# Patient Record
Sex: Male | Born: 1935 | Race: Black or African American | Hispanic: No | Smoking: Current some day smoker
Health system: Southern US, Community
[De-identification: ages and names within clinical notes are randomized; demographics above are authoritative.]

## PROBLEM LIST (undated history)

## (undated) DIAGNOSIS — I739 Peripheral vascular disease, unspecified: Secondary | ICD-10-CM

## (undated) DIAGNOSIS — M109 Gout, unspecified: Secondary | ICD-10-CM

## (undated) DIAGNOSIS — I5030 Unspecified diastolic (congestive) heart failure: Secondary | ICD-10-CM

## (undated) DIAGNOSIS — M1990 Unspecified osteoarthritis, unspecified site: Secondary | ICD-10-CM

## (undated) DIAGNOSIS — E785 Hyperlipidemia, unspecified: Secondary | ICD-10-CM

## (undated) DIAGNOSIS — C801 Malignant (primary) neoplasm, unspecified: Secondary | ICD-10-CM

## (undated) DIAGNOSIS — Z9849 Cataract extraction status, unspecified eye: Secondary | ICD-10-CM

## (undated) DIAGNOSIS — R972 Elevated prostate specific antigen [PSA]: Secondary | ICD-10-CM

## (undated) DIAGNOSIS — N419 Inflammatory disease of prostate, unspecified: Secondary | ICD-10-CM

## (undated) DIAGNOSIS — N178 Other acute kidney failure: Secondary | ICD-10-CM

## (undated) DIAGNOSIS — Z8521 Personal history of malignant neoplasm of larynx: Secondary | ICD-10-CM

## (undated) DIAGNOSIS — R627 Adult failure to thrive: Secondary | ICD-10-CM

## (undated) DIAGNOSIS — I10 Essential (primary) hypertension: Secondary | ICD-10-CM

## (undated) DIAGNOSIS — K270 Acute peptic ulcer, site unspecified, with hemorrhage: Secondary | ICD-10-CM

## (undated) DIAGNOSIS — Z9861 Coronary angioplasty status: Secondary | ICD-10-CM

## (undated) DIAGNOSIS — J811 Chronic pulmonary edema: Secondary | ICD-10-CM

## (undated) HISTORY — DX: Coronary angioplasty status: Z98.61

## (undated) HISTORY — DX: Personal history of malignant neoplasm of larynx: Z85.21

## (undated) HISTORY — DX: Inflammatory disease of prostate, unspecified: N41.9

## (undated) HISTORY — PX: CATARACT EXTRACTION: SUR2

## (undated) HISTORY — DX: Unspecified osteoarthritis, unspecified site: M19.90

## (undated) HISTORY — DX: Cataract extraction status, unspecified eye: Z98.49

## (undated) HISTORY — DX: Acute peptic ulcer, site unspecified, with hemorrhage: K27.0

## (undated) HISTORY — DX: Elevated prostate specific antigen (PSA): R97.20

## (undated) HISTORY — PX: OTHER SURGICAL HISTORY: SHX169

## (undated) HISTORY — DX: Peripheral vascular disease, unspecified: I73.9

## (undated) HISTORY — DX: Essential (primary) hypertension: I10

## (undated) HISTORY — DX: Other acute kidney failure: N17.8

## (undated) HISTORY — DX: Gout, unspecified: M10.9

---

## 1999-01-26 ENCOUNTER — Emergency Department (HOSPITAL_COMMUNITY): Admission: EM | Admit: 1999-01-26 | Discharge: 1999-01-26 | Payer: Self-pay | Admitting: Emergency Medicine

## 1999-01-26 ENCOUNTER — Encounter: Payer: Self-pay | Admitting: Emergency Medicine

## 1999-05-14 ENCOUNTER — Encounter: Payer: Self-pay | Admitting: Internal Medicine

## 2002-02-22 ENCOUNTER — Emergency Department (HOSPITAL_COMMUNITY): Admission: EM | Admit: 2002-02-22 | Discharge: 2002-02-22 | Payer: Self-pay | Admitting: Emergency Medicine

## 2003-02-08 ENCOUNTER — Encounter: Admission: RE | Admit: 2003-02-08 | Discharge: 2003-02-08 | Payer: Self-pay | Admitting: Otolaryngology

## 2003-02-08 ENCOUNTER — Encounter: Payer: Self-pay | Admitting: Otolaryngology

## 2003-02-13 ENCOUNTER — Encounter (INDEPENDENT_AMBULATORY_CARE_PROVIDER_SITE_OTHER): Payer: Self-pay | Admitting: *Deleted

## 2003-02-13 ENCOUNTER — Ambulatory Visit (HOSPITAL_BASED_OUTPATIENT_CLINIC_OR_DEPARTMENT_OTHER): Admission: RE | Admit: 2003-02-13 | Discharge: 2003-02-13 | Payer: Self-pay | Admitting: Otolaryngology

## 2003-02-21 ENCOUNTER — Ambulatory Visit: Admission: RE | Admit: 2003-02-21 | Discharge: 2003-05-06 | Payer: Self-pay | Admitting: Radiation Oncology

## 2003-02-28 ENCOUNTER — Ambulatory Visit (HOSPITAL_COMMUNITY): Admission: RE | Admit: 2003-02-28 | Discharge: 2003-02-28 | Payer: Self-pay | Admitting: Radiation Oncology

## 2003-05-29 ENCOUNTER — Ambulatory Visit: Admission: RE | Admit: 2003-05-29 | Discharge: 2003-05-29 | Payer: Self-pay

## 2003-07-31 ENCOUNTER — Ambulatory Visit: Admission: RE | Admit: 2003-07-31 | Discharge: 2003-07-31 | Payer: Self-pay | Admitting: Radiation Oncology

## 2003-08-09 ENCOUNTER — Ambulatory Visit (HOSPITAL_COMMUNITY): Admission: RE | Admit: 2003-08-09 | Discharge: 2003-08-09 | Payer: Self-pay | Admitting: Radiation Oncology

## 2003-09-03 ENCOUNTER — Ambulatory Visit: Admission: RE | Admit: 2003-09-03 | Discharge: 2003-09-03 | Payer: Self-pay | Admitting: Radiation Oncology

## 2003-10-02 ENCOUNTER — Ambulatory Visit (HOSPITAL_COMMUNITY): Admission: RE | Admit: 2003-10-02 | Discharge: 2003-10-02 | Payer: Self-pay | Admitting: Cardiovascular Disease

## 2003-11-12 ENCOUNTER — Ambulatory Visit: Admission: RE | Admit: 2003-11-12 | Discharge: 2003-11-12 | Payer: Self-pay | Admitting: Radiation Oncology

## 2003-12-11 ENCOUNTER — Ambulatory Visit (HOSPITAL_COMMUNITY): Admission: RE | Admit: 2003-12-11 | Discharge: 2003-12-12 | Payer: Self-pay | Admitting: Cardiovascular Disease

## 2003-12-17 ENCOUNTER — Ambulatory Visit: Admission: RE | Admit: 2003-12-17 | Discharge: 2003-12-17 | Payer: Self-pay | Admitting: Radiation Oncology

## 2003-12-24 ENCOUNTER — Ambulatory Visit: Admission: RE | Admit: 2003-12-24 | Discharge: 2003-12-24 | Payer: Self-pay | Admitting: Radiation Oncology

## 2004-02-18 ENCOUNTER — Ambulatory Visit: Admission: RE | Admit: 2004-02-18 | Discharge: 2004-02-18 | Payer: Self-pay | Admitting: Radiation Oncology

## 2004-06-25 ENCOUNTER — Ambulatory Visit: Admission: RE | Admit: 2004-06-25 | Discharge: 2004-06-25 | Payer: Self-pay | Admitting: Radiation Oncology

## 2004-09-30 ENCOUNTER — Ambulatory Visit: Admission: RE | Admit: 2004-09-30 | Discharge: 2004-09-30 | Payer: Self-pay | Admitting: Radiation Oncology

## 2004-10-13 ENCOUNTER — Ambulatory Visit: Admission: RE | Admit: 2004-10-13 | Discharge: 2004-10-13 | Payer: Self-pay | Admitting: Radiation Oncology

## 2004-11-06 ENCOUNTER — Ambulatory Visit: Payer: Self-pay | Admitting: Internal Medicine

## 2004-12-04 ENCOUNTER — Ambulatory Visit: Admission: RE | Admit: 2004-12-04 | Discharge: 2004-12-04 | Payer: Self-pay | Admitting: Radiation Oncology

## 2005-06-15 ENCOUNTER — Encounter: Payer: Self-pay | Admitting: Emergency Medicine

## 2005-06-15 ENCOUNTER — Inpatient Hospital Stay (HOSPITAL_COMMUNITY): Admission: AD | Admit: 2005-06-15 | Discharge: 2005-07-03 | Payer: Self-pay | Admitting: Internal Medicine

## 2005-06-16 ENCOUNTER — Ambulatory Visit: Payer: Self-pay | Admitting: Gastroenterology

## 2005-06-16 ENCOUNTER — Encounter (INDEPENDENT_AMBULATORY_CARE_PROVIDER_SITE_OTHER): Payer: Self-pay | Admitting: *Deleted

## 2005-06-20 ENCOUNTER — Ambulatory Visit: Payer: Self-pay | Admitting: Internal Medicine

## 2005-06-23 ENCOUNTER — Encounter (INDEPENDENT_AMBULATORY_CARE_PROVIDER_SITE_OTHER): Payer: Self-pay | Admitting: *Deleted

## 2005-06-23 HISTORY — PX: OTHER SURGICAL HISTORY: SHX169

## 2005-06-23 HISTORY — PX: TRUNCAL VAGOTOMY: SHX2582

## 2005-06-23 HISTORY — PX: CHOLECYSTECTOMY: SHX55

## 2005-07-06 ENCOUNTER — Emergency Department (HOSPITAL_COMMUNITY): Admission: EM | Admit: 2005-07-06 | Discharge: 2005-07-06 | Payer: Self-pay | Admitting: Emergency Medicine

## 2005-07-13 ENCOUNTER — Ambulatory Visit: Payer: Self-pay | Admitting: Internal Medicine

## 2005-07-14 ENCOUNTER — Ambulatory Visit: Payer: Self-pay | Admitting: Internal Medicine

## 2005-07-31 ENCOUNTER — Ambulatory Visit: Payer: Self-pay | Admitting: Endocrinology

## 2005-08-08 ENCOUNTER — Observation Stay (HOSPITAL_COMMUNITY): Admission: EM | Admit: 2005-08-08 | Discharge: 2005-08-09 | Payer: Self-pay | Admitting: Emergency Medicine

## 2005-08-08 ENCOUNTER — Ambulatory Visit: Payer: Self-pay | Admitting: Internal Medicine

## 2006-01-12 ENCOUNTER — Ambulatory Visit: Payer: Self-pay | Admitting: Internal Medicine

## 2006-02-10 ENCOUNTER — Inpatient Hospital Stay (HOSPITAL_COMMUNITY): Admission: EM | Admit: 2006-02-10 | Discharge: 2006-02-11 | Payer: Self-pay | Admitting: Emergency Medicine

## 2006-02-10 ENCOUNTER — Ambulatory Visit: Payer: Self-pay | Admitting: Internal Medicine

## 2006-02-11 IMAGING — CR DG CHEST 1V PORT
1 series · 1 of 1 positions shown · non-contrast
Comparison: Chest radiographs 06/30/05.

CLINICAL DATA: Recent abdominal surgery, abdominal pain, hypertension. 
 PORTABLE CHEST ? 07/06/05:
 Single semiupright view only.

[view not recorded]
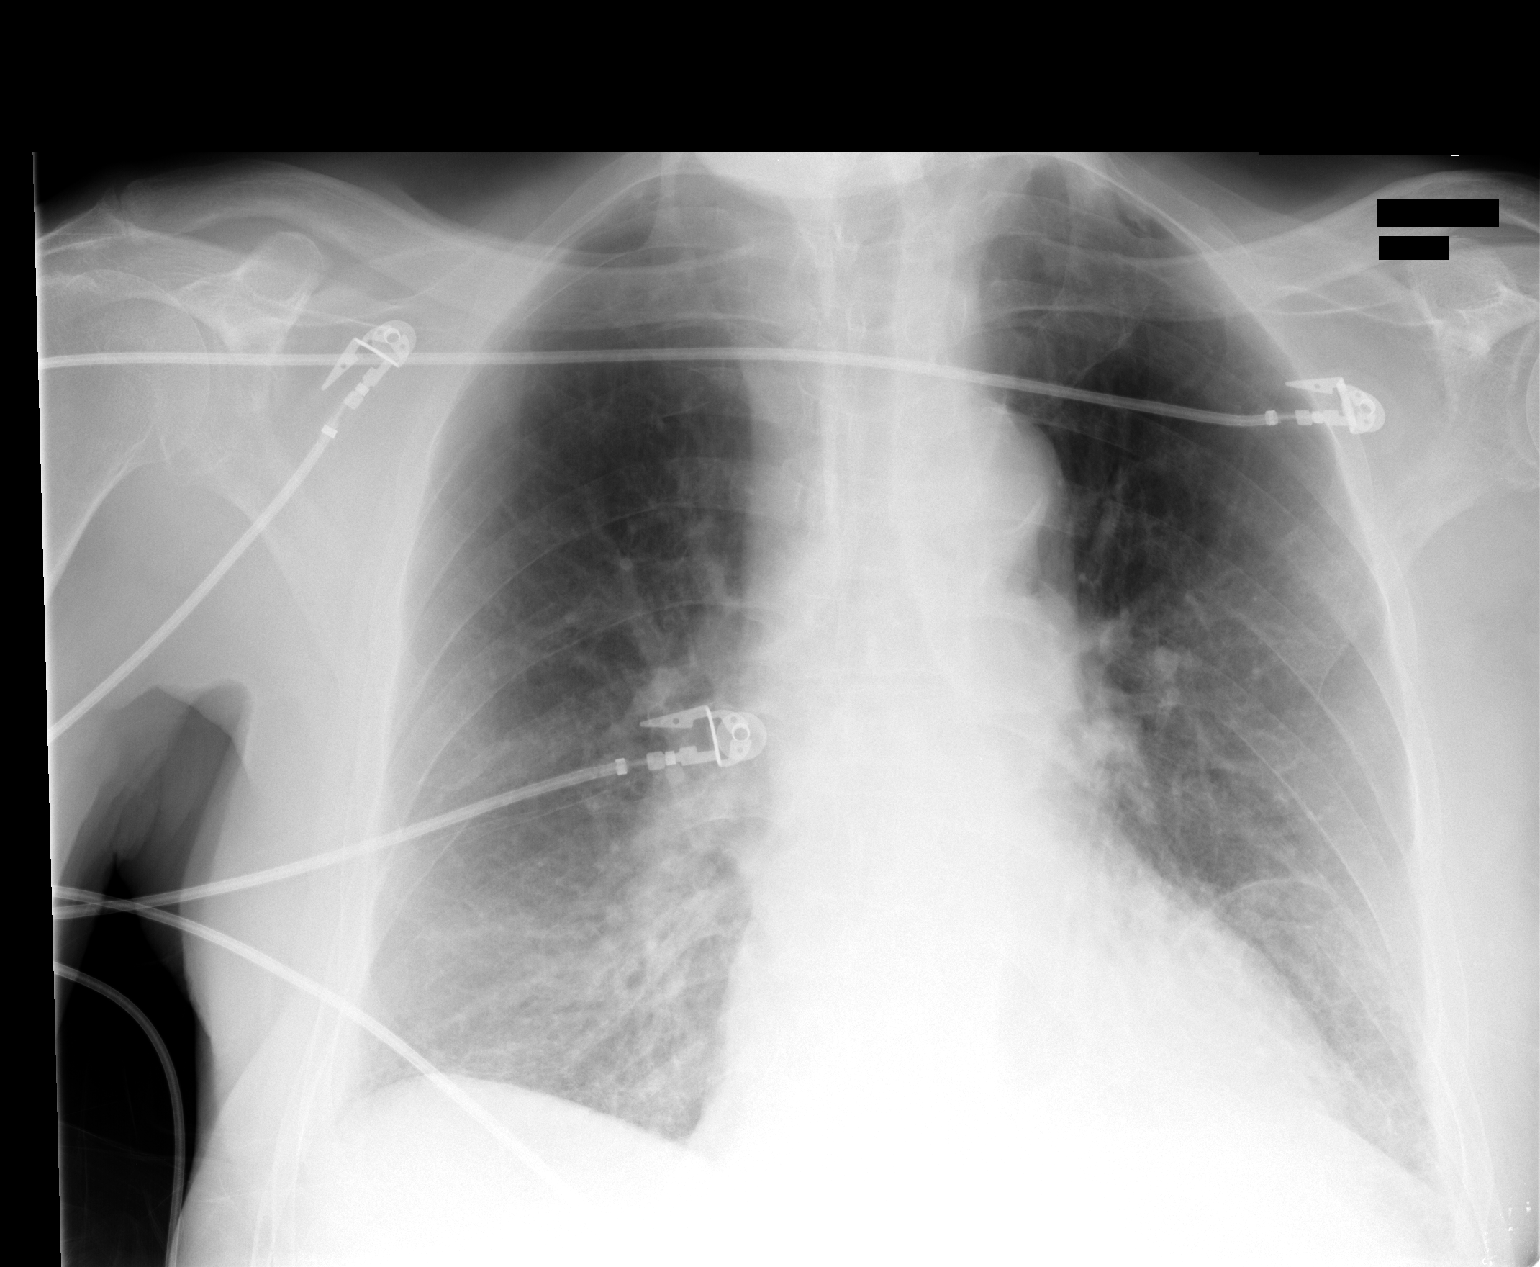

[1 of 1 positions shown; findings below may reference images not displayed]

FINDINGS: Cardiomediastinal silhouette is within normal limits. There has been improved aeration of both lungs since the prior study with persistent minimal subsegmental atelectasis at both bases. No pleural effusion is seen.   No pneumothorax is evident.  Osseous structures are grossly intact.
IMPRESSION: Improved aeration of the lungs since the prior study with bibasilar subsegmental atelectasis persisting.

## 2006-02-18 ENCOUNTER — Encounter: Payer: Self-pay | Admitting: Vascular Surgery

## 2006-02-18 ENCOUNTER — Ambulatory Visit: Payer: Self-pay | Admitting: Internal Medicine

## 2006-02-18 ENCOUNTER — Ambulatory Visit (HOSPITAL_COMMUNITY): Admission: RE | Admit: 2006-02-18 | Discharge: 2006-02-18 | Payer: Self-pay | Admitting: Orthopedic Surgery

## 2006-03-04 ENCOUNTER — Inpatient Hospital Stay (HOSPITAL_COMMUNITY): Admission: EM | Admit: 2006-03-04 | Discharge: 2006-03-05 | Payer: Self-pay | Admitting: Emergency Medicine

## 2006-03-05 ENCOUNTER — Encounter: Payer: Self-pay | Admitting: Vascular Surgery

## 2006-04-30 ENCOUNTER — Ambulatory Visit: Payer: Self-pay | Admitting: Internal Medicine

## 2006-06-21 ENCOUNTER — Ambulatory Visit: Payer: Self-pay | Admitting: Internal Medicine

## 2006-09-17 IMAGING — CR DG CHEST 1V PORT
1 series · 1 of 1 positions shown · non-contrast
Comparison: 07/06/05.

CLINICAL DATA: Chest pain.
 PORTABLE CHEST ? 1 VIEW:

[view not recorded]
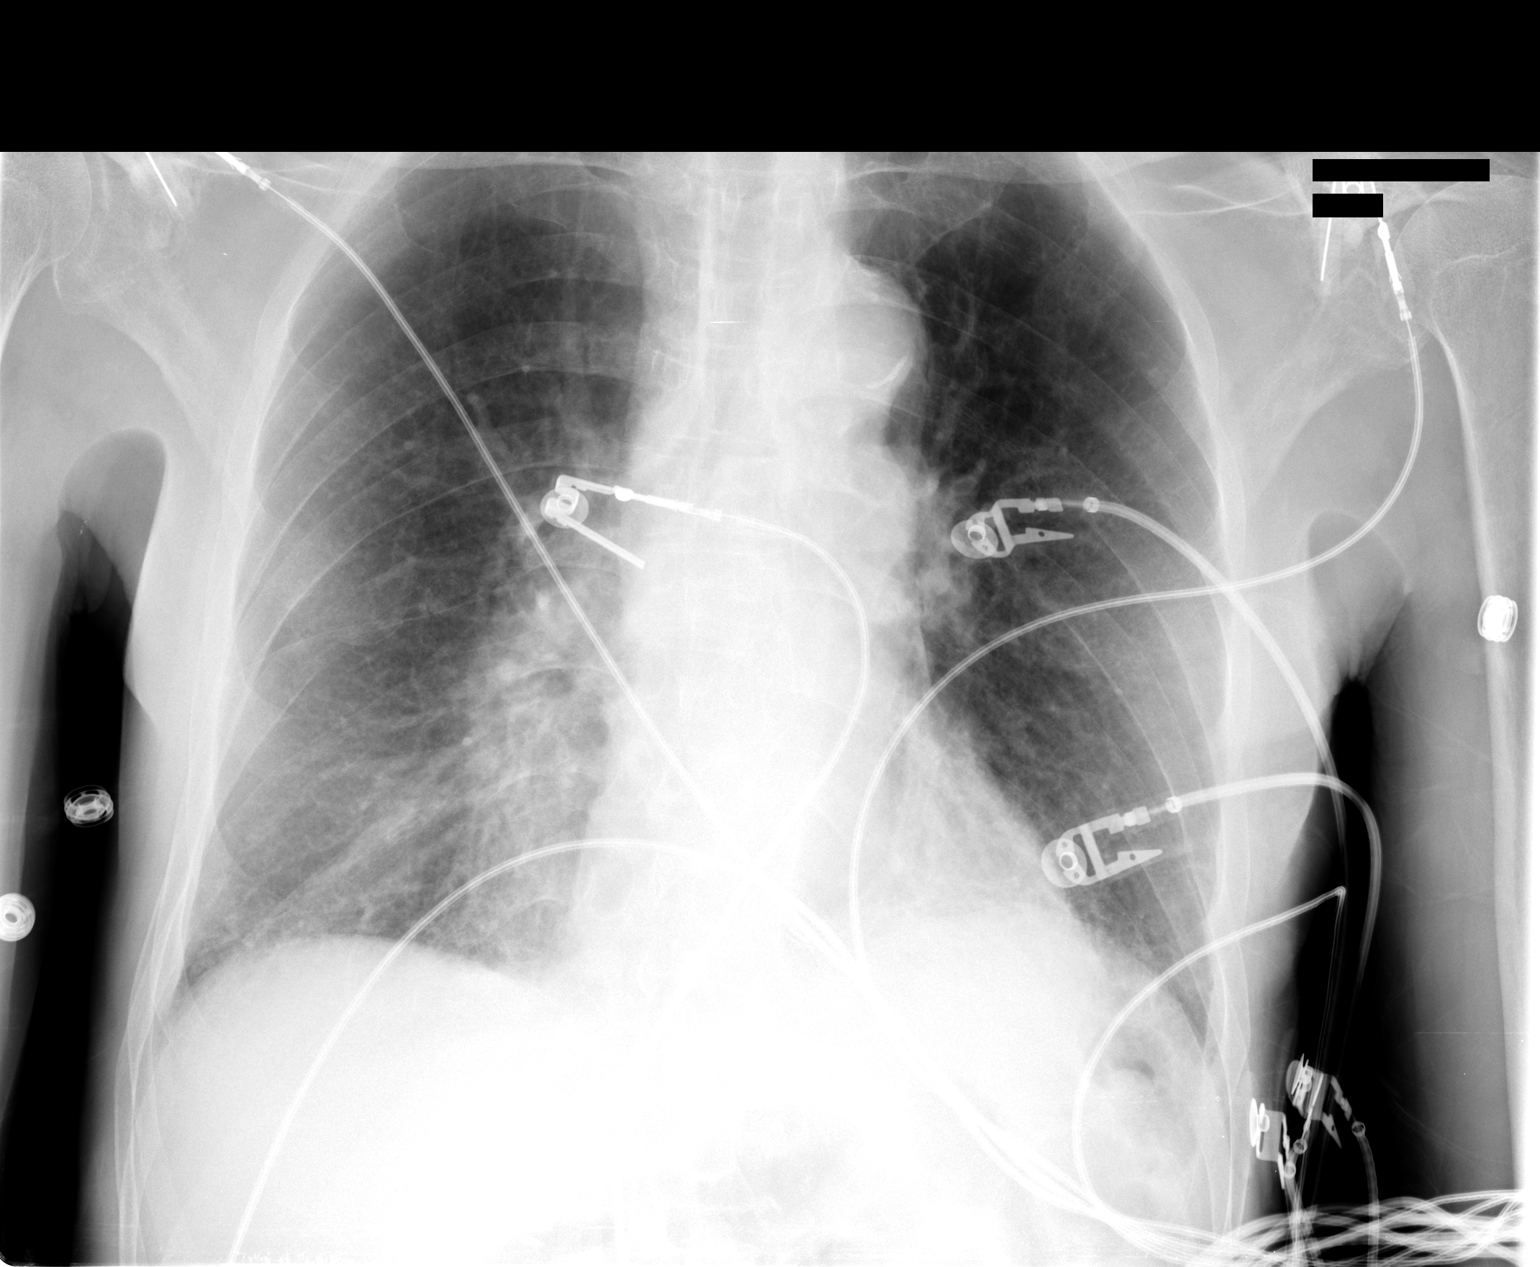

[1 of 1 positions shown; findings below may reference images not displayed]

FINDINGS: Left lower lobe scarring is again seen.  Changes of COPD are again noted.  Heart size and mediastinal contours are stable.  There is no evidence of acute infiltrate or pleural effusion.
IMPRESSION: No acute findings.  Stable left lower lobe scarring, COPD and mild cardiomegaly.

## 2006-12-31 ENCOUNTER — Ambulatory Visit: Payer: Self-pay | Admitting: Internal Medicine

## 2007-03-09 ENCOUNTER — Inpatient Hospital Stay (HOSPITAL_COMMUNITY): Admission: EM | Admit: 2007-03-09 | Discharge: 2007-03-13 | Payer: Self-pay | Admitting: Emergency Medicine

## 2007-03-10 ENCOUNTER — Ambulatory Visit: Payer: Self-pay | Admitting: Internal Medicine

## 2007-03-29 ENCOUNTER — Inpatient Hospital Stay (HOSPITAL_COMMUNITY): Admission: EM | Admit: 2007-03-29 | Discharge: 2007-03-31 | Payer: Self-pay | Admitting: Emergency Medicine

## 2007-03-29 ENCOUNTER — Encounter: Payer: Self-pay | Admitting: Internal Medicine

## 2007-04-06 ENCOUNTER — Ambulatory Visit: Payer: Self-pay | Admitting: Internal Medicine

## 2007-04-15 ENCOUNTER — Ambulatory Visit: Payer: Self-pay | Admitting: Internal Medicine

## 2007-04-20 ENCOUNTER — Ambulatory Visit: Payer: Self-pay | Admitting: Internal Medicine

## 2007-05-11 ENCOUNTER — Ambulatory Visit: Admission: RE | Admit: 2007-05-11 | Discharge: 2007-05-11 | Payer: Self-pay | Admitting: Internal Medicine

## 2007-05-11 ENCOUNTER — Ambulatory Visit: Payer: Self-pay | Admitting: Internal Medicine

## 2007-05-11 ENCOUNTER — Encounter: Payer: Self-pay | Admitting: Internal Medicine

## 2007-08-05 ENCOUNTER — Encounter: Payer: Self-pay | Admitting: Internal Medicine

## 2007-08-05 DIAGNOSIS — R972 Elevated prostate specific antigen [PSA]: Secondary | ICD-10-CM | POA: Insufficient documentation

## 2007-08-05 DIAGNOSIS — Z8521 Personal history of malignant neoplasm of larynx: Secondary | ICD-10-CM

## 2007-08-05 DIAGNOSIS — Z9849 Cataract extraction status, unspecified eye: Secondary | ICD-10-CM

## 2007-08-05 DIAGNOSIS — I10 Essential (primary) hypertension: Secondary | ICD-10-CM | POA: Insufficient documentation

## 2007-08-05 DIAGNOSIS — I739 Peripheral vascular disease, unspecified: Secondary | ICD-10-CM

## 2007-08-05 DIAGNOSIS — Z9861 Coronary angioplasty status: Secondary | ICD-10-CM

## 2007-08-05 DIAGNOSIS — Z87448 Personal history of other diseases of urinary system: Secondary | ICD-10-CM | POA: Insufficient documentation

## 2007-08-05 DIAGNOSIS — M109 Gout, unspecified: Secondary | ICD-10-CM | POA: Insufficient documentation

## 2007-10-20 ENCOUNTER — Observation Stay (HOSPITAL_COMMUNITY): Admission: EM | Admit: 2007-10-20 | Discharge: 2007-10-21 | Payer: Self-pay | Admitting: Emergency Medicine

## 2007-10-20 ENCOUNTER — Ambulatory Visit: Payer: Self-pay | Admitting: Internal Medicine

## 2007-10-27 ENCOUNTER — Ambulatory Visit: Payer: Self-pay | Admitting: Internal Medicine

## 2007-10-27 DIAGNOSIS — N178 Other acute kidney failure: Secondary | ICD-10-CM | POA: Insufficient documentation

## 2007-10-27 LAB — CONVERTED CEMR LAB
CO2: 26 meq/L (ref 19–32)
Chloride: 102 meq/L (ref 96–112)
Creatinine, Ser: 1.7 mg/dL — ABNORMAL HIGH (ref 0.4–1.5)
Sodium: 133 meq/L — ABNORMAL LOW (ref 135–145)

## 2007-10-31 ENCOUNTER — Encounter: Payer: Self-pay | Admitting: Internal Medicine

## 2007-11-09 ENCOUNTER — Encounter: Payer: Self-pay | Admitting: Internal Medicine

## 2008-03-13 ENCOUNTER — Encounter: Payer: Self-pay | Admitting: Internal Medicine

## 2008-04-25 ENCOUNTER — Telehealth: Payer: Self-pay | Admitting: Internal Medicine

## 2008-07-05 ENCOUNTER — Ambulatory Visit: Payer: Self-pay | Admitting: Internal Medicine

## 2008-07-09 ENCOUNTER — Ambulatory Visit: Payer: Self-pay | Admitting: Internal Medicine

## 2008-10-17 ENCOUNTER — Encounter: Payer: Self-pay | Admitting: Internal Medicine

## 2009-04-12 ENCOUNTER — Encounter: Payer: Self-pay | Admitting: Internal Medicine

## 2009-05-15 ENCOUNTER — Encounter: Payer: Self-pay | Admitting: Internal Medicine

## 2009-05-29 ENCOUNTER — Emergency Department (HOSPITAL_COMMUNITY): Admission: EM | Admit: 2009-05-29 | Discharge: 2009-05-29 | Payer: Self-pay | Admitting: Emergency Medicine

## 2009-05-30 ENCOUNTER — Ambulatory Visit: Payer: Self-pay | Admitting: Internal Medicine

## 2009-06-06 ENCOUNTER — Ambulatory Visit (HOSPITAL_COMMUNITY): Admission: RE | Admit: 2009-06-06 | Discharge: 2009-06-06 | Payer: Self-pay | Admitting: Podiatrist

## 2009-10-16 ENCOUNTER — Encounter: Payer: Self-pay | Admitting: Internal Medicine

## 2010-04-08 ENCOUNTER — Encounter: Payer: Self-pay | Admitting: Internal Medicine

## 2010-06-17 ENCOUNTER — Telehealth: Payer: Self-pay | Admitting: Internal Medicine

## 2010-09-15 ENCOUNTER — Telehealth: Payer: Self-pay | Admitting: Internal Medicine

## 2010-10-07 ENCOUNTER — Encounter: Payer: Self-pay | Admitting: Internal Medicine

## 2010-10-21 ENCOUNTER — Ambulatory Visit: Payer: Self-pay | Admitting: Internal Medicine

## 2010-10-21 DIAGNOSIS — J069 Acute upper respiratory infection, unspecified: Secondary | ICD-10-CM | POA: Insufficient documentation

## 2010-10-21 DIAGNOSIS — Z8711 Personal history of peptic ulcer disease: Secondary | ICD-10-CM

## 2010-11-11 ENCOUNTER — Encounter: Payer: Self-pay | Admitting: Internal Medicine

## 2010-11-28 ENCOUNTER — Telehealth: Payer: Self-pay | Admitting: Internal Medicine

## 2010-12-25 NOTE — Progress Notes (Signed)
  Phone Note Refill Request Message from:  Fax from Pharmacy on September 15, 2010 4:54 PM  Refills Requested: Medication #1:  ALLOPURINOL 300 MG  TABS take one tablet once daily Initial call taken by: Ami Bullins CMA,  September 15, 2010 4:54 PM    Prescriptions: ALLOPURINOL 300 MG  TABS (ALLOPURINOL) take one tablet once daily  #30 x 2   Entered by:   Ami Bullins CMA   Authorized by:   Jacques Navy MD   Signed by:   Bill Salinas CMA on 09/15/2010   Method used:   Faxed to ...       [facility] (retail)       [address]       [place], [place]  [address]       Ph: [phone]       Fax: [phone]   RxID:   [id number]

## 2010-12-25 NOTE — Progress Notes (Signed)
  Phone Note Refill Request      Prescriptions: ALLOPURINOL 300 MG  TABS (ALLOPURINOL) take one tablet once daily  #30 x 2   Entered by:   Ami Bullins CMA   Authorized by:   Jacques Navy MD   Signed by:   Bill Salinas CMA on 11/28/2010   Method used:   Faxed to ...       [facility] (retail)       [address]       [place], [place]  [address]       Ph: [phone]       Fax: [phone]   RxID:   [id number]

## 2010-12-25 NOTE — Progress Notes (Signed)
  Phone Note Refill Request   Refills Requested: Medication #1:  ALLOPURINOL 300 MG  TABS take one tablet once daily Initial call taken by: Ami Bullins CMA,  June 17, 2010 11:42 AM    Prescriptions: ALLOPURINOL 300 MG  TABS (ALLOPURINOL) take one tablet once daily  #30 x 2   Entered by:   Ami Bullins CMA   Authorized by:   Jacques Navy MD   Signed by:   Bill Salinas CMA on 06/17/2010   Method used:   Faxed to ...       [facility] (retail)       [address]       [place], [place]  [address]       Ph: [phone]       Fax: [phone]   RxID:   [id number]

## 2010-12-25 NOTE — Assessment & Plan Note (Signed)
Summary: COLD/ LOOSING WEIGHT/NWS   Vital Signs:  Patient profile:   77 year old male Height:      69 inches Weight:      149 pounds BMI:     22.08 O2 Sat:      96 % on Room air Temp:     96.9 degrees F oral Pulse rate:   97 / minute BP sitting:   110 / 56  (left arm) Cuff size:   large  Vitals Entered By: Bill Salinas CMA (October 21, 2010 8:52 AM)  O2 Flow:  Room air CC: pt here with c/o head congestion, runny nose, productive cough (producing yellow mucous) and loss of appetite x 2 weeks/ ab Comments pt states he is no longer taking Klor-con, furosemide or Meloxicam/ ab   Primary Care Provider:  Jacques Navy MD  CC:  pt here with c/o head congestion, runny nose, and productive cough (producing yellow mucous) and loss of appetite x 2 weeks/ ab.  History of Present Illness: Patient presents for evaluation of a 10 day h/o cough productive of hyellow mucus along with colored rhinorrhea, no documented fever, mild SOB, pressure in the sinuses,  he has had N/V.Marland Kitchen He has been taking tylenol cold.  He is c/o weight loss since surgery and radiation of the neck. He has lost his appetite. Reveiwed chart - weighed 150 lb July '10 now 149lbs. He reports loss of taste and thus pleasure in eating.  He does remain active - walking most days. He is independent in his ADLs. He does manage his own affiars: cashes his check, buys his gorceries and pays his bills. He has no signs of depression and denies any symptoms. He has risk of falls at his advanced age but is very careful and thoughtful about ambulation and has not had any falls.    Preventive Screening-Counseling & Management  Alcohol-Tobacco     Alcohol drinks/day: 4     Alcohol type: spirits     Alcohol Counseling: not indicated; use of alcohol is not excessive or problematic     Feels need to cut down: no     Feels annoyed by complaints: no     Smoking Status: quit     Pack years: 20+  Caffeine-Diet-Exercise     Caffeine  use/day: 1-2 cps per day     Diet Comments: poor diet - low in calories     Diet Counseling: to improve diet; diet is suboptimal     Does Patient Exercise: no  Hep-HIV-STD-Contraception     Hepatitis Risk: no risk noted     HIV Risk: no risk noted     STD Risk: no risk noted     Dental Visit-last 6 months no     Sun Exposure-Excessive: no  Safety-Violence-Falls     Seat Belt Use: yes     Helmet Use: n/a     Smoke Detectors: yes     Violence in the Home: no risk noted     Sexual Abuse: no     Fall Risk: yes     Fall Risk Counseling: counseling provided; falls with injury noted      Drug Use:  former.        Blood Transfusions:  yes and after 1987.    Current Medications (verified): 1)  Allopurinol 300 Mg  Tabs (Allopurinol) .... Take One Tablet Once Daily 2)  Aspirin 81 Mg  Tabs (Aspirin) .... Take One Tablet Daily 3)  Klor-Con 20 Meq  Pack (Potassium Chloride) .... Take One Tablet Once Daily 4)  Furosemide 40 Mg  Tabs (Furosemide) .... Take One Tablet Daily 5)  Metoprolol Succinate 100 Mg  Xr24h-Tab (Metoprolol Succinate) .... Take 1 1/2 Tablet By Mouth Once A Day 6)  Meloxicam 15 Mg  Tabs (Meloxicam) .Marland Kitchen.. 1 By Mouth Once Daily For Foot Pain 7)  Amlodipine Besylate 10 Mg Tabs (Amlodipine Besylate) .Marland Kitchen.. 1 By Mouth Once Daily 8)  Cozaar 50 Mg Tabs (Losartan Potassium) .Marland Kitchen.. 1 By Mouth Once Daily 9)  Megestrol Acetate 400 Mg/37ml Susp (Megestrol Acetate) .Marland Kitchen.. 1 Tsp Daily For Appetite Stimulation 10)  Azithromycin 250 Mg Tabs (Azithromycin) .... 2 Tabs Day #1, Then 1 Tab Days #2-5  Allergies (verified): No Known Drug Allergies  Past History:  Family History: Last updated: 2008-07-08 Father: died at 22 from heart problems Mother: died at 45 from [place] (no medical problems) 5 Siblings: Brother (died of cancer at 60), others unknown  Past Medical History: Current Problems:  OSTEOARTHROS UNSPEC GEN/LOC OTH SPEC SITES (ICD-715.98) ACUTE RENAL FAIL W/OTH SPEC PATHAL LESION  KIDNEY (ICD-584.8) PROSTATITIS, HX OF (ICD-V13.09) PERCUTANEOUS TRANSLUMINAL CORONARY ANGIOPLASTY, HX OF (ICD-V45.82) CATARACT EXTRACTION, HX OF (ICD-V45.61) Hx of PERIPHERAL VASCULAR DISEASE (ICD-443.9) NEOPLASM, MALIGNANT, LARYNX, HX OF (ICD-V10.21) PSA, INCREASED (ICD-790.93) GOUT (ICD-274.9) PEPTIC ULCER, ACUTE, HEMORRHAGE, HX OF (ICD-V12.71) HYPERTENSION (ICD-401.9)      Past Surgical History: PERCUTANEOUS TRANSLUMINAL CORONARY ANGIOPLASTY, HX OF (ICD-V45.82) CATARACT EXTRACTION, HX OF (ICD-V45.61) TRUNCAL VAGOTOMY/GASTROJEJUNOSOTOMY/CHOLECYSTECTOMY Aug '06 ([facility])  Social History: 8th Grade education Divorced in 2004, Married in 1969, Married in 1949 Retired, 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 End-of-life: wants CPR but no prolonged heroic measures.  Smoking Status:  quit Caffeine use/day:  1-2 cps per day Does Patient Exercise:  no Hepatitis Risk:  no risk noted HIV Risk:  no risk noted STD Risk:  no risk noted Dental Care w/in 6 mos.:  no Sun Exposure-Excessive:  no Seat Belt Use:  yes Fall Risk:  yes Drug Use:  former Blood Transfusions:  yes, after 1987  Review of Systems  The patient denies anorexia, fever, weight loss, weight gain, decreased hearing, chest pain, syncope, prolonged cough, abdominal pain, severe indigestion/heartburn, incontinence, transient blindness, difficulty walking, abnormal bleeding, and enlarged lymph nodes.    Physical Exam  General:  Tall, thin and gaunt AA male in no distress Head:  normocephalic and atraumatic.   Eyes:  vision grossly intact, pupils equal, and pupils round. Scleral clear  Mouth:  edentualous - dentures appear loose Neck:  supple, full ROM, and no thyromegaly.   Chest Wall:  no deformities.   Lungs:  normal respiratory effort, normal breath sounds, and no wheezes.   Heart:  normal rate and regular rhythm.   Abdomen:  normal bowel sounds.   Msk:  no  joint tenderness, no joint swelling, and no redness over joints.   Pulses:  2+ radial Neurologic:  alert & oriented X3, cranial nerves II-XII intact, strength normal in all extremities, and gait normal.   Skin:  turgor normal, color normal, and no suspicious lesions.   Psych:  Oriented X3, memory intact for recent and remote, normally interactive, good eye contact, and not anxious appearing.     Impression & Recommendations:  Problem # 1:  PEPTIC ULCER, ACUTE, HEMORRHAGE, HX OF (ICD-V12.71) Reviewed history. He denies any GI symptoms. Currently takes no acid reducing medications. He does take aspirin and NSAIDs with no complatins.  Plan -  he is advised to watch for any signs of gastric irritatiion given his h/o PUD  Problem # 2:  PERCUTANEOUS TRANSLUMINAL CORONARY ANGIOPLASTY, HX OF (ICD-V45.82) Seems quite stable with no c/o chest pain or discomfort. Last saw Dr. Alanda Amass May '11. By tht correspondence he is doing well. He had a recent echo that was OK. There was mention in Dr. Kandis Cocking note of scheduling myoview stress but no report available.  Plan - continue present medications.  His updated medication list for this problem includes:    Aspirin 81 Mg Tabs (Aspirin) .Marland Kitchen... Take one tablet daily    Furosemide 40 Mg Tabs (Furosemide) .Marland Kitchen... Take one tablet daily    Metoprolol Succinate 100 Mg Xr24h-tab (Metoprolol succinate) .Marland Kitchen... Take 1 1/2 tablet by mouth once a day    Amlodipine Besylate 10 Mg Tabs (Amlodipine besylate) .Marland Kitchen... 1 by mouth once daily    Cozaar 50 Mg Tabs (Losartan potassium) .Marland Kitchen... 1 by mouth once daily  Problem # 3:  NEOPLASM, MALIGNANT, LARYNX, HX OF (ICD-V10.21) S/P XRT and evidently doing well except for the change in taste leading to loss of appetite. Although he is concerned about weight loss no real weight loss is documented.  Plan - f/o with oncology/ENT as instructed           Start megace 400mg  daily for appetite           Supplement diet with carnation  instant breakfast or the equivalent.  Problem # 4:  GOUT (ICD-274.9) No recent flares.   Plan - continue allopurinol  His updated medication list for this problem includes:    Allopurinol 300 Mg Tabs (Allopurinol) .Marland Kitchen... Take one tablet once daily  Problem # 5:  HYPERTENSION (ICD-401.9)  His updated medication list for this problem includes:    Furosemide 40 Mg Tabs (Furosemide) .Marland Kitchen... Take one tablet daily    Metoprolol Succinate 100 Mg Xr24h-tab (Metoprolol succinate) .Marland Kitchen... Take 1 1/2 tablet by mouth once a day    Amlodipine Besylate 10 Mg Tabs (Amlodipine besylate) .Marland Kitchen... 1 by mouth once daily    Cozaar 50 Mg Tabs (Losartan potassium) .Marland Kitchen... 1 by mouth once daily  BP today: 110/56 Prior BP: 124/82 (05/30/2009)  Labs Reviewed: K+: 5.6 (10/27/2007) Creat: : 1.7 (10/27/2007)    More recent labs with [facility]  Good control. Plan is to contine present medications.  Problem # 6:  URI (ICD-465.9) Mildly symptomaic.  Plan - z-pak           supportive care.  His updated medication list for this problem includes:    Aspirin 81 Mg Tabs (Aspirin) .Marland Kitchen... Take one tablet daily    Meloxicam 15 Mg Tabs (Meloxicam) .Marland Kitchen... 1 by mouth once daily for foot pain  Problem # 7:  Preventive Health Care (ICD-V70.0)  Interval history is medically stable with appropriate follow-up by his subspecialists. His exam does reveal a somewhat gaunt man but he actually looks younger than his age of 73. No labs are drawn today. He has no EMR recorded immunizations and is a candidate for pneumonia vaccine if not done in hospital, flu shot.   In summary - a very pleasant gentleman who does appear to medically stable at this time except for URI symptoms. He will return as needed.   Orders: Medicare -1st Annual Wellness Visit [phone])  Complete Medication List: 1)  Allopurinol 300 Mg Tabs (Allopurinol) .... Take one tablet once daily 2)  Aspirin 81 Mg Tabs (Aspirin) .... Take one tablet daily 3)  Klor-con  20 Meq  Pack (Potassium chloride) .... Take one tablet once daily 4)  Furosemide 40 Mg Tabs (Furosemide) .... Take one tablet daily 5)  Metoprolol Succinate 100 Mg Xr24h-tab (Metoprolol succinate) .... Take 1 1/2 tablet by mouth once a day 6)  Meloxicam 15 Mg Tabs (Meloxicam) .Marland Kitchen.. 1 by mouth once daily for foot pain 7)  Amlodipine Besylate 10 Mg Tabs (Amlodipine besylate) .Marland Kitchen.. 1 by mouth once daily 8)  Cozaar 50 Mg Tabs (Losartan potassium) .Marland Kitchen.. 1 by mouth once daily 9)  Megestrol Acetate 400 Mg/67ml Susp (Megestrol acetate) .Marland Kitchen.. 1 tsp daily for appetite stimulation 10)  Azithromycin 250 Mg Tabs (Azithromycin) .... 2 tabs day #1, then 1 tab days #2-5  Patient Instructions: 1)  cold - take azithromycin as directed ( antibiotic), take mucinex 2 tabs twice a day, take sudafed 30mg  twice a day for sinus pressure. 2)  weight loss - only down 1 lb since last July o'10. Take carnation instant breakfast and mild once or twice a day. Take megestrol 1 tsp every day for appetite.  Prescriptions: AZITHROMYCIN 250 MG TABS (AZITHROMYCIN) 2 tabs day #1, then 1 tab days #2-5  #6 x 0   Entered and Authorized by:   Jacques Navy MD   Signed by:   Jacques Navy MD on 10/21/2010   Method used:   Faxed to ...       [facility] (retail)       [address]       [place], [place]  [address]       Ph: [phone]       Fax: [phone]   RxID:   [id number] MEGESTROL ACETATE 400 MG/10ML SUSP (MEGESTROL ACETATE) 1 tsp daily for appetite stimulation  #450 cc x 5   Entered and Authorized by:   Jacques Navy MD   Signed by:   Jacques Navy MD on 10/21/2010   Method used:   Faxed to ...       [facility] (retail)       [address]       [place], [place]  [address]       Ph: [phone]       Fax: [phone]   RxID:   [id number]    Orders Added: 1)  Medicare -1st Annual Wellness Visit [G0438] 2)  Est. Patient Level III [phone]

## 2010-12-26 NOTE — Letter (Signed)
Summary: [facility]  [facility]   Imported By: Lennie Odor 10/31/2010 11:19:04  _____________________________________________________________________  External Attachment:    Type:   Image     Comment:   External Document

## 2010-12-30 ENCOUNTER — Ambulatory Visit (INDEPENDENT_AMBULATORY_CARE_PROVIDER_SITE_OTHER): Payer: Medicare Other | Admitting: Internal Medicine

## 2010-12-30 ENCOUNTER — Encounter: Payer: Self-pay | Admitting: Internal Medicine

## 2010-12-30 DIAGNOSIS — I10 Essential (primary) hypertension: Secondary | ICD-10-CM

## 2010-12-30 DIAGNOSIS — R259 Unspecified abnormal involuntary movements: Secondary | ICD-10-CM | POA: Insufficient documentation

## 2010-12-30 DIAGNOSIS — M1990 Unspecified osteoarthritis, unspecified site: Secondary | ICD-10-CM

## 2011-01-08 NOTE — Assessment & Plan Note (Signed)
Summary: R HAND SHAKING  X 1 MTH   STC   Vital Signs:  Patient profile:   77 year old male Height:      69 inches Weight:      148 pounds BMI:     21.93 O2 Sat:      99 % on Room air Temp:     97.4 degrees F oral Pulse rate:   100 / minute BP sitting:   146 / 82  (left arm) Cuff size:   large  Vitals Entered By: Bill Salinas CMA (December 30, 2010 8:56 AM)  O2 Flow:  Room air CC: pt here for evaluation of right hand shaking x 1 month and he also c/o gout attack in right great toe/ ab   Primary Care Provider:  Jacques Navy MD  CC:  pt here for evaluation of right hand shaking x 1 month and he also c/o gout attack in right great toe/ ab.  History of Present Illness: Mr. Wesley [name] is an 77 year old AA male who is very mentally sharp. He presents for a flare of pain in the right 1st MTP joint which he thinks is a flare of gout. He does take allopurinol on a daily basis.   He reports the on-set about 1 month ago of a tremor in the right hand at rest and with intention. He has had no change in his gait.   He needs a handicap placard.  He is current with Dr. Alanda Amass for cardiac care and BP management.   Current Medications (verified): 1)  Allopurinol 300 Mg  Tabs (Allopurinol) .... Take One Tablet Once Daily 2)  Aspirin 81 Mg  Tabs (Aspirin) .... Take One Tablet Daily 3)  Klor-Con 20 Meq  Pack (Potassium Chloride) .... Take One Tablet Once Daily 4)  Furosemide 40 Mg  Tabs (Furosemide) .... Take One Tablet Daily 5)  Metoprolol Succinate 100 Mg  Xr24h-Tab (Metoprolol Succinate) .... Take 1 1/2 Tablet By Mouth Once A Day 6)  Meloxicam 15 Mg  Tabs (Meloxicam) .Marland Kitchen.. 1 By Mouth Once Daily For Foot Pain 7)  Amlodipine Besylate 10 Mg Tabs (Amlodipine Besylate) .Marland Kitchen.. 1 By Mouth Once Daily 8)  Cozaar 50 Mg Tabs (Losartan Potassium) .Marland Kitchen.. 1 By Mouth Once Daily 9)  Megestrol Acetate 400 Mg/40ml Susp (Megestrol Acetate) .Marland Kitchen.. 1 Tsp Daily For Appetite Stimulation  Allergies (verified): No Known  Drug Allergies  Past History:  Past Medical History: Last updated: 10/21/2010 Current Problems:  OSTEOARTHROS UNSPEC GEN/LOC OTH SPEC SITES (ICD-715.98) ACUTE RENAL FAIL W/OTH SPEC PATHAL LESION KIDNEY (ICD-584.8) PROSTATITIS, HX OF (ICD-V13.09) PERCUTANEOUS TRANSLUMINAL CORONARY ANGIOPLASTY, HX OF (ICD-V45.82) CATARACT EXTRACTION, HX OF (ICD-V45.61) Hx of PERIPHERAL VASCULAR DISEASE (ICD-443.9) NEOPLASM, MALIGNANT, LARYNX, HX OF (ICD-V10.21) PSA, INCREASED (ICD-790.93) GOUT (ICD-274.9) PEPTIC ULCER, ACUTE, HEMORRHAGE, HX OF (ICD-V12.71) HYPERTENSION (ICD-401.9)      Past Surgical History: Last updated: 10/21/2010 PERCUTANEOUS TRANSLUMINAL CORONARY ANGIOPLASTY, HX OF (ICD-V45.82) CATARACT EXTRACTION, HX OF (ICD-V45.61) TRUNCAL VAGOTOMY/GASTROJEJUNOSOTOMY/CHOLECYSTECTOMY Aug '06 ([facility])  Family History: Last updated: 07/31/08 Father: died at 65 from heart problems Mother: died at 10 from [place] (no medical problems) 5 Siblings: Brother (died of cancer at 61), others unknown  Social History: Last updated: 10/21/2010 8th Grade education Divorced in 2004, Married in 1969, Married in 1949 Retired, 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 End-of-life: wants CPR but no prolonged heroic measures.   Risk Factors: Alcohol Use: 4 (10/21/2010) Caffeine Use: 1-2 cps  per day (10/21/2010) Diet: poor diet - low in calories (10/21/2010) Exercise: no (10/21/2010)  Risk Factors: Smoking Status: quit (10/21/2010) Packs/Day: 0.5 (07/05/2008)  Review of Systems  The patient denies anorexia, fever, weight loss, weight gain, vision loss, decreased hearing, chest pain, dyspnea on exertion, peripheral edema, abdominal pain, severe indigestion/heartburn, muscle weakness, suspicious skin lesions, depression, and enlarged lymph nodes.   MS:  Complains of joint pain, joint redness, and joint swelling; denies loss of strength, muscle  aches, and cramps.  Physical Exam  General:  Tall, thin Aa male looking younger than his 72 years. Head:  Mild temporal wasting. Eyes:  C&S muddy, arcus senilis bilaterally Neck:  supple.   Lungs:  normal respiratory effort, normal breath sounds, and no wheezes.   Heart:  normal rate and regular rhythm.   Msk:  right 1st MTP without heat, redness,swelling. Tender to palpation and to movement. Pulses:  2+ radial  Neurologic:  alert & oriented X3 and cranial nerves II-XII intact.  Low frequency tremor noted right hand. No cogwheeling right or left. Slightly fenestrating gait, normal stop and turn.  Skin:  turgor normal and color normal.   Psych:  Oriented X3, memory intact for recent and remote, normally interactive, and good eye contact.     Impression & Recommendations:  Problem # 1:  OSTEOARTHROS UNSPEC GEN/LOC OTH SPEC SITES (ICD-715.98) Foot/toe pain does not appear to be a gout flare but rather arthritic pain.  Plan - continue meloxicam           may supplement with APAP up to 100mg  three times a day.   His updated medication list for this problem includes:    Aspirin 81 Mg Tabs (Aspirin) .Marland Kitchen... Take one tablet daily    Meloxicam 15 Mg Tabs (Meloxicam) .Marland Kitchen... 1 by mouth once daily for foot pain  Problem # 2:  HYPERTENSION (ICD-401.9)  His updated medication list for this problem includes:    Furosemide 40 Mg Tabs (Furosemide) .Marland Kitchen... Take one tablet daily    Metoprolol Succinate 100 Mg Xr24h-tab (Metoprolol succinate) .Marland Kitchen... Take 1 1/2 tablet by mouth once a day    Amlodipine Besylate 10 Mg Tabs (Amlodipine besylate) .Marland Kitchen... 1 by mouth once daily    Cozaar 50 Mg Tabs (Losartan potassium) .Marland Kitchen... 1 by mouth once daily  BP today: 146/82 Prior BP: 110/56 (10/21/2010)  Adequate control on present medications.   Problem # 3:  IDIOPATHIC TREMOR (ICD-781.0) New tremor but without definitive signs of parkinson's disease.  Plan - watchful waiting.   Complete Medication List: 1)   Allopurinol 300 Mg Tabs (Allopurinol) .... Take one tablet once daily 2)  Aspirin 81 Mg Tabs (Aspirin) .... Take one tablet daily 3)  Klor-con 20 Meq Pack (Potassium chloride) .... Take one tablet once daily 4)  Furosemide 40 Mg Tabs (Furosemide) .... Take one tablet daily 5)  Metoprolol Succinate 100 Mg Xr24h-tab (Metoprolol succinate) .... Take 1 1/2 tablet by mouth once a day 6)  Meloxicam 15 Mg Tabs (Meloxicam) .Marland Kitchen.. 1 by mouth once daily for foot pain 7)  Amlodipine Besylate 10 Mg Tabs (Amlodipine besylate) .Marland Kitchen.. 1 by mouth once daily 8)  Cozaar 50 Mg Tabs (Losartan potassium) .Marland Kitchen.. 1 by mouth once daily 9)  Megestrol Acetate 400 Mg/77ml Susp (Megestrol acetate) .Marland Kitchen.. 1 tsp daily for appetite stimulation  Patient Instructions: 1)  Foot pain - this does not look like gout today and is probably arthritis. Plan - take the meloxicam once a day. For additional pain you can  take tylenol 500mg  one or two every 8 hours.  2)  Tremor - does not look like parkinson's. Let me know if this gets worse.  Prescriptions: MELOXICAM 15 MG  TABS (MELOXICAM) 1 by mouth once daily for foot pain  #30 x 12   Entered and Authorized by:   Jacques Navy MD   Signed by:   Jacques Navy MD on 12/30/2010   Method used:   Electronically to        [facility] (retail)       [address]       [place], [place]  [address]       Ph: [phone]       Fax: [phone]   RxID:   [id number]    Orders Added: 1)  Est. Patient Level III [84132]

## 2011-02-14 ENCOUNTER — Inpatient Hospital Stay (HOSPITAL_COMMUNITY)
Admission: EM | Admit: 2011-02-14 | Discharge: 2011-02-17 | DRG: 872 | Disposition: A | Discharge status: Home / Self Care | Payer: Medicare Other | Attending: Internal Medicine | Admitting: Internal Medicine

## 2011-02-14 ENCOUNTER — Emergency Department (HOSPITAL_COMMUNITY): Payer: Medicare Other

## 2011-02-14 DIAGNOSIS — I10 Essential (primary) hypertension: Secondary | ICD-10-CM | POA: Diagnosis present

## 2011-02-14 DIAGNOSIS — N390 Urinary tract infection, site not specified: Secondary | ICD-10-CM | POA: Diagnosis present

## 2011-02-14 DIAGNOSIS — F17200 Nicotine dependence, unspecified, uncomplicated: Secondary | ICD-10-CM | POA: Diagnosis present

## 2011-02-14 DIAGNOSIS — A419 Sepsis, unspecified organism: Principal | ICD-10-CM | POA: Diagnosis present

## 2011-02-14 DIAGNOSIS — J449 Chronic obstructive pulmonary disease, unspecified: Secondary | ICD-10-CM | POA: Diagnosis present

## 2011-02-14 DIAGNOSIS — E876 Hypokalemia: Secondary | ICD-10-CM | POA: Diagnosis present

## 2011-02-14 DIAGNOSIS — J4489 Other specified chronic obstructive pulmonary disease: Secondary | ICD-10-CM | POA: Diagnosis present

## 2011-02-14 DIAGNOSIS — A498 Other bacterial infections of unspecified site: Secondary | ICD-10-CM | POA: Diagnosis present

## 2011-02-14 DIAGNOSIS — I739 Peripheral vascular disease, unspecified: Secondary | ICD-10-CM | POA: Diagnosis present

## 2011-02-14 DIAGNOSIS — R259 Unspecified abnormal involuntary movements: Secondary | ICD-10-CM | POA: Diagnosis present

## 2011-02-14 DIAGNOSIS — F1010 Alcohol abuse, uncomplicated: Secondary | ICD-10-CM | POA: Diagnosis present

## 2011-02-14 DIAGNOSIS — Z66 Do not resuscitate: Secondary | ICD-10-CM | POA: Diagnosis present

## 2011-02-14 DIAGNOSIS — Z7982 Long term (current) use of aspirin: Secondary | ICD-10-CM

## 2011-02-14 LAB — CBC
HCT: 31.7 % — ABNORMAL LOW (ref 39.0–52.0)
MCV: 96.9 fL (ref 78.0–100.0)
RBC: 3.27 MIL/uL — ABNORMAL LOW (ref 4.22–5.81)
RDW: 13.2 % (ref 11.5–15.5)
WBC: 5.6 10*3/uL (ref 4.0–10.5)

## 2011-02-14 LAB — URINALYSIS, ROUTINE W REFLEX MICROSCOPIC
Bilirubin Urine: NEGATIVE
Specific Gravity, Urine: 1.013 (ref 1.005–1.030)
pH: 5.5 (ref 5.0–8.0)

## 2011-02-14 LAB — URINE MICROSCOPIC-ADD ON

## 2011-02-14 LAB — DIFFERENTIAL
Basophils Relative: 0 % (ref 0–1)
Eosinophils Relative: 0 % (ref 0–5)
Monocytes Absolute: 0.1 10*3/uL (ref 0.1–1.0)
Neutrophils Relative %: 92 % — ABNORMAL HIGH (ref 43–77)

## 2011-02-15 ENCOUNTER — Emergency Department (HOSPITAL_COMMUNITY): Payer: Medicare Other

## 2011-02-15 DIAGNOSIS — N390 Urinary tract infection, site not specified: Secondary | ICD-10-CM

## 2011-02-15 DIAGNOSIS — A419 Sepsis, unspecified organism: Secondary | ICD-10-CM

## 2011-02-15 DIAGNOSIS — E876 Hypokalemia: Secondary | ICD-10-CM

## 2011-02-15 DIAGNOSIS — F1010 Alcohol abuse, uncomplicated: Secondary | ICD-10-CM

## 2011-02-15 LAB — COMPREHENSIVE METABOLIC PANEL
AST: 52 U/L — ABNORMAL HIGH (ref 0–37)
Albumin: 3 g/dL — ABNORMAL LOW (ref 3.5–5.2)
Albumin: 3.1 g/dL — ABNORMAL LOW (ref 3.5–5.2)
BUN: 11 mg/dL (ref 6–23)
CO2: 21 mEq/L (ref 19–32)
Calcium: 8 mg/dL — ABNORMAL LOW (ref 8.4–10.5)
Chloride: 106 mEq/L (ref 96–112)
Creatinine, Ser: 1.35 mg/dL (ref 0.4–1.5)
Creatinine, Ser: 1.16 mg/dL (ref 0.4–1.5)
GFR calc Af Amer: >60 mL/min (ref >60)
GFR calc non Af Amer: 59 mL/min — ABNORMAL LOW (ref >60)
Glucose, Bld: 104 mg/dL — ABNORMAL HIGH (ref 70–99)
Total Bilirubin: 1.1 mg/dL (ref 0.3–1.2)

## 2011-02-15 LAB — CARDIAC PANEL(CRET KIN+CKTOT+MB+TROPI)
CK, MB: 2.4 ng/mL (ref 0.3–4.0)
Relative Index: 1.8 (ref 0.0–2.5)
Total CK: 133 U/L (ref 7–232)

## 2011-02-15 LAB — DIFFERENTIAL
Basophils Relative: 0 % (ref 0–1)
Eosinophils Absolute: 0 10*3/uL (ref 0.0–0.7)
Monocytes Relative: 5 % (ref 3–12)
Neutrophils Relative %: 86 % — ABNORMAL HIGH (ref 43–77)

## 2011-02-15 LAB — CK TOTAL AND CKMB (NOT AT ARMC)
CK, MB: 2.2 ng/mL (ref 0.3–4.0)
Relative Index: 1.8 (ref 0.0–2.5)

## 2011-02-15 LAB — LACTIC ACID, PLASMA
Lactic Acid, Venous: 9.4 mmol/L — ABNORMAL HIGH (ref 0.5–2.2)
Lactic Acid, Venous: 4.2 mmol/L — ABNORMAL HIGH (ref 0.5–2.2)

## 2011-02-15 LAB — CBC
MCH: 34.6 pg — ABNORMAL HIGH (ref 26.0–34.0)
Platelets: 154 10*3/uL (ref 150–400)
RBC: 3.47 MIL/uL — ABNORMAL LOW (ref 4.22–5.81)
RDW: 13.1 % (ref 11.5–15.5)
WBC: 8.8 10*3/uL (ref 4.0–10.5)

## 2011-02-15 LAB — MRSA PCR SCREENING: MRSA by PCR: NEGATIVE

## 2011-02-15 LAB — PHOSPHORUS: Phosphorus: 2.2 mg/dL — ABNORMAL LOW (ref 2.3–4.6)

## 2011-02-15 LAB — PROTIME-INR: Prothrombin Time: 14.7 seconds (ref 11.6–15.2)

## 2011-02-15 LAB — MAGNESIUM: Magnesium: 1.6 mg/dL (ref 1.5–2.5)

## 2011-02-15 LAB — TSH: TSH: 0.92 u[IU]/mL (ref 0.350–4.500)

## 2011-02-16 LAB — DIFFERENTIAL
Lymphocytes Relative: 17 % (ref 12–46)
Lymphs Abs: 1.1 10*3/uL (ref 0.7–4.0)
Monocytes Relative: 8 % (ref 3–12)
Neutro Abs: 5 10*3/uL (ref 1.7–7.7)
Neutrophils Relative %: 75 % (ref 43–77)

## 2011-02-16 LAB — BASIC METABOLIC PANEL
BUN: 10 mg/dL (ref 6–23)
CO2: 24 mEq/L (ref 19–32)
Chloride: 104 mEq/L (ref 96–112)
Creatinine, Ser: 1.28 mg/dL (ref 0.4–1.5)
Potassium: 3.4 mEq/L — ABNORMAL LOW (ref 3.5–5.1)

## 2011-02-16 LAB — CBC
HCT: 30.4 % — ABNORMAL LOW (ref 39.0–52.0)
Hemoglobin: 11 g/dL — ABNORMAL LOW (ref 13.0–17.0)
MCH: 34.8 pg — ABNORMAL HIGH (ref 26.0–34.0)
MCV: 96.2 fL (ref 78.0–100.0)
RBC: 3.16 MIL/uL — ABNORMAL LOW (ref 4.22–5.81)
WBC: 6.7 10*3/uL (ref 4.0–10.5)

## 2011-02-17 LAB — URINE CULTURE: Colony Count: >=100000

## 2011-02-19 NOTE — H&P (Signed)
Wesley [name], CLEARY                 ACCOUNT NO.:  [id number]  MEDICAL RECORD NO.:  [id number]           PATIENT TYPE:  E  LOCATION:  [facility]                         FACILITY:  [facility]  PHYSICIAN:  Wesley [name], MDDATE OF BIRTH:  1920-07-04  DATE OF ADMISSION:  02/15/2011 DATE OF DISCHARGE:                             HISTORY & PHYSICAL   CHIEF COMPLAINT:  Fever.  PRIMARY CARE PROVIDER:  Rosalyn Gess. Norins, MD  The patient is an 77 year old gentleman.  He lives at home.  He has a history of alcohol and tobacco abuse.  For the past few days, he has been feeling overall unwell and running a fever up to 101.  However, he finally presented to the emergency department.  In the ED, he was found to be febrile up to 101.7, tachycardic in 130s and UA showed urinary tract infection, at which point [facility] was called for admission.  Of note, his lactic acid was noted to be severely elevated at 9.4.  He has no other complaints about urine besides fever.  He has no chest pains.  No abdominal pain.  He did have one episode of diarrhea 2 days ago, but it was very isolated and had not had any nausea or vomiting.  He does drink on a regular basis in large amount, but is not aware of any renal disease.  He has a history of peripheral vascular disease, but his lower extremities are warm.  He did become slightly hypertensive in the mid of me having my examination.  Systolic was down to mid 90s, but he continued to maintain good mental status, sitting in bed, conversant.  REVIEW OF SYSTEMS:  Otherwise negative except for HPI.  No chest pain. No shortness of breath.  PAST MEDICAL HISTORY:  Significant for: 1. COPD. 2. Anemia. 3. Alcohol abuse. 4. Tobacco abuse. 5. Gout. 6. Hypertension. 7. Laryngeal cancer, status post radiation therapy. 8. History of peptic ulcer disease. 9. Peripheral vascular disease, status post stenting. 10.History of renal insufficiency.  SOCIAL HISTORY:   The patient smokes a pack a day.  Drinks about a fifth a day of hard liquor and been doing all his life.  Denied drugs.  Lives alone at home.  FAMILY HISTORY:  Significant for father with coronary artery disease in his 68s.  ALLERGIES:  None.  MEDICATIONS:  The patient cannot recollect the dosages of his meds, but thinks that he takes: 1. Losartan. 2. Aspirin 81 mg daily. 3. Lopressor. 4. Meloxicam. 5. Norvasc.  PHYSICAL EXAMINATION:  VITAL SIGNS:  Temperature T-max 101.3.  Blood pressure 122/54 initially, now down to 97/60s.  Pulse initially 130s, now 105.  Respirations 16.  Saturating 100% on room air. GENERAL:  The patient appears to be in no acute distress, speaking in full sentences. HEAD:  Nontraumatic.  Somewhat dry mucous membranes.  Good dentition. LUNGS:  Clear to auscultation, although somewhat distant bilaterally. No wheezes or crackles appreciated. HEART:  Regular rate and rhythm, but rapid. ABDOMEN:  Soft, nontender, and nondistended.  There is a ventral scar present which is healed. LOWER EXTREMITIES:  Without clubbing, cyanosis, or edema,  bilaterally warm. NEUROLOGICAL:  Grossly intact.  LABORATORY DATA:  White blood cell count 5.6, hemoglobin 11.4, sodium 130, potassium 3.0, and creatinine 1.35.  AST 52, total protein 7.0, and albumin 3.0.  UA showing too numerous to count white blood cells and positive nitrites.  Lactic acid at 9.4.  Chest x-ray is unremarkable.  ASSESSMENT/PLAN:  This is an 77 year old gentleman with past history of alcohol and tobacco abuse who presents with urinary tract infection and early sepsis with elevated lactate. 1. Early sepsis.  We will admit.  We will give IV hydration and give     Rocephin since this is source of infection.  He is maintaining good     mental status.  We will continue to monitor urine output.  We will     put on Step-Down.  He actually looks relatively well.  His lactic     acid is elevated, but I also wonder  if he has some liver     dysfunction given a longstanding history of alcohol abuse which     could interfere with lactate clearance.  We will follow lactate.     We will obtain also liver ultrasound. 2. Alcohol abuse.  We will put on CIWA protocol and evaluate liver. 3. Low potassium.  We will replace. 4. Urinary tract infection.  Treat with Rocephin. 5. History of anemia, currently appears to be stable. 6. Hypertension.  Currently hypotensive.  Hold losartan, Lopressor,     and Norvasc. 7. History of peripheral vascular disease.  Currently, no evidence of     ischemia per physical exam. 8. Prophylaxis.  Protonix and SCDs for now until have his PT, PTT, and     INR given possibility of liver disease.  CODE STATUS:  The patient is do not resuscitate, do not intubate.     Wesley Cowboy, MD     AVD/MEDQ  D:  02/15/2011  T:  02/15/2011  Job:  [id number]  cc:   Rosalyn Gess. Norins, MD  Electronically Signed by Therisa Doyne MD on 02/19/2011 02:40:10 AM

## 2011-02-21 LAB — CULTURE, BLOOD (ROUTINE X 2)
Culture  Setup Time: 201203251155
Culture  Setup Time: 201203251155
Culture: NO GROWTH

## 2011-02-23 NOTE — Discharge Summary (Signed)
Wesley [name], Wesley [name]                 ACCOUNT NO.:  [id number]  MEDICAL RECORD NO.:  [id number]           PATIENT TYPE:  I  LOCATION:  5005                         FACILITY:  [facility]  PHYSICIAN:  Rosalyn Gess. Hodari Chuba, MD  DATE OF BIRTH:  1920-03-28  DATE OF ADMISSION:  02/14/2011 DATE OF DISCHARGE:  02/17/2011                              DISCHARGE SUMMARY   ADMITTING DIAGNOSES: 1. Urinary tract infection with question of sepsis. 2. History of alcohol abuse. 3. Hypokalemia. 4. Chronic anemia. 5. Hypertension. 6. Peripheral vascular disease.  DISCHARGE DIAGNOSES: 1. Urinary tract infection with question of sepsis. 2. History of alcohol abuse. 3. Hypokalemia. 4. Chronic anemia. 5. Hypertension. 6. Peripheral vascular disease.  CONSULTANTS:  None.  PROCEDURES: 1. Chest x-ray, two-view at admission, which showed COPD with chronic     changes with no active cardiopulmonary disease. 2. Abdominal ultrasound, which showed limited visualization of the     IVC, pancreas, and aorta.  The absence of gallbladder was noted,     status post cholecystectomy.  HISTORY OF PRESENT ILLNESS:  Wesley [name] is an 77 year old African American gentleman, who lives independently and has been in generally stable condition with a recent office visit.  The patient does have a history of ongoing use of alcohol and tobacco.  For the few days prior to admission, he was feeling unwell and started running temperatures to 101.  He presented to the emergency department because of his persistent symptoms.  He was found to have a temperature of 101.7.  He was tachycardic in the 130s.  Laboratory work revealed a urinalysis that was positive.  Lactic acid was markedly elevated at 9.4. The patient's symptoms were limited.  He was admitted for a UTI with possible sepsis with elevated lactic acid level, tachycardia, and high fever.  Please see H and P for past medical history, family history, social history, and  physical exam as well as old records.  HOSPITAL COURSE: 1. ID.  The patient was started on Rocephin on admission.  He never     mustered much of leukocytosis, but did have a left shift.  UA was     positive with the wbc's too numerous to count and many bacteria.     The patient responded well to therapy.  His temperature stayed     normal.  Lactic acid level came down to 3.3.  Cardiac enzymes were     negative.  The patient was switched to oral antibiotics on February 16, 2011 with Septra DS b.i.d.  Urine cultures came back for E.     coli.  Sensitivities were pending at the time of dictation.  With     the patient being afebrile with his lactic acid level returning to     normal with final laboratory from the evening of February 16, 2011     returning with a CBC was normal with a white count 6700, hemoglobin     of 11 g, platelet count of 134,000 with chemistries that were     essentially normal except for slightly depressed potassium at 3.4.  The patient was thought to be stable and able to be discharged to     home to complete oral antibiotics. 2. Tremor.  The patient does have a significant resting tremor of the     right hand, mild tremor of the left hand.  This does get better     with intention.  He had no cogwheeling.  The patient is concerned     about Parkinson disease.  This will be followed up as an     outpatient. 3. Alcohol abuse.  The patient had no signs of withdrawal during his     hospitalization.  Ultrasound with results as noted.  The patient     continues to enjoy a glass of Chevys.  At this point, he is     incorrigible and will not make an attempt to encourage abstinence.  DISCHARGE EXAMINATION:  VITAL SIGNS:  Temperature was 98.1, blood pressure 102/70, heart rate was 87, respirations 18, O2 sats 98% on room air. GENERAL APPEARANCE:  This is a slender, elderly African American gentleman who looks younger than the stated chronologic age. HEENT:  The patient  has arcus senilis bilaterally.  Conjunctivae and sclerae were otherwise clear.  Head was unremarkable. NECK:  Supple. CHEST:  The patient is moving air well with no rales, wheezes, or rhonchi.  CARDIOVASCULAR:  2+ radial pulses.  Precordium was quiet.  His heart rate was regular.  ABDOMEN:  Soft.  No guarding or rebound or tenderness was noted.  No further examination conducted.  FINAL LABORATORY:  White count was 6700 with 75% segs, 17% lymphs, 8% monos, hemoglobin 11 g, platelet count 134,000.  Sodium 133, potassium 3.4, chloride 104, CO2 of 24, BUN of 10, creatinine 1.28, glucose was 102.  DISPOSITION:  The patient is discharged to home at his own recognizance. He will continue on all of his home medications.  We will add Septra DS b.i.d. for additional 5 days.  FOLLOWUP:  The patient will be seen in the office within 7 days and office will contact him with an appointment time.  The patient's condition at the time of discharge dictation is stable and improved.     Rosalyn Gess Media Pizzini, MD     MEN/MEDQ  D:  02/17/2011  T:  02/18/2011  Job:  [id number]  Electronically Signed by Illene Regulus MD on 02/23/2011 09:03:17 AM

## 2011-03-12 ENCOUNTER — Encounter: Payer: Self-pay | Admitting: Internal Medicine

## 2011-03-12 ENCOUNTER — Ambulatory Visit (INDEPENDENT_AMBULATORY_CARE_PROVIDER_SITE_OTHER): Payer: Medicare Other | Admitting: Internal Medicine

## 2011-03-12 DIAGNOSIS — R251 Tremor, unspecified: Secondary | ICD-10-CM

## 2011-03-12 DIAGNOSIS — Z8711 Personal history of peptic ulcer disease: Secondary | ICD-10-CM

## 2011-03-12 DIAGNOSIS — I10 Essential (primary) hypertension: Secondary | ICD-10-CM

## 2011-03-12 DIAGNOSIS — R259 Unspecified abnormal involuntary movements: Secondary | ICD-10-CM

## 2011-03-12 DIAGNOSIS — Z8521 Personal history of malignant neoplasm of larynx: Secondary | ICD-10-CM

## 2011-03-12 NOTE — Progress Notes (Signed)
  Subjective:    Patient ID: Wesley [name], male    DOB: 1920/05/17, 77 y.o.   MRN: [id number]  HPIMr. Hassing was discharge from the hospital March 29th after an admission for urosepsis. He presents today complaining of feeling cold and shaky all the time. He denies fevers, sweats or hard rigors. He has no focal complaint: no cough, SOB, abdominal pain.  He reports that with exertion he will have a pressure/tightness in his chest. This will pass quickly with rest. Reviewed old notes: he was seen and evaluated by Dr. Alanda Amass Spring of '11 including 2D echo that revealed normal EF; myoview stress that was negative for ischemia. [name] reports that he did have LE arterial dopplers recently.  He c/o weakness and pain in the right leg. He is limited in how far he can walk due to pain/weakness.     Review of Systems  Constitutional: Positive for fatigue. Negative for fever, chills and activity change.  HENT: Negative.   Eyes: Negative.   Respiratory: Positive for cough, chest tightness and shortness of breath. Negative for wheezing.   Cardiovascular: Positive for chest pain. Negative for palpitations.  Gastrointestinal: Negative for abdominal pain, constipation, blood in stool and abdominal distention.  Genitourinary: Negative.   Musculoskeletal: Positive for myalgias, back pain, arthralgias and gait problem. Negative for joint swelling.  Skin: Negative for color change, pallor and rash.  Neurological: Negative for dizziness, speech difficulty, weakness, numbness and headaches.  Hematological: Negative for adenopathy.  Psychiatric/Behavioral: Negative.        Objective:   Physical Exam  [vitalsreviewed. Constitutional: He is oriented to person, place, and time. No distress.       Tall, thin, elderly AA male in no acute distress  HENT:  Head: Normocephalic and atraumatic.       Mild temporal wasting  Eyes: No scleral icterus.       Bulbar conjunctiva a little muddy. NO injection. Pupils  are equal  Neck: Neck supple.  Cardiovascular: Normal rate, regular rhythm and normal heart sounds.  Exam reveals no friction rub.        0 to trace DP pulse right foot. 0-trace PT pulse right foot. Skin is dark and cool to touch right distal leg and foot. Left DP /PT pulses 1+, skin color normal.  Pulmonary/Chest: Effort normal and breath sounds normal. No respiratory distress. He has no wheezes. He has no rales.  Musculoskeletal: Normal range of motion. He exhibits no edema.  Neurological: He is alert and oriented to person, place, and time. He has normal reflexes.  Skin: Skin is warm and dry.       Distal right LE is very cool  Psychiatric: He has a normal mood and affect. His behavior is normal. Thought content normal.          Assessment & Plan:  1. Cold feeling - patient with a h/o anemia. Also chronic alcohol use raising the possibility of neuropathy. No thyromegaly or respiratory findings. Concern for anemia vs thyroid abnormality vs other.  Plan - CBC, TSH, FT4 with recommendations to follow.  2. PVD - patient reports his dopplers were normal. On exam he has significant decreased blood flow to right foot and distal leg.   Plan - defer to Dr Ethlyn Gallery  3. CAD- patient with high risk profile and a description of SSCP/pressure with exertion. He has had a negative nuclear stress test in May '11  Plan - defer to Dr. Alanda Amass.

## 2011-03-13 ENCOUNTER — Other Ambulatory Visit (INDEPENDENT_AMBULATORY_CARE_PROVIDER_SITE_OTHER): Payer: Medicare Other

## 2011-03-13 DIAGNOSIS — R251 Tremor, unspecified: Secondary | ICD-10-CM

## 2011-03-13 DIAGNOSIS — I10 Essential (primary) hypertension: Secondary | ICD-10-CM

## 2011-03-13 DIAGNOSIS — Z8711 Personal history of peptic ulcer disease: Secondary | ICD-10-CM

## 2011-03-13 DIAGNOSIS — R259 Unspecified abnormal involuntary movements: Secondary | ICD-10-CM

## 2011-03-13 LAB — TSH: TSH: 1.89 u[IU]/mL (ref 0.35–5.50)

## 2011-03-13 LAB — CBC WITH DIFFERENTIAL/PLATELET
Basophils Relative: 0.4 % (ref 0.0–3.0)
Eosinophils Absolute: 0 10*3/uL (ref 0.0–0.7)
Eosinophils Relative: 0.7 % (ref 0.0–5.0)
HCT: 31 % — ABNORMAL LOW (ref 39.0–52.0)
Lymphs Abs: 1.5 10*3/uL (ref 0.7–4.0)
MCHC: 34.8 g/dL (ref 30.0–36.0)
MCV: 103.6 fl — ABNORMAL HIGH (ref 78.0–100.0)
Monocytes Absolute: 0.4 10*3/uL (ref 0.1–1.0)
Neutrophils Relative %: 55.3 % (ref 43.0–77.0)
Platelets: 248 10*3/uL (ref 150.0–400.0)
RBC: 2.99 Mil/uL — ABNORMAL LOW (ref 4.22–5.81)
WBC: 4.3 10*3/uL — ABNORMAL LOW (ref 4.5–10.5)

## 2011-03-13 LAB — COMPREHENSIVE METABOLIC PANEL
AST: 23 U/L (ref 0–37)
Alkaline Phosphatase: 66 U/L (ref 39–117)
BUN: 19 mg/dL (ref 6–23)
Glucose, Bld: 123 mg/dL — ABNORMAL HIGH (ref 70–99)
Potassium: 4.4 mEq/L (ref 3.5–5.1)
Total Bilirubin: 0.4 mg/dL (ref 0.3–1.2)

## 2011-03-15 ENCOUNTER — Encounter: Payer: Self-pay | Admitting: Internal Medicine

## 2011-03-30 ENCOUNTER — Other Ambulatory Visit: Payer: Self-pay | Admitting: *Deleted

## 2011-03-30 MED ORDER — ALLOPURINOL 300 MG PO TABS: 300.0000 mg | ORAL_TABLET | Freq: Every day | ORAL | Status: DC

## 2011-04-07 NOTE — H&P (Signed)
Wesley [name], Wesley [name] NO.:  [id number]   MEDICAL RECORD NO.:  [id number]          PATIENT TYPE:  OBV   LOCATION:  0105                         FACILITY:  [facility]   PHYSICIAN:  Hollice Espy, M.D.DATE OF BIRTH:  May 03, 1920   DATE OF ADMISSION:  10/20/2007  DATE OF DISCHARGE:                              HISTORY & PHYSICAL   PRIMARY CARE PHYSICIAN:  Dr. Illene Regulus.   CHIEF COMPLAINT:  Diarrhea.   HISTORY OF PRESENT ILLNESS:  Patient is an 77 year old African American  male with past medical history of peripheral vascular disease, alcohol  abuse and hypertension who tells me that for the last several days he  has problems with diarrhea where he is able to eat but then it  immediately goes right through him.  His symptoms persisted.  He has  felt quite weak.  He initially was complaining of what the emergency  room doctor thought was chest pain but on my further interview, he said  it is not chest pain so much as he feels as if his chest gets tight with  exertion, and it makes it hard to breath but is not exactly pain.  I  asked him if there is any type of chest pressure, and he denied this as  well.  When he came into the emergency room, he had vitals checked.  Initially, his heart rate was noted to be 78, and then there is  documentation that his heart rate jumped up into the 130s to 150s.  However, on review of his EKG as well as while on telemetry, actually he  is in a sinus arrhythmia with peaked waves that are leading to counting  of his heart rate of double what it should be, so therefore he is not in  tachycardia.  He had labs drawn, and he was found to have a BUN of 37, a  creatinine of 2.23.  His white count, H&H and MCV all were essentially  normal.  Cardiac markers were checked.  He was found to have an elevated  CPK level but a normal MB and troponin.  Chest x-ray showed no evidence  of any acute disease.  Currently, the patient states he is  feeling well.  He feels tired.  Denies any headaches, vision changes, dysphagia, chest  pain, palpitations, shortness of breath.  He says as long as he is  resting, he has no dyspnea on exertion.  He has noted some cough but  this is chronic and usually with yellowish or white sputum.  No  wheezing.  No abdominal pain.  No hematuria or dysuria.  No  constipation.  No focal numbness, weakness or pain. other than chronic  lower extremity numbness from his peripheral vascular disease.  Review  of systems otherwise negative.   PAST MEDICAL HISTORY:  1. Peripheral vascular disease.  2. Hypertension.  3. Alcohol abuse.  4. Tobacco abuse.  5. History of peptic ulcer disease.  6. Laryngeal cancer.  7. History of anemia.   MEDICATIONS:  Patient is on:  1. Toprol 200.  2. K-Dur 20.  3. Allopurinol 300.  4. Aspirin 81.  5. Diovan.   He had no known drug allergies.   SOCIAL HISTORY:  Smokes about 2 or 3 cigars a week.  He used to  apparently smoke heavy cigarettes but not for the last few months.  He  does however drink heavily about he says a 6-pack of beer on weekends  and also about half a pint daily.   FAMILY HISTORY:  Known for CAD.   PHYSICAL EXAMINATION:  Patient's vitals on admission:  Temperature 97,  heart rate 78, blood pressure 114/61, respirations 22, O2 sat 98% on  room air.  GENERAL:  He is alert and oriented x3.  In no apparent distress.  HEENT:  Normocephalic, atraumatic.  His mucous membranes are slightly  dry.  He has no carotid bruits.  HEART:  He has irregular rhythm but looks to be sinus.  Frequent PVCs.  LUNGS:  He had decreased breath sounds throughout.  ABDOMEN:  Soft, nontender, nondistended.  Hyperactive bowel sounds.  EXTREMITIES:  Show no clubbing, cyanosis or edema.   LAB WORK:  Chest x-rays as per HPI.  EKG shows normal sinus rhythm,  sinus arrhythmia, left ventricular hypertrophy with QRS widening and  repolarization abnormalities.  No previous EKGs  to compare to.  Sodium  132, potassium 5.1, chloride 104, bicarbonate 19, BUN 37, creatinine  2.2, glucose 90, white count 5.4, H&H 13.7 and 39, MCV 100, platelet  count 191, no shift, CPK 299, MB 5.4, troponin I less than 0.05.   ASSESSMENT AND PLAN:  1. Diarrhea.  Looks to be more of a gastroenteritis.  Will just treat      with p.r.n. Imodium and IV fluids.  2. Acute renal failure secondary to #1.  Gently hydrate.  3. Peripheral vascular disease.  Stable.  Holding aspirin until his      renal function improves.  4. Dyspnea on exertion.  Looks to be possibly just a mild flareup of      his chronic lung disease from smoking.  5. History of alcohol abuse.  Patient says his last drink was today.      I put him on p.r.n. Ativan.  He denies any previous episodes of      alcohol withdrawal.      Hollice Espy, M.D.  Electronically Signed     SKK/MEDQ  D:  10/20/2007  T:  10/20/2007  Job:  [id number]   cc:   Rosalyn Gess. Norins, MD  [address]  [place]  [place] [address]

## 2011-04-07 NOTE — Assessment & Plan Note (Signed)
[facility]                             PULMONARY OFFICE NOTE   Wesley [name], Wesley [name]                          MRN:          [id number]  DATE:04/06/2007                            DOB:          July 23, 1920    REFERRING PHYSICIAN:  Rosalyn Gess. Norins, MD   The pulmonary consultation is requested by Dr. Debby Bud.   REASON FOR CONSULTATION:  Is possible pneumonia.   HISTORY:  This is an 77 year old black male, states he became abruptly  ill 3 weeks ago with a cough productive of yellow sputum, hard chills  and dyspnea.  He was admitted to [facility] 05/06 to 05/08 with a  diagnosis of a left lower lobe pneumonia with question of a pleural  effusion that did not appear to be present either by CT scan or  ultrasound.  There was concern about atelectasis in the left base on CT  scan dated May 6 and therefore he is seen at Dr. Debby Bud' request.  The  patient in the meantime feels a 100% back to normal, having taken a  course of antibiotics which now includes a completion of cefuroxime  (about 10 days total).  He denies specifically any history of  hemoptysis, pleuritic pain or ongoing dyspnea, cough (other than a  little white mucus), congestion (other than a little thick mucus) and  denies any dyspnea or unintended weight loss.   PAST MEDICAL HISTORY:  Is significant for remote laryngeal cancer status  post radiation therapy only.  He also has a history of hypertension,  gout, and peripheral vascular disease, as well as an elevated PSA.  Past  medical history is significant for a chronic renal insufficiency,  alcoholism, peptic ulcer disease, peripheral vascular disease and a  diagnosis of iron-deficiency anemia. Elevated PSAs, unclear  significance.   ALLERGIES:  None known.   MEDICATIONS:  1. Allopurinol 300 mg 1 daily.  2. Baby aspirin 1 daily.  3. Potassium 20 mEq daily.  4. Furosemide 40 mg daily.  5. Metoprolol 200 mg daily.   SOCIAL HISTORY:   He lives alone.  He still smokes a half pack per day.  His family looks after him.  He is still actively drinking, maybe 4  drinks a day.   FAMILY HISTORY:  Is negative for cancer except in himself.   REVIEW OF SYSTEMS:  Taken in detail on the work sheet.  Negative except  as outlined above.   PHYSICAL EXAMINATION:  This is a stoic ambulatory black male in no acute  distress.  Stable vital signs.  Weight of 152 pounds which compares to 155 pounds  documented December 31, 2006.  HEENT:  Upper and lower dentition are intact.  NECK:  Is supple without cervical adenopathy or tenderness. Trachea is  midline. No obvious thyromegaly  Lung fields reveal diminished breath sounds without wheezing.  There is  minimal dullness and decreased breath sounds at the left base  posteriorly, which seems a bit asymmetric compared to the right.  No  pleural rub nor a localized wheeze or rhonchi however were noted.  Regular  rate and rhythm without murmur, gallop, rub or increase in P2.  ABDOMEN:  Is soft, benign without no palpable organomegaly, masses or  tenderness.  Femoral pulses were present.  Hoover's sign was negative.  EXTREMITIES:  Are warm without calf tenderness, cyanosis, clubbing, or  edema.   Chest x-ray was reviewed from March 11, 2007 indicates worsening  aeration in the left base, verses a baseline in 2006 that showed normal  aeration of left base.   IMPRESSION:  Persistent atelectasis in the left base with decreased  breath sounds and a very small pleural effusion.  This is worrisome for  lung cancer with a post obstructive pneumonia with volume loss in a  patient that is a long standing smoker with a history of laryngeal  cancer.  However, the patient states he feels 100% better and at age  81 I am not inclined to aggressively evaluate what may simply be a slow  to resolve pneumonia.   I did discuss the differential diagnosis briefly with the patient and  his son and emphasized  followup in 2 weeks with a chest x-ray here.  At  that time, we will need to consider bronchoscopy if there has been no  further improvement.     Charlaine Dalton. Sherene Sires, MD, [facility]  Electronically Signed    MBW/MedQ  DD: 04/06/2007  DT: 04/06/2007  Job #: [id number]

## 2011-04-07 NOTE — Assessment & Plan Note (Signed)
[facility]                             PULMONARY OFFICE NOTE   REDFORD, BEHRLE                          MRN:          [id number]  DATE:04/15/2007                            DOB:          03/22/1920    HISTORY:  An 77 year old, black male who states he quit smoking a week  ago and was seen for evaluation of possible persistent pneumonia on  chest x-ray with sputum that had become yellow but now is completely  resolved after a course of antibiotics. His main complaint now is he  feels weak and unable to gain any weight. He denies any pleuritic  pain, fevers, chills, sweats, orthopnea, PND or leg swelling or ongoing  cough.   PHYSICAL EXAMINATION:  GENERAL:  He is a thin, ambulatory, black male  who has lost 4 pounds from previous visit 2 weeks ago down to 148.  HEENT:  Unremarkable. Pharynx clear.  NECK:  Supple without cervical adenopathy or tenderness. The trachea is  midline, no thyromegaly.  LUNGS:  Lung fields reveal diminished breath sounds at the left base.  There is no localized, generalized or otherwise wheezing or rhonchi.  HEART:  He has a regular rhythm without murmur, gallop or rub.  ABDOMEN:  Soft and benign.  EXTREMITIES:  Warm without calf tenderness, cyanosis, clubbing or edema.   Chest x-ray is pending.   IMPRESSION:  Recent pneumonia clinically with persistent failure to  thrive may represent post obstructive pneumonia from a malignancy in  this long-term smoker who for some reason suddenly decided to quit  smoking a week ago (I found this to be a poor prognostic sign as most  patients who are feeling better resume smoking) back to baseline after  acute illness like this.   I therefore recommended consideration for bronchoscopy and spent extra  time discussion risks, benefits, and alternatives. I will contact the  patient after the Memorial Day holiday and I have a chance to review his  chest x-ray to give him a specific  time. Tentatively he did agree to  proceed with bronchoscopy at the next available date.     Charlaine Dalton. Sherene Sires, MD, [facility]  Electronically Signed    MBW/MedQ  DD: 04/15/2007  DT: 04/15/2007  Job #: [id number]   cc:   Rosalyn Gess. Norins, MD

## 2011-04-07 NOTE — Op Note (Signed)
Wesley [name], Wesley [name]                 ACCOUNT NO.:  [id number]   MEDICAL RECORD NO.:  [id number]          PATIENT TYPE:  AMB   LOCATION:  CARD                         FACILITY:  [facility]   PHYSICIAN:  Casimiro Needle B. Sherene Sires, MD, FCCPDATE OF BIRTH:  04/27/22   DATE OF PROCEDURE:  05/11/2007  DATE OF DISCHARGE:                               OPERATIVE REPORT   PROCEDURE:  Fiberoptic bronchoscopy with a Wang biopsy of the left lower  lobe orifice.   REFERRING PHYSICIAN:  Rosalyn Gess. Norins, MD.   HISTORY AND INDICATIONS:  Please see pulmonary notes in the E-chart.  There has been no change in the exam or history since the time of these  notes.  Bronchoscopy is being performed because of persistent  atelectasis in the left base following a diagnosis of pneumonia earlier  in the year.  The patient agreed to the procedure after full discussion  of the risks, benefits and alternatives.   The procedure was performed in the bronchoscopy suite with continuous  monitoring by surface ECG and oximetry.  He received a total of 5 mg IV  Versed and 50 mg IV Demerol for added sedation and cough suppression.   A right nares was easily cannulated with good visualization of the  entire pharynx and larynx.  The cords appeared to move normally and  there were no apparent upper airway lesions.   Using additional 1% lidocaine, as needed, the entire tracheobronchial  tree was explored bilaterally with the following findings.  All the  airways were opened widely subsegmental except for the left lower lobe  superior segment.  Here there appeared to be a bulge with approximately  30% obstruction of the superior segmental orifice, but I could pass the  scope easily beyond it and the segments of the left lower lobe superior  segment appeared to open very well with no focal process beyond the  orifice.  However, the mucosa over the bulge appeared normal with normal  striations which would indicate no mucosal involvement.   The appearance  was more of a congenital abnormality than a definite mass.  However,  because this is the area of concern on chest x-ray and CT scan, I did  three biopsies with a Wang technique with minimal bleeding directly at  the base of the bulge.   The patient tolerated the procedure well and will be observed until he  is alert and saturated well on room air.  Follow-up will be arranged in  the pulmonary clinic.   IMPRESSION:  Persistent atelectasis in the left base following recent  pneumonia.  It is not clear at this point whether the partial  obstruction of the superior segment has anything to do with this.  If  the biopsies are negative, I would not pursue it further other than a  follow-up chest x-ray perhaps at three months.      Charlaine Dalton. Sherene Sires, MD, [facility]  Electronically Signed     MBW/MEDQ  D:  05/11/2007  T:  05/11/2007  Job:  [id number]   cc:   Rosalyn Gess. Norins, MD  [address]  [place] [address]

## 2011-04-07 NOTE — Discharge Summary (Signed)
Wesley [name], Wesley [name]                 ACCOUNT NO.:  [id number]   MEDICAL RECORD NO.:  [id number]          PATIENT TYPE:  OBV   LOCATION:  1441                         FACILITY:  [facility]   PHYSICIAN:  Rosalyn Gess. Norins, MD  DATE OF BIRTH:  [birth year]   DATE OF ADMISSION:  10/20/2007  DATE OF DISCHARGE:  10/21/2007                               DISCHARGE SUMMARY   ADMITTING DIAGNOSIS:  1. Diarrhea.  2. Renal insufficiency.  3. Peripheral vascular disease with a history of lower extremity      stents.  4. Laryngeal cancer in remission.  5. History of gastric outlet obstruction leading to gastric      jejunostomy with truncal vagotomy and cholecystectomy.   HISTORY OF PRESENT ILLNESS:  No admission note is available to me at the  time of this dictation.  The patient was evidently suffering from  diarrhea for several days.  He presented to the emergency department for  this problem.  He also had a complaint of chest pain.  He reports it  comes with exertion and last for several seconds.  The pain is recurrent  and in the substernal region.   Admitting exam in the emergency department revealed the patient to have  normal O2 sats.  He was a very thin, elderly, black gentleman who  appeared to be in no acute distress.  HEENT exam was unremarkable.  Abdomen was soft, nontender, nondistended.  Chest was unremarkable.  Neurologic exam was unremarkable.  Laboratory in the ER revealed a  hemoglobin of 13.7 grams and a white count 5400, platelet count of  191,000.  Chemistries revealed sodium 133, potassium 5.1, chloride 104,  CO2 19, glucose was 90, BUN 37, creatinine 2.23 up from a baseline of  1.2, calcium was 8.9, GFR was low at 34 mL per minute.  CK-MB was 5.4.  Troponin I was less than 0.05.  Second set of enzymes with a CK of 5.4  and Troponin I of less than 0.05 on repeat.  Because of his diarrhea and  renal insufficiency, the patient was subsequent admitted to hospital for  IV  hydration.   HOSPITAL COURSE:  1. The patient was admitted to a telemetry unit.  Cardiac enzymes were      recycled with a final CK on day of discharge of 107.  The patient's      EKG and telemetry remained normal.  2. Diarrhea.  The patient was given Imodium with good results.  He      reports he has had no loose or diarrheal stools since being in      hospital.  3. Acute renal insufficiency.  The patient's initial creatinine of      2.23 was down to 1.76 at time of discharge.  He is taking a diet      well.   With the patient's cardiac enzymes being negative x3, with his  creatinine improving and diarrhea being controlled, he is thought to be  stable and ready for discharge home.   DISCHARGE EXAMINATION:  Temperature was 97.6, blood pressure was 80/47  at 0600  hours, heart rate was 70, respirations were 18, and O2 sat was  1% on room air.  General Appearance:  This is a slender black gentleman  sitting up in bed in no acute distress.  Chest was clear. repeat BP by  dictator 118/60. No further exam conducted.   DISCHARGE DISPOSITION:  The patient is to be discharged home.  He is to  follow up with laboratory in my office on Monday, November 31, 2008.  He  will be seen for followup on Tuesday, October 24, 2007.  We will  check the patient's blood pressure before discharge to make sure that he  is not hypotensive at the time of discharge, and if he is, we will  cancel discharge and hold him for another day.   CONDITION AT TIME OF DISCHARGE DICTATION:  Clinically stable.      Rosalyn Gess Norins, MD  Electronically Signed     MEN/MEDQ  D:  10/21/2007  T:  10/21/2007  Job:  [id number]

## 2011-04-07 NOTE — Assessment & Plan Note (Signed)
[facility]                                 ON-CALL NOTE   JARYAN, CHICOINE                          MRN:          [id number]  DATE:05/29/2009                            DOB:          1919/11/30    Time is 08:40 p.m.   Caller is Lyndal Rainbow at [phone].   Regular doctor is Dr. Debby Bud   The patient is calling because he has severe pain in his knee.  He said  it is from gout and he said he cannot wait until morning to be seen  because his pain is severe.  He wants a pain shot of some sort.  I told  him that the only way to get seen after hours is go to either an urgent  care or the emergency room.  He refuses to go to [facility], so he said he  will go to [facility] Emergency Room for evaluation.     Marne A. Tower, MD  Electronically Signed    MAT/MedQ  DD: 05/29/2009  DT: 05/30/2009  Job #: [id number]

## 2011-04-07 NOTE — Discharge Summary (Signed)
Wesley [name], Wesley [name]                 ACCOUNT NO.:  [id number]   MEDICAL RECORD NO.:  [id number]          PATIENT TYPE:  INP   LOCATION:  1414                         FACILITY:  [facility]   PHYSICIAN:  Rosalyn Gess. Norins, MD  DATE OF BIRTH:  1920-04-19   DATE OF ADMISSION:  03/29/2007  DATE OF DISCHARGE:  03/31/2007                               DISCHARGE SUMMARY   ADMISSION DIAGNOSES:  1. Weakness and pleural effusion.  2. Iron-deficiency anemia.  3. Peripheral vascular disease.   DISCHARGE DIAGNOSES:  1. Weakness and pleural effusion.  2. Iron-deficiency anemia.  3. Peripheral vascular disease.   CONSULTANTS:  None.   PROCEDURES:  1. Chest x-ray May 6, which showed increased left effusion with left      base atelectasis and/or pneumonia.  2. Ultrasound the chest May6,2008, which did not reveal a      significant volume of pleural fluid; therefore, thoracentesis not      performed.  3. CT of the chest without contrast May6,2008, which showed left      lower lobe consolidation and collapse with a background pattern of      centrilobular emphysema.   HISTORY OF PRESENT ILLNESS:  Mr. Dunnam recently hospitalized April 16-20  for left lower lobe pneumonia and hyponatremia and was discharged home  in good condition 2 weeks prior to this return admission.  The patient  reports that he became weak and was unable to say warm and for these  reasons came to the emergency department.  He did report he had an  ongoing cough.  In the emergency department he did have some increased  shortness of breath.  He was noted to have on x-ray what appeared to be  a significant left lower lobe effusion.  He was subsequently admitted  for care.   Please see the recent hospital discharge summary and the handwritten  admit note for past medical history, family history and social history.   Admission examination was significant for temperature of 97.2, O2  saturation was 95% on room air, respiratory rate  was 22.  Lungs:  The  patient had decreased breath sounds at the left base but was otherwise  clear.   Laboratory at admission revealed a white count of 6400, hemoglobin 12.6  g.  BNP was 111.  Chemistries were normal with creatinine 1.2 and a  potassium of 4.1.   HOSPITAL COURSE:  PULMONARY:  The patient was sent to radiology, where ultrasound was  performed.  There was not adequate fluid for thoracentesis and therefore  this procedure was cancelled.  CT scan did reveal that the patient had  consolidation and collapse of the left lower lobe.  The patient,  however, was doing well in regard to respiratory function.  He had no  increased shortness of breath.  He was oxygenating adequately.  It was  felt that he would be stable to be discharged home with follow-up with  pulmonary as an outpatient.  He will continue a course of Ceftin for a  complete course of antibiotics for an additional 5 days.   The  patient's iron-deficiency anemia was stable.  Peripheral vascular  disease was stable.  Peptic ulcer disease was stable.   DISCHARGE EXAMINATION:  GENERAL:  The patient was encountered ambulating  in the hall, going at a moderate rate with no increased shortness of  breath.  vital signs:  Temperature 97.3, blood pressure 115/65, heart rate 63,  respirations 14, O2 saturation is 100% on room air.  CHEST:  The patient is moving air well except for decreased breath  sounds at the left base.  CARDIOVASCULAR:  A regular rate and rhythm without murmurs.   No further exam conducted.   FINAL LABORATORY:  Ferritin level was 347, which was slightly elevated.  Serum iron level was 27, which is significantly low.  B12 level was 286,  which is normal range.  BMET from [facility] revealed a sodium of 134,  potassium 3.6, chloride 102, CO2 25, glucose was 95, BUN 7, creatinine  of 1.  CBC May7 was notable for hemoglobin of 11.3 g, white count  4200.   DISPOSITION:  The patient is discharged home.  He  will be scheduled to  see pulmonary as an outpatient.   DISCHARGE MEDICATIONS:  The patient will continue on his home  medications including:   1. Toprol XL 200 mg daily.  2. Potassium 20 mEq daily.  3. Allopurinol 300 mg daily.  4. Lasix 40 mg daily.  5. Aspirin 81 mg daily.  6. Ceftin will be continued 250 mg b.i.d. for an additional 5 days.   DISPOSITION:  To send the patient home.   CONDITION AT TIME OF DISCHARGE:  Stable and improved.      Rosalyn Gess Norins, MD  Electronically Signed     MEN/MEDQ  D:  03/31/2007  T:  03/31/2007  Job:  [id number]

## 2011-04-10 NOTE — Discharge Summary (Signed)
Wesley [name], Wesley [name] NO.:  [id number]   MEDICAL RECORD NO.:  [id number]          PATIENT TYPE:  OBV   LOCATION:  1411                         FACILITY:  [facility]   PHYSICIAN:  Rosalyn Gess. Norins, M.D. LHCDATE OF BIRTH:  March 06, 1920   DATE OF ADMISSION:  08/08/2005  DATE OF DISCHARGE:  08/09/2005                                 DISCHARGE SUMMARY   CHIEF COMPLAINT:  Weakness and diarrhea.   HISTORY OF PRESENT ILLNESS:  The patient was recently hospitalized for a  duodenal obstruction status post truncal vagotomy, gastrojejunostomy, open  cholecystectomy. The patient did require a J tube feeding for some time but  was taking p.o. when he was discharged home.   Since being home, the patient has had a poor appetite secondary in part to  poor fitting dentures. He also reports that food has had no taste. He has  had some difficulty with swallowing. The patient was in the ER in mid August  for nausea and vomiting and discharged home. He now represents because of  weakness and watery diarrhea. In the emergency department, he was clinically  dry with an elevated BUN and creatinine is now admitted for IV fluids.   Past medical history and family history is well documented in the previous  admission notes.   SOCIAL HISTORY:  The patient does live alone and manages his ADLs. He  reports that he has starting drinking some beer.   PHYSICAL EXAM:  VITAL SIGNS:  At admission, temperature 97.5, blood pressure  109/68, pulse 73, respirations 24.  GENERAL APPEARANCE:  This is a thin, elderly black male in no acute  distress.  HEENT:  Normocephalic, atraumatic. Conjunctiva and sclera were muddy. His  has arcus senilis bilaterally. He is edentulous with ill fitting dentures.  NECK:  Supple, there was no thyromegaly.  NODES:  No adenopathy was noted in the submental cervical, supraclavicular  regions.  CHEST:  No CVA tenderness. The patient is thin.  LUNGS:  Clear with no rales,  wheezes or rhonchi.  CARDIOVASCULAR:  2+ radial pulse with a regular rate and rhythm without  murmurs.  ABDOMEN:  The patient has a ventral surgical scar that was well-healed and  PEG site at the left abdomen level of the umbilicus with a scant amount of  exudate but nontender. Ventral midline drain site well healed. The patient  had positive bowel sounds in all four quadrants. There was no guarding or  rebound. No organosplenomegaly.  GENITALIA:  Normal male.  RECTAL:  Deferred.  EXTREMITIES:  Very __________ without deformity.  NEUROLOGIC:  Nonfocal.   ADMISSION LABORATORY:  Hemoglobin 10.9 grams, white count was 6200 with a  normal differential. Chemistries with a BUN of 36, creatinine 2.7, but  otherwise normal. Lipase was 61, beta natruretic peptide 85.9, CK-MB 2.3.  Urinalysis was negative. CT of the brain was negative. Chest x-ray with COPD  and no active disease.   HOSPITAL COURSE:  The patient was admitted. He was hydrated with IV fluids.  Follow-up basic metabolic panel on the day of discharge showed a creatinine  1.4,  normal potassium and sodium.   PLAN:  1.  The patient is to continue on Questran at home to control diarrhea since      he is going to continue with Ensure I will continue proton pump      inhibitor at discharge. The patient does need a visit to dentist and get      refitted dentures.  2.  Acute renal insufficiency resolved with creatinine going from 2.71 to      1.4 with hydration.  3.  Oncology. The patient does have a history of laryngeal CA. With his      difficulties with swallowing and weighting loss wasting will schedule      him for a CT scan of the neck before discharge to rule out recurrent CA.      The patient will be notified of the results as an outpatient since the      results will not be available by the time of discharge.   The patient's condition at time of discharge is stable and improved.           ______________________________   Rosalyn Gess Norins, M.D. [facility]     MEN/MEDQ  D:  08/09/2005  T:  08/10/2005  Job:  [id number]

## 2011-04-10 NOTE — Op Note (Signed)
Wesley [name] NO.:  [id number]   MEDICAL RECORD NO.:  [id number]          PATIENT TYPE:  INP   LOCATION:  5504                         FACILITY:  [facility]   PHYSICIAN:  Clovis Pu. Cornett, M.D.DATE OF BIRTH:  May 26, 1920   DATE OF PROCEDURE:  06/23/2005  DATE OF DISCHARGE:                                 OPERATIVE REPORT   PREOPERATIVE DIAGNOSES:  1.  Gastric outlet obstruction secondary to peptic ulcer disease.  2.  Cholelithiasis.   POSTOPERATIVE DIAGNOSES:  1.  Gastric outlet obstruction secondary to peptic ulcer disease.  2.  Cholelithiasis.   OPERATION PERFORMED:  1.  Truncal vagotomy.  2.  Gastrojejunostomy.  3.  Open cholecystectomy with intraoperative cholangiogram.  4.  Feeding Stamm gastrostomy (Moss gastrojejunostomy tube used).   SURGEON:  Maisie Fus A. Cornett, M.D.   ASSISTANT:  Ollen Gross. Carolynne Edouard, M.D.   ANESTHESIA:  General endotracheal anesthesia.   DRAINS:  None.   SPECIMENS:  1.  Anterior and posterior vagotomy verified by frozen section.  2.  Gallbladder with gallstones.   INDICATIONS FOR PROCEDURE:  The patient is an 77 year old male admitted for  upper abdominal pain.  Work-up included an EGD as well as CT scanning.  He  was found to have gastric outlet obstruction with a stenotic duodenum  secondary to peptic ulcer disease.  After discussion of options for him, he  elected to undergo surgical bypass and truncal vagotomy for peptic ulcer  disease.  Of note, he had gallstones that were discovered at the same time  and cholecystectomy was recommended as well for this.  I also recommended a  feeding tube, given his albumen of 2.5 and his history of chronic alcohol  abuse.  The patient agreed to all of the above.   DESCRIPTION OF PROCEDURE:  The patient was brought to the operating room and  placed supine.  After induction of general endotracheal anesthesia, the  abdomen was prepped and draped in a sterile fashion. A Foley catheter  tube  was in place and a nasogastric tube was placed.  He received preoperative  antibiotics.  An upper abdominal incision was made from xiphoid to just  above the umbilicus.  Dissection was carried down to the subcutaneous fat to  the linea alba, which was opened with the cautery.  Hemostats were used to  grasp the peritoneum.  A  small incision was made with scissors.  We then  took down the remainder of the peritoneum into the abdominal cavity.  The  patient was then placed in reversed Trendelenburg.  A Bookwalter retractor  was used for retraction.  The attachments of the left lobe of the liver were  taken down to better expose the hiatus of the esophagus. Once I did this, we  took down the loose connective tissue at the gastroesophageal junction and  exposed the esophagus circumferentially. A quarter inch Penrose drain was  passed around the esophagus and this was pulled down for traction. Next, the  anterior vagus was easily identified. This was dissected up approximately 2  to 3 cm above the gastroesophageal junction  on the esophagus.  A clip was  placed distally on the proximal vagus nerve and then divided there.  A clip  was placed distal and the nerve segment was transected for about 1 cm.  The  posterior vagus was identified on the posterior aspect and this was again  dissected up 2 to 3 cm up on the esophagus well away from the  gastroesophageal junction.  Clips were used to mark it and it was then  divided and it was sent to pathology. Frozen section revealed this to be  consistent with nerve.  Hemostasis was excellent.   Next, the gallbladder was identified.  This was taken down due to stones.  The dome was grasped and the gallbladder was dissected out of the  gallbladder fossa using electrocautery in a dome down fashion. This was  taken all the way down until the cystic duct was identified. The cystic  artery was identified, doubly clipped and divided.  Once the cystic duct  was  identified entering the gallbladder it was the only tubular structure  entering the gallbladder.  A clip was placed on the gallbladder side of this  and a small incision was made in the cystic duct using Metzenbaum scissors.  A Cook catheter was then used and half strength Hypaque dye was used.  Intraoperative cholangiogram was obtained using spot fluoroscopy with  contrast.  The cystic duct had a low insertion on the common duct but flowed  freely into the common duct.  The bifurcation of the common duct in the  right and left were identified.  Free flow of contrast was noted in the  duodenum.  Radiologist also reviewed it and stated that the cystic duct  inserted quite low but did not see any obstruction.  There was some light  filling of the common duct into the right and left hepatic ducts but I could  see this on fluoroscopy pretty well and was confident there was no evidence  of any obstruction.  At this point the cholangiogram was done.  Catheter was  removed and the cystic stump was ligated with a 3-0 Vicryl.  The gallbladder  was then passed off the field.  Surgicel was then placed to help control the  oozing from the gallbladder bed.   Next, the gastrojejunostomy portion of the procedure was done.  We found the  ligament of Treitz and traced this down.  At about 20 cm was a jejunal  diverticulum, so I went distal to that at about 30 cm and felt that this was  the most appropriate place to pull up the jejunum for a retrocolic  gastrojejunostomy.  The omentum, though was tacked down in the left and  right lower quadrants and the omentum had to be mobilized due to previous  scarring and diverticulosis it looked like.  Once we mobilized the omentum  up, we then could pick up the transverse colon.  We transilluminated this  just to the left of the middle colic artery.  This was a nice window and we  created a defect in the mesentery to create a retrocolic gastrojejunostomy. The  segment of bowel approximately 30 cm from the ligament of Treitz was  then brought up through the mesenteric defect and it fit quite nicely.  Next, a side-to-side anastomosis of the jejunum and stomach were made.  I  picked a portion of the most dependent inferior portion of the stomach wall.  I used 3-0 Vicryl to line up the small  bowel.  Two small enterotomies were  made, one in the stomach and one in the small bowel and a GIA 60 stapler was  used to create the anastomosis.  I then closed the enterotomy with  interrupted 3-0 suture in a single layer.  Tisseel was then applied to both  the anterior and posterior aspects of the anastomosis.  Next, through a  separate stab incision in the left upper quadrant, a Moss gastrojejunostomy  feeding tube was then passed through the abdominal wall. The balloon was  tested and worked well.  A perfect place in the midbody of the stomach was  chosen and a pursestring suture of 3-0 Vicryl was placed.  Gastrotomy was  made and I then passed the gastrostomy tube through the gastrotomy and  carefully passed it into the efferent limb of the gastrojejunostomy.  Our  initial attempt at doing this failed because the tube coiled and pulled back  and we had to readjust the tube.  I took the pursestring suture back out and  replaced the tube under direct palpation, again carefully passed it into the  efferent limb of the gastrojejunostomy.  This time, we held the tip of the  tube well down into the distal jejunum and put a second pursestring suture  around it.  The balloon was inflated and pulled up snug to the gastric wall.  Next, the stomach was tacked to the undersurface of the abdominal wall with  four 3-0 Vicryl sutures.  The balloon was pulled up snug.  I then secured  the tube to the skin on the outside using a 3-0 nylon.  At this point the  balloon was pulled up snug to the gastric wall with no undue tension.  I  then re-examined the anastomosis and it was  found to be nondisrupted and  there was no tension on the anastomosis whatsoever.  I placed three sutures  to the undersurface of the mesentery to the small bowel to keep it from  herniating up into the lesser sac using 3-0 Vicryl.  It felt patent with no  evidence of stenosis and lay very nicely.  At this point irrigation was used  which was suctioned up.  Hemostasis was excellent.  The fascia was then  closed with a running double stranded  #1 PDS.  Staples were used to close  the skin. The wire was removed from the feeding tube and the port was  plugged.  All sponge, needle and instruments were counted and were found to  be correct for this portion of the case.  The patient was awakened, taken to  recovery in satisfactory condition.  The estimated blood loss was  approximately 100 mL.       TAC/MEDQ  D:  06/23/2005  T:  06/24/2005  Job:  [id number]

## 2011-04-10 NOTE — Discharge Summary (Signed)
NAMETYRIE, PORZIO NO.:  [id number]   MEDICAL RECORD NO.:  [id number]          PATIENT TYPE:  INP   LOCATION:  6708                         FACILITY:  [facility]   PHYSICIAN:  Rene Paci, M.D. LHCDATE OF BIRTH:  Nov 27, 1919   DATE OF ADMISSION:  02/09/2006  DATE OF DISCHARGE:  02/11/2006                                 DISCHARGE SUMMARY   DISCHARGE DIAGNOSIS:  1.  Hypotension/dehydration.  2.  Acute on chronic renal insufficiency.   HISTORY OF PRESENT ILLNESS:  The patient is an 77 year old African American  male seen as an outpatient by Drs.  Norins and [place] who presented with  a several day history of anorexia and diarrhea which resulted in dehydration  and hypotension.  The patient reports chronic intermittent diarrhea since  his cholecystectomy.  He also complains of leg swelling.  The patient was  admitted for rehydration and to undergo a VQ scan as well as venous Dopplers  to rule out DVT and PE.   PAST MEDICAL HISTORY:  1.  Hypotension.  2.  Dehydration.  3.  Alcohol abuse.  4.  Chronic renal insufficiency.  5.  Peptic ulcer disease.  6.  Peripheral vascular disease status post stenting.  7.  History of laryngeal cancer.   HOSPITAL COURSE:  Problem 1:  Hypotension/dehydration.  The patient was admitted, his blood  pressure medications were held.  Blood pressure improved with IV hydration.  He had no clear signs of infection, though he was empirically started  initially on Cipro.  He has remained afebrile during his hospitalization and  no lymphocytosis was noted.  Serial cardiac enzymes were negative x 3.  The  patient underwent a VQ scan which showed low probability for PE.  He also  underwent lower extremity Doppler which showed no evidence of DVT.  The  patient is currently feeling much better.  Blood pressure at the time of  discharge is 114/64.  At this time, we will continue to hold his Toprol and  ACE inhibitors.  These may need to  be restarted at lower doses as an  outpatient.   Problem 2:  Acute on chronic renal insufficiency.  Creatinine on admission  was 2.  On hospitalization day two, creatinine was down to 1.8 with  hydration.  This will need to be repeated as an outpatient.   PERTINENT DISCHARGE LABORATORY DATA:  RPR negative.  Hemoglobin 10.4,  hematocrit 30, BUN 32, creatinine 1.8, sodium 139, potassium 4.5.   DISCHARGE MEDICATIONS:  1.  Toprol XL 100 mg p.o. daily, the patient is instructed to hold until he      follows up with Dr.  Debby Bud.  2.  Aspirin 81 mg p.o. daily.  3.  Lisinopril 40 mg p.o. daily, hold until he follows up with Dr. Debby Bud.   DISPOSITION:  The plan is to transfer the patient to home.  He is instructed  to follow up with Dr. Debby Bud on Thursday, March 29, at 11 a.m.  He is also  instructed to call Dr. Debby Bud should he develop fever over 101, weakness, or  diarrhea.      Melissa S. Peggyann Juba, NP      Rene Paci, M.D. [facility]  Electronically Signed    MSO/MEDQ  D:  02/11/2006  T:  02/12/2006  Job:  [id number]   cc:   Rosalyn Gess. Norins, M.D. [facility]  [address]  [place]  [place] [address]

## 2011-04-10 NOTE — Discharge Summary (Signed)
[name], [name] NO.:  [id number]   MEDICAL RECORD NO.:  [id number]          PATIENT TYPE:  INP   LOCATION:  6708                         FACILITY:  [facility]   PHYSICIAN:  Rene Paci, M.D. LHCDATE OF BIRTH:  [birth year]   DATE OF ADMISSION:  02/09/2006  DATE OF DISCHARGE:                                 DISCHARGE SUMMARY   ADDENDUM:  The patient was also noted to have a low iron level with a level  of 28 and a hemoglobin of 10.4 which is the patient's baseline.  He will be  started on p.o. iron supplement at the time of discharge.      Melissa S. Peggyann Juba, NP      Rene Paci, M.D. [facility]  Electronically Signed    MSO/MEDQ  D:  02/11/2006  T:  02/12/2006  Job:  [id number]

## 2011-04-10 NOTE — Op Note (Signed)
   NAMEDMAURI, ROSENOW                             ACCOUNT NO.:  [id number]   MEDICAL RECORD NO.:  [id number]                   PATIENT TYPE:  AMB   LOCATION:  DSC                                  FACILITY:  [facility]   PHYSICIAN:  Kristine Garbe. Ezzard Standing, M.D.         DATE OF BIRTH:  Nov 13, 1920   DATE OF PROCEDURE:  02/13/2003  DATE OF DISCHARGE:                                 OPERATIVE REPORT   PREOPERATIVE DIAGNOSIS:  Vocal cord lesion, rule out cancer.   POSTOPERATIVE DIAGNOSIS:  Vocal cord lesion, rule out cancer.   OPERATION PERFORMED:  Microlaryngoscopy and biopsy.   SURGEON:  Kristine Garbe. Ezzard Standing, M.D.   ANESTHESIA:  General endotracheal.   COMPLICATIONS:  None.   INDICATIONS FOR PROCEDURE:  The patient is an 77 year old gentleman who has  history of tobacco use as well as alcohol use.  He has had hoarseness now  for over four months that has been fairly constant.  On examination, he has  abnormal appearance of bilateral vocal cords and he is taken to the  operating room at this time for direct laryngoscopy and biopsy.   DESCRIPTION OF PROCEDURE:  After adequate endotracheal anesthesia, direct  laryngoscopy was performed.  Base of tongue and glottis all appear normal.  Both piriform sinuses were clear.  On evaluation of the vocal cords, the  patient had bilateral irregular vocal cords with what appears to be  neoplastic versus inflammatory changes in both vocal cords.  A biopsy of the  right vocal cord was obtained first and then a biopsy of the left vocal cord  was obtained.  A cotton pledget soaked in Adrenalin was used for hemostasis.  Biopsies were sent to [facility].  This completed the procedure.  The  patient was subsequently awakened from anesthesia and transferred to  recovery room postoperatively doing well.   DISPOSITION:  The patient is discharged to home later this morning on  Tylenol p.r.n. pain along with his regular hypertensive medications.  Will  have  him follow up in my office in five days for recheck, review pathology  and plan further therapy.                                               Kristine Garbe. Ezzard Standing, M.D.    CEN/MEDQ  D:  02/13/2003  T:  02/13/2003  Job:  [id number]   cc:   Rosalyn Gess. Norins, M.D. [facility]

## 2011-04-10 NOTE — Discharge Summary (Signed)
Wesley [name], [name]                             ACCOUNT NO.:  [id number]   MEDICAL RECORD NO.:  [id number]                   PATIENT TYPE:  OIB   LOCATION:  6533                                 FACILITY:  [facility]   PHYSICIAN:  Richard A. Alanda Amass, M.D.          DATE OF BIRTH:  1920-02-02   DATE OF ADMISSION:  12/11/2003  DATE OF DISCHARGE:  12/12/2003                                 DISCHARGE SUMMARY   ADMISSION DIAGNOSES:  1. Peripheral vascular disease.  2. Left bundle branch block.  3. Chronic obstructive pulmonary disease.  4. Hypertension.  5. History of laryngeal cancer.  6. Ongoing smoker.   DISCHARGE DIAGNOSES:  1. Peripheral vascular disease.  2. Left bundle branch block.  3. Chronic obstructive pulmonary disease.  4. Hypertension.  5. History of laryngeal cancer.  6. Ongoing smoker.  7. Status post peripherovascular angiogram on December 11, 2003, by Dr.     Alanda Amass.     A. Right superficial femoral artery tandem stenting.     B. Left proximal superficial femoral artery stenting.     C. Left common iliac artery stenting.     D. Residual 50% right common iliac artery; 70% right renal artery        stenosis; 40% left renal artery stenosis; diffuse mid distal left        superficial femoral artery disease.  8. Status post CT scan to follow up past pulmonary nodules.  This reveals     3.3 x 2.0 cm mass-like finding that could also be duodenal protrusion.     Radiologist cannot determine between the two and recommends follow-up     upper GI or endoscopy to see whether it is a mass or mucosal abnormality.     In regards to the lung nodules, the previous 8 mm right lower lobe nodule     is now smaller and is about 6 mm.  As well the previous left basilar     densities are now smaller and some have resolved.  There is underlying     COPD as well.  At the time of the CT results, the patient had already     been released home without discharge orders.  Therefore our office  is     scheduling the patient follow-up GI evaluation to determine whether he     should upper GI studies versus EGD and this is per Dr. Jenne Campus.   HISTORY OF PRESENT ILLNESS:  The patient is an 77 year old African-American  male who had been referred to Dr. Alanda Amass through the courtesy of Sherlynn Stalls, M.D. for evaluation of PVD and abnormal lower extremity Dopplers,  nonhealing ulcer on the right lateral fifth toe, lower extremity ambulatory  weakness compatible with claudication.   See Dr. Kandis Cocking office report for all findings and details.  His  examination was notable for a fibrotic-type nonhealing corn-like ulcer on  the right fifth toe  laterally.  A dorsalis pedis was absent bilaterally.  He  had faint left posterior tibial 1+ and faint right posterior tibial 0.5+.  There was no edema.  Femorals were 1+ bilaterally with soft bruits.   At that time, Dr. Alanda Amass also reviewed his Doppler studies.  He planned  for blood pressure control, smoking cessation, and plan to follow up lipids.  He felt that the patient needed lower extremity angiography beginning with  the right side.  He explained risks and benefits of the procedure and  intervention with the patient and his family and they were agreeable to  proceed.   HOSPITAL COURSE:  On December 11, 2003, the patient underwent PV angiography  by Dr. Alanda Amass.  Please see the dictated report as it is quite complex.  He performed the following:  1) Right SFA tandem stenting.  This went from  85% to less than 10% residual.  2) left proximal SFA stenting.  This went  from 95% to less than 10% residual.  3) Left common iliac artery stenting.  This went from 90% to 10%.  4) He had residual 50% RCIA; 70% RRA; 40% LRA;  diffuse mid distal left SFA disease.  This is all planned for medical  therapy with aspirin, Plavix, blood pressure medicines, lipid medicines, and  smoking cessation.   He tolerated the procedure well without  complications.  Dr. Alanda Amass  planned to obtain outpatient Dopplers of his lower extremities and renal  arteries as well as see him back in the office.  As well, he scheduled him  for a CT scan in the morning to follow up past pulmonary nodules.   On the morning of December 12, 2003, the patient was stable.  He was seen by  Dr. Allyson Sabal.  He was planned for his follow-up CT scan and otherwise felt to  be stable for discharge home after this.  Please note that I did start  working on his paperwork for his discharge home, but did not complete it  prior to the patient being taken down to radiology for a CT scan.  In the  meantime, I went down to radiology trying to find the CT scan results as  soon as possible to review with the radiologist, but before I could even  review them and get back to the floor with the results, the nursing staff  had allowed the patient to leave.  They had not called me to let me know  that he was leaving against medical advice or called to ask to request me to  come up there as soon as possible.  Instead, when I went back up there with  my CT results, they told me that he had already been let go.  In the  meantime, I had not reviewed the CT results with him in person and could not  arrange a followup for this.  As well, I had not been able to supply  prescriptions.   Therefore, I contacted our office and had them schedule an appointment for  him to see a GI physician to follow up possible duodenal mass on CT scan.  As well, I called our nurses in the office and had them call in his  prescriptions to the pharmacy.   CONSULTATIONS:  None.   PROBLEM:  1. PV angiogram on December 11, 2003, with findings as above.  2. Radiology.  He underwent follow-up CT scan on December 12, 2003.  Again     see above.  I did go over the results with the radiologist, however, the    patient had been let go home prior to me being able to review the     findings and follow-up plans with  him.  Per the radiologist, I do not     have the full report, but per his verbal report to me, the CT showed the     following:  3.3 x 2.7 cm mass-like finding.  It was undetermined whether     this was mass or whether it was duodenal protrusion.  He recommended     follow-up upper GI study or endoscopy to determine whether this is mass     or duodenal mucosal abnormality.   As well, the previous 8 mm right lower lobe nodule has not gotten smaller  and is now about 6 mm.   Additionally, the previous left-based densities were now some resolved and  some smaller, but has overall improved.  There is also underlying COPD.   LABORATORY DATA:  Sodium 130, potassium 3.8, chloride 93, BUN 10, creatinine  1.5, PT 12.8, INR 0.9.  White count 5.2, hemoglobin 14.9, hematocrit 43.6,  and platelets 184.   DISCHARGE MEDICATIONS:  1. Plavix 75 mg once a day.  2. Protonix 40 mg twice a day for two weeks, then once a day.  3. Diovan 80 mg once a day.  4. Zocor 40 mg once a day.  5. Pletal 50 mg twice a day.  6. Toprol XL 200 mg once a day.  7. Norvasc 5 mg once a day.  8. Clonidine 0.1 mg twice a day.   ACTIVITY:  No strenuous activity, lifting greater than 5 pounds, driving for  three days.   DIET:  Low cholesterol and low fat diet.   WOUND CARE:  May gently wash the groin site with warm water and soap.   FOLLOWUP:  Call [phone] if any bleeding at the groin site.  He is scheduled  for lower extremity Dopplers and renal Dopplers on Tuesday, February 1, at  11 a.m.  He has been scheduled to see Dr. Alanda Amass on February 23, at 2:30  p.m.   The office is going to make him an appointment to see a gastroenterologist  to follow up his CT findings.  Again, the CT scan revealed a 3.3 x 2.0 cm  mass-like finding, however, he could not determine whether this was a mass  or whether it was duodenal protrusion. Radiologist recommends followup upper  GI or endoscopy to see if this is mass or mucosal  abnormality.      Mary B. Easley, P.A.-C.                   Richard A. Alanda Amass, M.D.    MBE/MEDQ  D:  12/12/2003  T:  12/13/2003  Job:  [id number]   cc:   Rosalyn Gess. Norins, M.D. [facility]

## 2011-04-10 NOTE — H&P (Signed)
NAMESTEPHAUN, MILLION NO.:  [id number]   MEDICAL RECORD NO.:  [id number]          PATIENT TYPE:  EMS   LOCATION:  ED                           FACILITY:  [facility]   PHYSICIAN:  Valetta Mole. Swords, MD    DATE OF BIRTH:  11/13/1920   DATE OF ADMISSION:  03/09/2007  DATE OF DISCHARGE:                              HISTORY & PHYSICAL   CHIEF COMPLAINT:  Shortness of breath and cough.   The patient complains of a 2-3 week history of progressive cough that  has gotten significantly worse over the past 2-3 days up the point where  if he does any exertion he coughs.  He really does not complain of a  whole lot of shortness of breath.  He denies fevers or chills but states  that he has been cold at times.  He denies any sweats.  He denies any  recent ill contacts. He denies any recent travel history.   PAST MEDICAL HISTORY:  1. Significant for chronic renal insufficiency.  2. Alcohol abuse.  3. Peptic ulcer disease.  4. Peripheral vascular disease.  5. Laryngeal cancer.  6. Gastric outlet obstruction which led to truncal vagotomy with a      gastric jejunostomy and a cholecystectomy.  7. A history of anemia.   MEDICATIONS:  1. Toprol XL 200 mg p.o. daily.  2. Klor-Con 20 mEq p.o. daily.  3. Allopurinol 300 mg p.o. daily.  4. Furosemide 40 mg p.o. daily.  5. Aspirin 81 mg p.o. daily.   FAMILY HISTORY:  Father deceased with heart disease in his 37's.  Mother  deceased in her 44's.   SOCIAL HISTORY:  The patient lives alone. Family members do check on  him.   ALLERGIES:  No known drug allergies.   REVIEW OF SYSTEMS:  He denies any chest pain, PND, orthopnea. He denies  any abdominal pain, change in appetite, rashes. Bowel movements nd  urinary habits are normal.  He denies any other complaints in a complete  review of systems.   PHYSICAL EXAMINATION:  VITAL SIGNS:  Temperature 98, pulse 88,  respirations 18, blood pressure 110/60.  GENERAL:  He appears as an  elderly African-American male in no acute  distress.  HEENT EXAM:  Atraumatic, normocephalic, extraocular movements are  intact.  NECK:  Supple without lymphadenopathy, thyromegaly, jugular venous  distension or carotid bruits.  CHEST:  Significant for rhonchi at the left base with decreased breath  sounds at the left base and dullness to percussion at the left base.  CARDIAC EXAM:  S1 and S2 are regular without gallops.  ABDOMEN:  Active bowel sounds, soft, nontender, there is no  hepatosplenomegaly, no masses are palpated.  EXTREMITIES:  There is no clubbing, cyanosis or edema.  NEUROLOGIC EXAM:  He is alert and oriented.   Chest x-ray demonstrates left lower lobe pneumonia.   ASSESSMENT AND PLAN:  1. Pneumonia. Will treat with Rocephin and azithromycin, oxygen as      needed. The patient understands the seriousness of the disease.   Other medical problems seem stable.   1.  Hyponatremia likely diuretic induced, will follow that.   Hyponatremia is likely exacerbated by pneumonia.   1. Code status discussed with the patient. He does not want any      cardioversion or ventilation, intubation. Full DNR is filled out.      Bruce Rexene Edison Swords, MD  Electronically Signed     BHS/MEDQ  D:  03/09/2007  T:  03/09/2007  Job:  [id number]   cc:   Rosalyn Gess. Norins, MD  [address]  [place]  [place] [address]

## 2011-04-10 NOTE — H&P (Signed)
Wesley [name], Wesley [name]                 ACCOUNT NO.:  [id number]   MEDICAL RECORD NO.:  [id number]          PATIENT TYPE:  INP   LOCATION:  4714                         FACILITY:  [facility]   PHYSICIAN:  Valetta Mole. [name], M.D. [facility] OF BIRTH:  1920-02-19   DATE OF ADMISSION:  03/04/2006  DATE OF DISCHARGE:                                HISTORY & PHYSICAL   CHIEF COMPLAINT:  Nausea and vomiting.   HISTORY OF PRESENT ILLNESS:  Wesley [name] is an 77 year old male with multiple  medical problems.  He developed an episode of nausea and vomiting associated  with chest pain last night.  He also had some associated shortness of  breath.  He states that symptoms lasted about 15-20 minutes and resolved  spontaneously, and he has been pain free since.  He is not having recurrent  nausea or vomiting.  He carries a diagnosis of peripheral vascular disease.  Those details are not known.  He states he has never had a heart attack, but  he says that he had a stress test by Dr. Alanda Amass last week, results  unknown, but he says it was abnormal, and he has followup with Dr. Alanda Amass  this Monday.  He denies any exertional chest discomfort.  He denies any  other modifying factors.  He does have risk factors for heart disease,  including his age and hypertension.   PAST MEDICAL HISTORY:  1.  Chronic renal insufficiency.  2.  Alcoholism.  3.  Peptic ulcer disease.  4.  Peripheral vascular disease.  5.  Laryngeal cancer.  6.  He had an EGD in 2006 prior to significant surgery at that time.  EGD      demonstrated gastritis and duodenitis.  During that hospitalization, he      was found to have a gastric outlet obstruction and underwent a truncal      vagotomy, a gastrojejunostomy, with a cholecystectomy.  7.  He also carries a diagnosis of iron deficiency anemia.   MEDICATIONS:  1.  Toprol XL 200 mg p.o. daily.  2.  Aspirin 81 mg p.o. daily.  3.  Lisinopril 40 mg p.o. daily.  4.  He states he takes a fluid  pill, but did not bring that to the hospital      with him.   ALLERGIES:  No known drug allergies.   SOCIAL HISTORY:  He lives alone.  He has family who looks after him.  He  does smoke 1-2 packs a day.  Drinks alcohol 1-4 drinks a day.   REVIEW OF SYSTEMS:  He denies any ongoing chest pain, shortness of breath.  He does have chronic lower extremity edema.  He states that is not  significantly worse than usual.  He denies any abdominal pain.  He denies  any rashes.  He denies any neurologic deficits.  He denies any other  complaints in the review of systems, other than those listed above in the  HPI.   PHYSICAL EXAMINATION:  VITAL SIGNS:  Blood pressure 110/60; heart rate 80;  respirations 16.  GENERAL:  This is an elderly  African-American male in no acute distress.  HEENT:  Atraumatic, normocephalic.  Extraocular muscles are intact.  NECK:  Supple, without lymphadenopathy, thyromegaly, jugular venous  distention, or carotid bruits.  CHEST:  Clear to auscultation, without increased work of breathing.  Breath  sounds do seem somewhat diminished bilaterally.  On percussion, air fields  seem hyperinflated.  CARDIAC:  S1 and S2 are regular.  He does have a 1-2/6 holosystolic murmur.  PMI seems slightly laterally displaced.  ABDOMEN:  Active bowel sounds.  Soft, nontender.  There is no  hepatosplenomegaly.  No masses are palpated.  EXTREMITIES:  There is no clubbing or cyanosis.  He does have 2-3+ pitting  edema of both legs to the mid-calf.  NEUROLOGIC:  He is alert and oriented, without any motor or sensory  deficits.   Abnormal laboratories include creatinine 1.7, hemoglobin 12.3, and amylase  163.   EKG demonstrates normal sinus rhythm with a left bundle branch block.   Point of care enzymes are negative.   ASSESSMENT AND PLAN:  1.  Chest pain.  Chest discomfort really does not sound cardiac, but it      certainly needs to be evaluated that way.  He does state he had an       abnormal Cardiolite.  Those details are not known.  He needs to be      admitted to rule out for an MI.  I will ask Dr. Alanda Amass to see him in      the hospital.  Will treat with Lovenox.  Continue his aspirin and beta      blocker.  Also, continue his ACE inhibitor, as his blood pressure seems      well controlled, and the renal insufficiency seems chronic and not      exacerbated by the ACE inhibitor.  2.  Lower extremity edema.  Unclear etiology.  I suspect this has been      evaluated as an outpatient.  I suspect the patient has had an      echocardiogram.  I will not order that until Dr. Alanda Amass provides      consultation.  I will add Lasix 40 mg p.o. daily.  3.  Chronic renal insufficiency.  Seems stable.  4.  Anemia.  Note previous laboratories have documented iron deficiency.  He      does have an elevated MCV.  I suspect this has all been evaluated as an      outpatient and will not pursue in the hospital.  No history of peptic      ulcer disease.  I doubt his nausea and vomiting are a recurrence of that      problem.  5.  Laryngeal cancer.  No evidence of recurrence.  Seems stable.      Wesley [name], M.D. [facility]  Electronically Signed     BHS/MEDQ  D:  03/04/2006  T:  03/04/2006  Job:  [id number]   cc:   Rosalyn Gess. Norins, M.D. [facility]  [address]  [place]  [place] [address]

## 2011-04-10 NOTE — Discharge Summary (Signed)
Wesley [name], Wesley [name]                 ACCOUNT NO.:  [id number]   MEDICAL RECORD NO.:  [id number]          PATIENT TYPE:  INP   LOCATION:  4714                         FACILITY:  [facility]   PHYSICIAN:  Georgina Quint. Plotnikov, M.D. Romualdo Bolk OF BIRTH:  12-Jul-1920   DATE OF ADMISSION:  03/04/2006  DATE OF DISCHARGE:  03/05/2006                                 DISCHARGE SUMMARY   DISCHARGE DIAGNOSES:  1.  Atypical chest pain.  2.  Bilateral lower extremity edema, rule out congestive heart failure.   HISTORY OF PRESENT ILLNESS:  The patient is an 77 year old male who was  admitted on March 04, 2006, with a chief complaint of nausea and vomiting.  This nausea and vomiting was also associated with chest pain on the evening  prior to admission.  He stated that symptoms lasted approximately 15-20  minutes and resolved spontaneously.  The patient reportedly had a stress  test performed by Dr. Alanda Amass last week, which was reportedly abnormal per  patient.  The patient is admitted for further evaluation.   PAST MEDICAL HISTORY:  1.  Chronic renal insufficiency.  2.  Alcoholism.  3.  Peptic ulcer disease.  4.  Peripheral vascular disease.  5.  Laryngeal cancer.  6.  Status post EGD 2006 prior to significant surgery at that time, which      demonstrated gastritis and duodenitis.  7.  Iron-deficiency anemia.   COURSE OF HOSPITALIZATION:  Problem 1.  ATYPICAL CHEST PAIN:  The patient was admitted and placed on  telemetry.  He maintained sinus rhythm in the 80s.  Serial cardiac enzymes  were performed, which were negative.  The patient's chest pain appears to  have more of a GI quality; however, as the patient had a recent Cardiolite  performed last week by Dr. Alanda Amass which was reportedly abnormal, we will  have cardiology evaluate him prior to discharge.   Problem 2.  BILATERAL LOWER EXTREMITY EDEMA, RULE OUT CONGESTIVE HEART  FAILURE:  The patient takes Lasix at home.  Lasix is continued here  during  hospitalization.  The patient does report some right lower extremity calf  tenderness and as a result, we will check a lower extremity Doppler prior to  discharge to rule out the possibility of DVT.   The patient was noted to have nausea and vomiting on admission.  Amylase is  mildly elevated with a value of 163.  The patient is status post  cholecystectomy.  Will check abdominal ultrasound to rule out retained stone  prior to discharge.   MEDICATIONS AT DISCHARGE:  1.  Toprol XL 200 mg p.o. daily.  2.  Aspirin 81 mg p.o. daily.  3.  Lisinopril 40 mg p.o. daily.  4.  Lasix.  The patient is to resume the same dose as prior to admission.  5.  Potassium chloride 40 mEq p.o. daily.   LABORATORY DATA AT DISCHARGE:  Serial cardiac enzymes negative x4.  Lipase  25, amylase 163.  Hemoglobin 12.3, hematocrit 37.2.   FOLLOW-UP:  The patient is scheduled to follow up with Casimiro Needle E. Norins,  M.D., on  Thursday, April 26, at 2:15 p.m.  He is instructed to call Dr.  Debby Bud should he develop nausea or vomiting and to go to the ER should he  develop worsening chest pain.      Melissa S. Peggyann Juba, NP    ______________________________  Georgina Quint Plotnikov, M.D. [facility]    MSO/MEDQ  D:  03/05/2006  T:  03/05/2006  Job:  [id number]

## 2011-04-10 NOTE — Discharge Summary (Signed)
NAMETAD, FANCHER                 ACCOUNT NO.:  [id number]   MEDICAL RECORD NO.:  [id number]          PATIENT TYPE:  INP   LOCATION:  1331                         [facility]ACILITY[facility]   PHYSICIAN:  Rosalyn Gess. Norins, MD  DATE OF BIRTH:  Aug 01, 1920   DATE OF ADMISSION:  03/09/2007  DATE OF DISCHARGE:  03/13/2007                               DISCHARGE SUMMARY   ADMITTING DIAGNOSES:  1. Left lower lobe pneumonia.  2. Hypernatremia.   DISCHARGE DIAGNOSIS:  1. Left lower lobe pneumonia.  2. Hypernatremia.   CONSULTANTS:  None.   PROCEDURES:  1. Chest x-ray was performed April 16 at the time of admission which      revealed new left basilar air space disease.  2. Followup chest x-ray April 18 which revealed lower lobe pneumonia      on the left with no significant change.   HOSPITAL COURSE:  #1.  The patient was admitted to a regular bed.  He was started on  Rocephin and azithromycin.  On this regimen, he had a very good  response.  His O2 saturations improved dramatically.  The patient had  normal white blood count.  It was felt that, with significant  improvement in his condition, with white count being normal, with no  increased work of breathing, with O2 saturations being 97% on room air,  he was stable and ready for discharge home.   #2.  HYPONATREMIA.  This resolved with IV fluids and was stable.   DISCHARGE PHYSICAL EXAMINATION:  VITAL SIGNS:  Temperature 98.2, blood  pressure 111/61, pulse 60, respirations 18, O2 saturation  97% on room  air.  GENERAL APPEARANCE:  This is a well-nourished, well-developed African-  American gentleman looking younger than stated chronologic age.  CHEST:  The patient is moving air well with no rales, wheezes or  rhonchi.  CARDIOVASCULAR:  2+ radial pulse.  He had a regular rate and rhythm  without murmurs, rubs or gallops.   DISCHARGE MEDICATIONS:  The patient will resume:  1. Toprol XL 200 mg daily.  2. Potassium 20 mEq daily.  3.  Allopurinol 300 mg daily.  4. Furosemide 40 mg daily.  5. Aspirin 81 mg daily.  6. Will add Ceftin 250 mg b.i.d. for 5 days.   DISPOSITION:  The patient is discharged home.  He is to call the office  for followup appointment in 7-10 days.  The patient's condition at time  of discharge dictation is stable and improved.      Rosalyn Gess Norins, MD  Electronically Signed     MEN/MEDQ  D:  03/13/2007  T:  03/13/2007  Job:  [id number]

## 2011-04-10 NOTE — Discharge Summary (Signed)
NAMEFRANSICO, [name] NO.:  [id number]   MEDICAL RECORD NO.:  [id number]          PATIENT TYPE:  INP   LOCATION:  5504                         FACILITY:  [facility]   PHYSICIAN:  Rosalyn Gess. Norins, M.D. LHCDATE OF BIRTH:  11-Sep-1920   DATE OF ADMISSION:  06/15/2005  DATE OF DISCHARGE:  07/03/2005                                 DISCHARGE SUMMARY   ADMISSION DIAGNOSES:  1.  Dysphagia with anorexia and weight loss.  2.  Peripheral vascular disease.  3.  Hypertension.  4.  History of laryngeal carcinoma.  5.  Acute renal insufficiency secondary to dehydration.   DISCHARGE DIAGNOSES:  1.  Gastric outlet obstruction secondary to peptic ulcer disease.  2.  Cholelithiasis.  3.  Peripheral vascular disease.  4.  Hypertension.  5.  History of laryngeal carcinoma.  6.  Acute renal insufficiency, resolved.   CONSULTATIONS:  1.  Thomas A. Cornett, M.D., General surgery.  2.  Judie Petit T. Russella Dar, M.D., Gastroenterology.  3.  Richard A. Alanda Amass, M.D., [facility] and Vascular.   PROCEDURES:  1.  Abdominal series June 15, 2005 with mild diffuse ileus.  2.  CT scan abdomen with contrast June 17, 2005 with gallstones, no acute      findings in the abdomen.  No acute findings in the pelvis.  3.  Upper endoscopy which revealed gastritis, duodenitis without hemorrhage,      duodenal stricture and hiatal hernia.  4.  Truncal vagotomy, gastrojejunostomy, open cholecystectomy with      intraoperative cholangiogram and placement of a feeding stem gastrostomy      tube.   HISTORY OF PRESENT ILLNESS:  The patient is an 77 year old male who  presented to the emergency department complaining of diarrhea and dysphagia  X2 weeks.  He had been having a hard time getting food down and felt very  full after swallowing.  The patient had significant weight loss over the  past several weeks.  Because of these symptoms he was admitted to the  hospital.   PAST MEDICAL HISTORY:   Significant for glaucoma, peripheral vascular  disease, left bundle branch block, chronic obstructive pulmonary disease,  hypertension, history of laryngeal carcinoma, history of tobacco abuse,  history of pulmonary nodule, history of hiatal hernia, history of  diverticulosis.   SOCIAL HISTORY:  The patient lives alone but does have relatives nearby.  He  continues to smoke 1-1/2 packs of cigarettes per day despite his history of  laryngeal cancer.  He averages 3 to 4 ounces per day of alcohol.   MEDICATIONS AT ADMISSION:  1.  Lisinopril 40 mg daily.  2.  Metoprolol 100 mg daily.  3.  Diovan 80 mg daily.  4.  Norvasc 5 mg daily.  5.  Acular daily.  6.  Xalatan eye drops daily.  7.  Baby aspirin daily.   HOSPITAL COURSE:  PROBLEM #1:  DYSPHAGIA:  The patient was seen by  gastroenterology in consultation.  The patient's CT scan suggested a mass in  the abdomen.  X-rays were unrevealing.  CT scan was unrevealing.  The  patient  did come to upper endoscopy as noted.  It was felt the patient was a  candidate for surgical intervention for relief of his duodenal stricture and  gastric outlet obstruction.  The patient was seen in consultation by general  surgery on June 18, 2005.  The patient did have to have cardiovascular  clearance before surgery but was cleared and patient came to operation June 19, 2005.  Surgery was uneventful.  The patient had a steady but slow  recovery with recovery of bowel sounds and ability to take p.o.'s. By the  day of discharge the general surgical service felt the patient was stable  and ready for discharge home.  Gastrostomy tube had been discontinued.  He  had been taking p.o. foods.   PROBLEM #2:  PERIPHERAL VASCULAR DISEASE:  The patient remained stable  during his hospitalization.   PROBLEM #3:  HYPERTENSION:  The patient's blood pressure was carefully  monitored during his hospital stay and he did well.  Of note, he was seen by  [facility]  and Vascular in consultation.  The patient was felt to be  stable from a cardiovascular perspective and he was cleared.  He was  continued on his home medications.   PROBLEM #4: HISTORY OF LARYNGEAL CARCINOMA:  This was not a problem during  the patient's stay.  He did have a normal esophagus at upper endoscopy.  There was no evidence of recurrent laryngeal cancer at that time.  The  patient is encouraged to discontinue smoking.   PROBLEM #5:  ACUTE RENAL INSUFFICIENCY:  The patient's initial creatinine  was 2.8 at admission.  By the time of discharge, with restoration of his  ability to take p.o.'s satisfactorily his creatinine came down to normal and  the last value prior to discharge from June 30, 2005, revealed a creatinine  of 1.1.   PROBLEM #6: PULMONARY:  The patient does continue to smoke as noted.  He did  have several portal chest x-ray's none of which revealed any specific  suspicious issues except for question of a small nodule in the right lung  field.  This will need to be followed up with a CT scan in the next two to  three months.   PROBLEM #7:  INFECTIOUS DISEASE:  The patient did develop a bibasilar  infiltrate, low grade fever and productive cough.  He was started on  intravenous antibiotics and converted to oral antibiotics the day prior to  discharge.  He will complete this as an outpatient.   DISCHARGE PHYSICAL EXAMINATION:  GENERAL APPEARANCE:  The patient is awake,  alert, is sitting up in a chair.  He feels confident about being discharged.  He is not complaining of any significant pain.  This is a lanky, African-  American gentleman who is in no acute distress.  VITAL SIGNS:  Temperature 99.4, blood pressure 128/81, pulse was 87,  respirations 18, oxygen saturation 94% on room air.  HEENT:  Examination is unremarkable.  CHEST:  Patient is moving air well.  There are no rales and no wheezes.  No increased work of breathing.  CARDIOVASCULAR:  Patient had 2+  radial pulses, precordium was quiet.  He had  a regular rate and rhythm without murmurs, rubs or gallops.  ABDOMEN:  Patient has midline surgical scar.  Bandages cover a majority of  it but there was a visible staple line which is clean.  He does have a drain  in place.  EXTREMITIES:  Without cyanosis, clubbing  or edema.   FINAL CLINICAL DATA:  Final x-ray June 30, 2005 with bibasilar infiltrates.   FINAL LABORATORY DATA:  Final blood cultures from June 30, 2005 were  negative.  Final CBC from June 30, 2005 with white blood cell count 7,900,  hemoglobin 10.6 grams, MCV 98.2.  Final chemistries June 30, 2005 with  sodium 136, potassium 4.6, chloride 104, cO2 21, glucose 96, BUN 11,  creatinine 1.1, calcium 8.3.  Lasts liver function tests from June 27, 2005  with AST 29, ALT 31, ALP 66, total bilirubin 1.0.  On June 17, 2005 the  calcium, 19.9 was low at 23.9.  Patient had complement C4 that was 18.  Pathology report from surgery was unremarkable.  Gallbladder with chronic  cholecystitis and cholelithiasis.  Non-invasive stress test was performed  June 20, 2005 and read out as unremarkable with no significant ischemia  observed.   DISCHARGE MEDICATIONS:  Patient is to resume home medications including:  1.  Lisinopril 40 mg daily.  2.  Metoprolol 100 mg daily.  3.  Diovan 80 mg daily.  4.  Norvasc 5 mg daily.  5.  Patient will continue his Acular and Xalatan eye drops.  6.  Patient will complete a course of Avelox 400 mg p.o. daily X7.   DISPOSITION:  Patient is discharged home. He will have home health nursing  care for dressing changes.  He will see general surgery as instructed.  He  will see Dr. Debby Bud for general medicine in 14 days, sooner as needed.   CONDITION ON DISCHARGE:  The patient's condition at the time of discharge  dictation is stable and improved.        MEN/MEDQ  D:  07/03/2005  T:  07/03/2005  Job:  [id number]   cc:   Thomas A. Cornett, M.D.  [address] [place] [address]  [place] [address]   Gerlene Burdock A. Alanda Amass, M.D.  [phone] [address], [address]  [place]  [place] [address]  Fax: [phone]

## 2011-04-10 NOTE — Cardiovascular Report (Signed)
Wesley [name], Wesley [name]                             ACCOUNT NO.:  [id number]   MEDICAL RECORD NO.:  [id number]                   PATIENT TYPE:  OIB   LOCATION:  6533                                 FACILITY:  [facility]   PHYSICIAN:  Richard A. Alanda Amass, M.D.          DATE OF BIRTH:  1920-05-16   DATE OF PROCEDURE:  12/11/2003  DATE OF DISCHARGE:                              CARDIAC CATHETERIZATION   PROCEDURE:  Abdominal aorta angiogram, mid-stream PA projection, bilateral  iliac angiography, PA and oblique projections, bilateral selective renal  angiography by hand injection, bilateral lower extremity runoff using bolus-  chase technique with cineangiography, PTA and tandem stenting, high-grade  distal and mid-distal SFA stenosis, nitinol self-expanding stents,  transstenotic gradient measurement right external iliac artery, RSFA tandem  stenting done by contralateral cross-over technique, proximal LSFA, PTA, and  nitinol self-expanding stent by cross-over technique from RCFA, left common  iliac stent by retrograde ipsilateral technique, weight-adjusted heparin,  p.o. aspirin 10 grains, p.o. Plavix 300 mg.   PROCEDURE DESCRIPTION:  The patient was brought to the sixth floor PV Lab in  the postabsorptive state after 5 mg of Valium p.o. premedication.  He was  given intermittent Nubain in the laboratory for sedation, a total of 4 mg  IV.  He was given Protonix 40 IV because of GERD, 10 grains of aspirin, and  300 mg of Plavix p.o.  During the procedure he was given 5000 units of  heparin in divided doses and labetalol 20 mg IV for systemic hypertension.   The patient was brought to the sixth floor PV Lab in the postabsorptive  state, 5 mg of Valium p.o. premedication was administered.  The right and  left groins were prepped and draped in the usual manner.  There was 1%  Xylocaine used for local anesthesia, and the LCFA was entered with a single  anterior puncture using an 18 thin-walled  needle.  A 6-French short sidearm  sheath was inserted without difficulty.  A Wholey wire was used throughout  the procedure to negotiate the iliac system.  A 5-French pigtail catheter  was positioned above the level of the renal arteries, and abdominal aortic  angiography was done in the midstream PA projection at 20 mL, 20 mL/sec.  A  second injection was performed above the iliac bifurcation at the same  settings.  Selective bilateral renal angiography was done by hand injections  with a 5-French short right coronary catheter.  The left lower extremity  runoff was done after a selective left SFA profunda injection was done in  the right projection.  LLE runoff was then done at 45 mL at 10 mL/sec with  visualization to the left foot using manual bolus-chase technique with  cineangiography.  This was used because step-table digital subtraction  angiography was not functioning during this procedure.  Good visualization  was seen with this technique.  The right common iliac was then  accessed with  an IMA catheter.  A Wholey wire was initially used, then a Glidewire.  An  internal catheter was positioned in the right common iliac, and the wire was  exchanged back for a Wholey wire.  Right SFA profunda injection was done by  hand.  Right lower extremity runoff was done with cineangiography at 40 mL,  10 mL/sec, with visualization to the right foot.  There was a transstenotic  gradient of 60 mmHg across the LCIA.   Later in the procedure there was only a 10- to 20-mmHg gradient across the  REIA.   Abdominal angiogram demonstrated mild stenosis of the left renal artery and  a 60-70% mildly segmental ostial proximal stenosis of the right renal  artery.  The proximal SMA and profunda were intact.  The IMA was only  faintly visualized.  There is moderately severe diffuse atherosclerotic  disease of the abdominal aorta in a lumpy, bumpy appearance, and there is  mild aneurysmal dilatation  proximal to the iliac bifurcation.  The iliac  bifurcation showed no significant stenosis bilaterally.  There was 95%  mildly segmental stenosis of the distal LCIA on selective iliac oblique  injections and standard abdominal angiography.  On oblique projections there  was approximately 60% smooth narrowing of the REIA with a 10- to 20-mmHg  gradient across this.  The hypogastrics were intact bilaterally, but there  was 80-90% proximal hypogastric stenosis bilaterally.   Selective right renal angiogram revealed 70% proximal stenosis or possibly  higher of the right renal artery.  The left renal artery had 40% narrowing.  Both renal arteries were single.   The left profunda was intact.  The left SFA had 90-95% mildly segmental and  focal stenosis in the proximal third.  The mid left SFA had diffuse disease,  and there was an area of 80-85% in the junction of the midportion.  Throughout Hunter's canal there was lumpy, bumpy appearance and  approximately 50-60% narrowing.  The proximal left popliteal had 50-60%  narrowing, good residual lumen.  The left anterior tibial was totally  occluded, and there was 2-vessel runoff to the left foot by a patent LPT and  left peroneal vessel.   There was mild aneurysmal dilatation of the distal third of the RCIA.  The  REIA, as mentioned, had fairly smooth 60% narrowing with good residual lumen  in the oblique projection.  There was 40% narrowing of the proximal REIA.   There was eccentric mildly calcific, somewhat ulcerative, lesion of the  distal RCFA just before the profunda origin.  The profunda had 40% narrowing  in its proximal portion with good flow.  The RCFA lesion ended just before  the profunda and was eccentric.  The proximal right SFA had lumpy, bumpy  appearance and 40-50% narrowing.   There was segmental 80% narrowing calcific of the left mid to distal SFA  throughout Hunter's canal.  There was an intercurrent fairly normal  segment and then another segmental 80% stenosis of the distal SFA just proximal to  the popliteal artery.  The popliteal had irregularities but no significant  stenosis.   The RAT and right peroneal were totally occluded.  There was essentially  single-vessel runoff via an RPT to the right foot.   Because of this patient's bilateral claudication and slowly healing ulcer of  his right fifth toe, it was elected to proceed with intervention in this  setting.  Arterial pressures were monitored throughout the procedure and  ranged from 190-210  mmHg.  After IV labetalol 10 mg x2 was given, blood  pressure came down to the 160 range.  He maintained sinus rhythm with left  bundle branch block throughout the procedure.   The patient was given a total of 5000 units of heparin in divided doses, 10  grains of aspirin, 300 mg of Plavix during the procedure.  He was given 4 mg  of Nubain for sedation and IV Protonix 40 mg for history of GERD with past  aspirin therapy.   The right iliac was accessed via contralateral technique using an IMA  catheter, internal catheter exchange with a Glidewire and a Wholey wire.  A  6-French Terumo flexible sheath was then used to cross over from the left to  the right iliac artery.  A Wholey wire was used to cross the tandem, mid,  and distal SFA lesions.  The distal SFA was then primarily stented with a 7-  mm x 60-mm self-expanding nitinol SMART stent.  This was done under  fluoroscopic control using standard sheath-withdrawal technique.   A second tandem stent in the mid-distal right SFA was positioned proximal to  the first one and was a 6-mm x 60- nitinol self-expanding Cordis SMART  stent.  This was likewise deployed under fluoroscopic control.  Both stents  were then dilated with a 6-mm x 4-cm Cordis Powerflex balloon at 10-30, 12-  30, 12-30, 10-30, and 14-30.  There was good expansion of the stents, and  the final injection was done with digital  subtraction angiography by hand  injection demonstrating less than 10% narrowing throughout the tandem  stented areas and good intercurrent nonstented area throughout the mid-  distal right SFA.  Following this, the crossover sheath was removed and  replaced with a 6-French Terumo sheath.  Left iliac angiography was then  done in the oblique projection by hand injection demonstrating high-grade  distal LCIA stenosis.  The CRFA was entered with a single anterior puncture  using an 18 thin-walled needle, and a 6-French sheath was inserted.  An IMA  catheter was used to access the left common iliac, and then using exchange  technique with a Glidewire internal catheter and a Wholey wire the catheter  and sheath were exchanged for the same 6-French flexible Terumo sheath.  This was positioned in the left external iliac.  There was a 60 mmHg  gradient across the left common iliac.  We then used a Wholey wire to cross  the high-grade proximal LSFA stenosis.  This was then stented with a 7-mm x 4-cm Guidant absolute stent, nitinol self-expanding, which was deployed  under fluoroscopic control.  It was postdilated with a 6-mm x 4-cm balloon  at 6 atm for 30 seconds with good stent expansion.  DSA was then done  showing stenosis reduction of 90-95% to 0% with good angiographic result.  The stent was beyond the profunda origin which was well intact with good  flow.  It should be noted that the LCFA sheath was well above the profunda.   We then removed the crossover right-to-left sheath and pulled that back to  the RCFA.  Through the short sheath we primarily stented the LCIA with a 10-  mm x 4-cm self-expanding Cordis SMART stent which was positioned  fluoroscopically and deployed in standard fashion.  It was postdilated with  an 8-mm x 3-cm balloon dilated at 12 atm-45 seconds.  Final injection by  hand demonstrated excellent angiographic result with stenosis reduction of  95% to less  than 10%.   Both guidewires were removed.  Sidearm sheath was  flushed, and the patient was transferred to the catheterization laboratory  holding area for ACT measurement and sheath removal.  He tolerated the  procedure well.   The patient has residual bilateral renal artery stenosis, right greater than  left.  Moderate stenosis of the REIA which had only a mild gradient.  We  will go ahead and plan to do outpatient lower extremity Dopplers and duplex  scanning for assessment of his current bilateral SFA and left common iliac  stenting along with renal artery Dopplers and duplex study.  He is a  candidate for continued medical therapy, discontinuation of smoking and  ETOH, and cholesterol-lowering therapy.  He has significant systemic  hypertension, and his renal artery stenosis will be reevaluated, and he may  be a candidate for renal artery PTA and stenting if necessary.   He has residual stenosis in the RCFA of at least 60-70%, and we will rescan  this area.  If he needs intervention in this area, he is a candidate for [employer] atherectomy.   Please refer to H&P for complete history.  The patient also has a nodule  noted on x-ray and CT scan of August 10, 2003, which appeared unchanged  from prior CT of April 2004, but follow-up CT was suggested of this left  lower lobe subpleural density.   This 77 year old divorced father of 1 with 2 grandchildren is an African-  American, still smokes 1/2 to 1 pack a day for over 65 years.  He is retired  from [employer], and he had prior CA of the larynx and apparently had  radiation therapy under the care of Dr. Dan Humphreys without known recurrence.  He has systemic hypertension, probable hyperlipidemia, no clinical history  of angina, and he was referred to [place] for severe bilateral claudication with  nonhealing ulcer of his right fifth toe after a podiatry evaluation by Dr.  Girtha Rm.  He had left bundle branch block, calcific ABIs with  high-grade left  iliac, left SFA, right SFA stenoses.  He has 2-vessel runoff on the left, 1- vessel runoff on the right, and has had successful complex intervention as  outlined above.   CATHETERIZATION DIAGNOSES:  1. Peripheral arterial disease (PAD), bilateral claudication, nonhealing     ulcer right fifth toe, abnormal ankle brachial indices and duplex study.  2. Successful percutaneous transluminal angioplasty and stent mid right     superficial femoral artery.  3. Successful percutaneous transluminal angioplasty and stent distal right     superficial femoral artery.  4. Successful percutaneous transluminal angioplasty and stent proximal left     superficial femoral artery.  5. Successful percutaneous transluminal angioplasty and stent left common     iliac artery.  6. Residual right external iliac right common femoral artery disease and     left superficial femoral artery disease as outlined above.  7. Chronic obstructive pulmonary disease, continued cigarette abuse.  8. Laryngeal carcinoma, status post radiation therapy, Dr. Dan Humphreys.  9. Hypertension with bilateral renal artery stenosis, right greater than     left, duplex pending.  10.      Moderately severe infrarenal atherosclerosis with mild aneurysm     distal abdominal aorta.  11.      Right subpleural nodule right lower lobe.  Follow-up CT as outlined     above.  12.      Hyperlipidemia.  13.      Alcohol  abuse.                                               Richard A. Alanda Amass, M.D.    RAW/MEDQ  D:  12/11/2003  T:  12/12/2003  Job:  [id number]   cc:   Rosalyn Gess. Norins, M.D. [facility]   PV Lab, 6th Floor   Nanetta Batty, M.D.  Fax: [phone]   Wynn Banker, M.D.  [address] [facility]  [place]  [place] [address]  Fax: [phone]

## 2011-09-01 LAB — CBC
HCT: 39.2
Hemoglobin: 13.7
MCHC: 34.9
MCV: 100.3 — ABNORMAL HIGH
RBC: 3.9 — ABNORMAL LOW
WBC: 5.4

## 2011-09-01 LAB — DIFFERENTIAL
Eosinophils Absolute: 0 — ABNORMAL LOW
Eosinophils Relative: 1
Lymphs Abs: 1.8
Monocytes Absolute: 0.5
Monocytes Relative: 9
Neutrophils Relative %: 57

## 2011-09-01 LAB — BASIC METABOLIC PANEL
CO2: 19
CO2: 20
Calcium: 8.2 — ABNORMAL LOW
Chloride: 104
GFR calc Af Amer: 34 — ABNORMAL LOW
GFR calc Af Amer: 45 — ABNORMAL LOW
Potassium: 4.8
Potassium: 5.1
Sodium: 133 — ABNORMAL LOW
Sodium: 133 — ABNORMAL LOW

## 2011-09-01 LAB — POCT CARDIAC MARKERS: Myoglobin, poc: 299

## 2011-10-22 ENCOUNTER — Other Ambulatory Visit: Payer: Self-pay | Admitting: Internal Medicine

## 2011-12-02 ENCOUNTER — Ambulatory Visit (INDEPENDENT_AMBULATORY_CARE_PROVIDER_SITE_OTHER): Payer: Medicare Other | Admitting: Internal Medicine

## 2011-12-02 VITALS — BP 110/74 | HR 110 | Temp 97.8°F | Wt 143.0 lb

## 2011-12-02 DIAGNOSIS — M436 Torticollis: Secondary | ICD-10-CM

## 2011-12-02 MED ORDER — HYDROCODONE-ACETAMINOPHEN 10-325 MG PO TABS: 1.0000 | ORAL_TABLET | Freq: Three times a day (TID) | ORAL | Status: AC | PRN

## 2011-12-02 MED ORDER — CYCLOBENZAPRINE HCL 10 MG PO TABS: 10.0000 mg | ORAL_TABLET | Freq: Three times a day (TID) | ORAL | Status: AC | PRN

## 2011-12-02 NOTE — Patient Instructions (Signed)
k pain - muscle spasm with no evidence of pinched nerve. Plan - continue taking meloxicam once a day           Take flexeril 10 mg three times a day - muscle relaxant           Take norco 10/325 every 6 hours for pain.           Call for any numbness, loss of strength, radiation of pain down the arms  Torticollis, Acute You have suddenly (acutely) developed a twisted neck (torticollis). This is usually a self-limited condition. CAUSES   Acute torticollis may be caused by malposition, trauma or infection. Most commonly, acute torticollis is caused by sleeping in an awkward position. Torticollis may also be caused by the flexion, extension or twisting of the neck muscles beyond their normal position. Sometimes, the exact cause may not be known. SYMPTOMS   Usually, there is pain and limited movement of the neck. Your neck may twist to one side. DIAGNOSIS   The diagnosis is often made by physical examination. X-rays, CT scans or MRIs may be done if there is a history of trauma or concern of infection. TREATMENT   For a common, stiff neck that develops during sleep, treatment is focused on relaxing the contracted neck muscle. Medications (including shots) may be used to treat the problem. Most cases resolve in several days. Torticollis usually responds to conservative physical therapy. If left untreated, the shortened and spastic neck muscle can cause deformities in the face and neck. Rarely, surgery is required. HOME CARE INSTRUCTIONS    Use over-the-counter and prescription medications as directed by your caregiver.     Do stretching exercises and massage the neck as directed by your caregiver.     Follow up with physical therapy if needed and as directed by your caregiver.  SEEK IMMEDIATE MEDICAL CARE IF:    You develop difficulty breathing or noisy breathing (stridor).     You drool, develop trouble swallowing or have pain with swallowing.     You develop numbness or weakness in the hands  or feet.     You have changes in speech or vision.     You have problems with urination or bowel movements.     You have difficulty walking.     You have a fever.     You have increased pain.  MAKE SURE YOU:    Understand these instructions.     Will watch your condition.     Will get help right away if you are not doing well or get worse.  Document Released: 11/06/2000 Document Revised: 07/22/2011 Document Reviewed: 12/18/2009 [facility] Patient Information 2012 [place], [place].

## 2011-12-02 NOTE — Progress Notes (Signed)
  Subjective:    Patient ID: Wesley [name], male    DOB: [birth year], 78 y.o.   MRN: [id number]  HPI Wesley [name] presents acutely for neck pain and stiffness that started 4 days ago. He cannot bend his neck. He has had no appetite. He denies focal weakness, paresthesia; no headache, no speech difficulty. He does not recall any precipitating injury.  I have reviewed the patient's medical history in detail and updated the computerized patient record.    Review of Systems System review is negative for any constitutional, cardiac, pulmonary, GI or neuro symptoms or complaints other than as described in the HPI.     Objective:   Physical Exam Filed Vitals:   12/02/11 1105  BP: 110/74  Pulse: 110  Temp: 97.8 F (36.6 C)   Elderly AA man in no distress but definitely uncomfortable. Pulm - normal respirations Cor - RRR MSK- decreased active ROM neck: poor flexion, extension, rotation. Neuro- normal grips, normal DTRs at biceps and radial tendons, normal sensation.        Assessment & Plan:  Torticollis - no evidence of radiculopathy on exam  Plan - vicodin for pain            Meloxicam 15 mg           Flexeril 10 mg tid.

## 2011-12-03 ENCOUNTER — Ambulatory Visit: Payer: Medicare Other | Admitting: Internal Medicine

## 2012-01-25 ENCOUNTER — Ambulatory Visit (INDEPENDENT_AMBULATORY_CARE_PROVIDER_SITE_OTHER): Payer: Medicare Other | Admitting: Internal Medicine

## 2012-01-25 ENCOUNTER — Encounter: Payer: Self-pay | Admitting: Internal Medicine

## 2012-01-25 ENCOUNTER — Other Ambulatory Visit (INDEPENDENT_AMBULATORY_CARE_PROVIDER_SITE_OTHER): Payer: Medicare Other

## 2012-01-25 VITALS — BP 102/62 | HR 90 | Temp 98.0°F | Ht 71.0 in | Wt 145.1 lb

## 2012-01-25 DIAGNOSIS — R972 Elevated prostate specific antigen [PSA]: Secondary | ICD-10-CM

## 2012-01-25 DIAGNOSIS — R350 Frequency of micturition: Secondary | ICD-10-CM

## 2012-01-25 DIAGNOSIS — I10 Essential (primary) hypertension: Secondary | ICD-10-CM

## 2012-01-25 LAB — BASIC METABOLIC PANEL
CO2: 23 mEq/L (ref 19–32)
Calcium: 8.5 mg/dL (ref 8.4–10.5)
Creatinine, Ser: 1.3 mg/dL (ref 0.4–1.5)
GFR: 69.81 mL/min (ref >60.00)
Sodium: 139 mEq/L (ref 135–145)

## 2012-01-25 LAB — URINALYSIS, ROUTINE W REFLEX MICROSCOPIC
Leukocytes, UA: NEGATIVE
Nitrite: NEGATIVE
Specific Gravity, Urine: 1.02 (ref 1.000–1.030)
Urine Glucose: NEGATIVE
Urobilinogen, UA: 0.2 (ref 0.0–1.0)

## 2012-01-25 MED ORDER — TAMSULOSIN HCL 0.4 MG PO CAPS: 0.4000 mg | ORAL_CAPSULE | Freq: Every day | ORAL | Status: DC

## 2012-01-25 NOTE — Assessment & Plan Note (Signed)
Most likely due to incomplete empyting most likely due to BPH, for flomax trial, check glc and UA, but also refer to urology per pt request

## 2012-01-25 NOTE — Assessment & Plan Note (Signed)
Declines further check at this time,  to f/u any worsening symptoms or concerns

## 2012-01-25 NOTE — Progress Notes (Signed)
Subjective:    Patient ID: Wesley [name], male    DOB: 1919/12/28, 78 y.o.   MRN: [id number]  HPI here to c/o more frequent urination for 2 wks, and seems to not be able to urinate compeltely, so that he has to return to urinate even within a few minutes, and on occasion has to sit to urinate completely as well.  Also has some stinging with urination as well, but no pain otherwise such as abd pain, worsening back  Pain (though has ongoing left LBP recurrent, worse to stand, walks with cane, no change overall);  No flank pain, no fever, no blood,  But has some urgency on occasion as well.   No medication changes.  No hx of DM.  Has hx of prostatitis and BPH, and has had antibx for prostate in the past.    Past Medical History  Diagnosis Date  . Osteoarthrosis, unspecified whether generalized or localized, other specified sites   . Acute kidney failure with other specified pathological lesion in kidney   . Prostatitis   . Postsurgical percutaneous transluminal coronary angioplasty status   . Cataract extraction status `  . Peripheral vascular disease, unspecified   . Personal history of malignant neoplasm of larynx   . Elevated prostate specific antigen (PSA)   . Gout, unspecified   . Acute peptic ulcer with hemorrhage   . Unspecified essential hypertension    Past Surgical History  Procedure Date  . Percutaneous translumional coronary angioplastty   . Cataract extraction   . Truncal vagotomy 06/2005  . Gastrojejunosotomy 06/2005  . Cholecystectomy 06/2005    reports that he has been smoking Cigars.  He does not have any smokeless tobacco history on file. His alcohol and drug histories not on file. family history includes Cancer in his brother and Heart disease in his father. No Known Allergies Current Outpatient Prescriptions on File Prior to Visit  Medication Sig Dispense Refill  . allopurinol (ZYLOPRIM) 300 MG tablet TAKE ONE (1) TABLET EACH DAY  30 tablet  6  . amLODipine (NORVASC) 10  MG tablet Take 10 mg by mouth daily.        Marland Kitchen aspirin 81 MG tablet Take 81 mg by mouth daily.        . furosemide (LASIX) 40 MG tablet Take 40 mg by mouth daily.        Marland Kitchen losartan (COZAAR) 50 MG tablet Take 50 mg by mouth daily.        . megestrol (MEGACE) 400 MG/10ML suspension Take 400 mg by mouth daily. Take 1 tsp daily for appetite stimulation       . meloxicam (MOBIC) 15 MG tablet Take 15 mg by mouth daily. For foot pain       . metoprolol (TOPROL-XL) 100 MG 24 hr tablet Take 150 mg by mouth daily.        . potassium chloride (KLOR-CON) 20 MEQ packet Take 20 mEq by mouth daily.         Review of Systems Review of Systems  Constitutional: Negative for diaphoresis and unexpected weight change.  HENT: Negative for drooling and tinnitus.   Eyes: Negative for photophobia and visual disturbance.  Respiratory: Negative for choking and stridor.   Gastrointestinal: Negative for vomiting and blood in stool.  Genitourinary: Negative for hematuria and decreased urine volume.     Objective:   Physical Exam BP 102/62  Pulse 90  Temp(Src) 98 F (36.7 C) (Oral)  Ht 5\' 11"  (1.803 m)  Wt  145 lb 2 oz (65.828 kg)  BMI 20.24 kg/m2  SpO2 95% Physical Exam  VS noted Constitutional: Pt appears well-developed and well-nourished.  HENT: Head: Normocephalic.  Right Ear: External ear normal.  Left Ear: External ear normal.  Eyes: Conjunctivae and EOM are normal. Pupils are equal, round, and reactive to light.  Neck: Normal range of motion. Neck supple.  Cardiovascular: Normal rate and regular rhythm.   Pulmonary/Chest: Effort normal and breath sounds normal.  Abd:  Soft, NT, non-distended, + BS Neurological: Pt is alert. No cranial nerve deficit.  DRE; deferred per pt Skin: Skin is warm. No erythema.  Psychiatric: Pt behavior is normal. Thought content normal.     Assessment & Plan:

## 2012-01-25 NOTE — Assessment & Plan Note (Signed)
stable overall by hx and exam, most recent data reviewed with pt, and pt to continue medical treatment as before  BP Readings from Last 3 Encounters:  01/25/12 102/62  12/02/11 110/74  03/12/11 120/68

## 2012-01-25 NOTE — Patient Instructions (Signed)
Take all new medications as prescribed - the generic flomax for prostate "relaxation" for more complete urination Please go to LAB in the Basement for the blood and/or urine tests to be done today - sugar and urine tests Please call the phone number [phone] (the PhoneTree System) for results of testing in 2-3 days;  When calling, simply dial the number, and when prompted enter the MRN number above (the Medical Record Number) and the # key, then the message should start. You will be contacted regarding the referral for: urology

## 2012-03-18 ENCOUNTER — Other Ambulatory Visit: Payer: Self-pay | Admitting: *Deleted

## 2012-03-18 ENCOUNTER — Other Ambulatory Visit (INDEPENDENT_AMBULATORY_CARE_PROVIDER_SITE_OTHER): Payer: Medicare Other

## 2012-03-18 DIAGNOSIS — I10 Essential (primary) hypertension: Secondary | ICD-10-CM

## 2012-03-18 LAB — COMPREHENSIVE METABOLIC PANEL
Alkaline Phosphatase: 75 U/L (ref 39–117)
BUN: 12 mg/dL (ref 6–23)
CO2: 30 mEq/L (ref 19–32)
GFR: 67.9 mL/min (ref >60.00)
Glucose, Bld: 98 mg/dL (ref 70–99)
Total Bilirubin: 0.9 mg/dL (ref 0.3–1.2)
Total Protein: 7.4 g/dL (ref 6.0–8.3)

## 2012-03-18 NOTE — Progress Notes (Signed)
Pt in office with lab order from Dr. Alanda Amass. Will need to fax labs to him @ [phone].

## 2012-03-20 ENCOUNTER — Encounter: Payer: Self-pay | Admitting: Internal Medicine

## 2012-08-02 ENCOUNTER — Other Ambulatory Visit: Payer: Self-pay | Admitting: Internal Medicine

## 2012-08-02 ENCOUNTER — Other Ambulatory Visit (INDEPENDENT_AMBULATORY_CARE_PROVIDER_SITE_OTHER): Payer: Medicare Other

## 2012-08-02 DIAGNOSIS — R6889 Other general symptoms and signs: Secondary | ICD-10-CM

## 2012-08-02 DIAGNOSIS — R5383 Other fatigue: Secondary | ICD-10-CM

## 2012-08-02 DIAGNOSIS — R5381 Other malaise: Secondary | ICD-10-CM

## 2012-08-02 LAB — COMPREHENSIVE METABOLIC PANEL
ALT: 14 U/L (ref 0–53)
Alkaline Phosphatase: 88 U/L (ref 39–117)
Creatinine, Ser: 1.1 mg/dL (ref 0.4–1.5)
Sodium: 136 mEq/L (ref 135–145)
Total Bilirubin: 1.3 mg/dL — ABNORMAL HIGH (ref 0.3–1.2)
Total Protein: 8 g/dL (ref 6.0–8.3)

## 2012-08-02 LAB — CBC WITH DIFFERENTIAL/PLATELET
Basophils Absolute: 0 10*3/uL (ref 0.0–0.1)
Eosinophils Relative: 0.3 % (ref 0.0–5.0)
HCT: 35.5 % — ABNORMAL LOW (ref 39.0–52.0)
Hemoglobin: 11.9 g/dL — ABNORMAL LOW (ref 13.0–17.0)
Lymphs Abs: 1.5 10*3/uL (ref 0.7–4.0)
MCV: 106.2 fl — ABNORMAL HIGH (ref 78.0–100.0)
Monocytes Absolute: 0.5 10*3/uL (ref 0.1–1.0)
Neutro Abs: 2.4 10*3/uL (ref 1.4–7.7)
Platelets: 214 10*3/uL (ref 150.0–400.0)
RDW: 14.1 % (ref 11.5–14.6)

## 2012-08-02 LAB — TSH: TSH: 1.36 u[IU]/mL (ref 0.35–5.50)

## 2012-08-05 ENCOUNTER — Encounter: Payer: Self-pay | Admitting: Internal Medicine

## 2012-11-01 ENCOUNTER — Ambulatory Visit (INDEPENDENT_AMBULATORY_CARE_PROVIDER_SITE_OTHER): Payer: Medicare Other | Admitting: Internal Medicine

## 2012-11-01 ENCOUNTER — Encounter: Payer: Self-pay | Admitting: Internal Medicine

## 2012-11-01 VITALS — BP 120/68 | HR 73 | Temp 98.0°F | Wt 141.8 lb

## 2012-11-01 DIAGNOSIS — I739 Peripheral vascular disease, unspecified: Secondary | ICD-10-CM

## 2012-11-01 DIAGNOSIS — I872 Venous insufficiency (chronic) (peripheral): Secondary | ICD-10-CM

## 2012-11-01 DIAGNOSIS — I10 Essential (primary) hypertension: Secondary | ICD-10-CM

## 2012-11-01 DIAGNOSIS — I8310 Varicose veins of unspecified lower extremity with inflammation: Secondary | ICD-10-CM

## 2012-11-01 MED ORDER — TRIAMCINOLONE ACETONIDE 0.1 % EX LOTN: TOPICAL_LOTION | Freq: Three times a day (TID) | CUTANEOUS | Status: DC

## 2012-11-01 NOTE — Assessment & Plan Note (Signed)
PAD -s/p B SFA stent 2005 ABI 02/2012 at [facility] normal: L 1.0 and R 1.1 Med mgmt as ongoing - tx of venous stasis as above

## 2012-11-01 NOTE — Progress Notes (Signed)
  Subjective:    Patient ID: Wesley [name], male    DOB: 11-09-1920, 78 y.o.   MRN: [id number]  HPI  complains of BLE leg sores Onset >4 weeks ago -denies hx same associated with weeping Denies claudication, pain or swelling changes Takes meds as rx'd  Past Medical History  Diagnosis Date  . Osteoarthrosis, unspecified whether generalized or localized, other specified sites   . Acute kidney failure with other specified pathological lesion in kidney   . Prostatitis   . Postsurgical percutaneous transluminal coronary angioplasty status   . Cataract extraction status `  . Peripheral vascular disease, unspecified   . Personal history of malignant neoplasm of larynx   . Elevated prostate specific antigen (PSA)   . Gout, unspecified   . Acute peptic ulcer with hemorrhage   . Unspecified essential hypertension     Review of Systems  Constitutional: Negative for fever and fatigue.  Respiratory: Negative for cough and shortness of breath.   Cardiovascular: Negative for chest pain and palpitations.  Neurological: Negative for dizziness, weakness and light-headedness.       Objective:   Physical Exam BP 120/68  Pulse 73  Temp 98 F (36.7 C) (Oral)  Wt 141 lb 12.8 oz (64.32 kg)  SpO2 96% Wt Readings from Last 3 Encounters:  11/01/12 141 lb 12.8 oz (64.32 kg)  01/25/12 145 lb 2 oz (65.828 kg)  12/02/11 143 lb (64.864 kg)   Constitutional:  He appears well-developed and well-nourished. No distress.  Cardiovascular: Normal rate, regular rhythm and normal heart sounds.  No murmur heard. trace BLE edema -DPs palp BLE Pulmonary/Chest: Effort normal and breath sounds normal. No respiratory distress. no wheezes.  Neurological: he is alert and oriented to person, place, and time. No cranial nerve deficit. Coordination normal.  Skin: BLE with chronic venous insuff changes distal shin/calf - mild weeping and superficial ulceration without cellulitis or infection. Remaining skin is warm and  dry.  No erythema  Psychiatric: he has a normal mood and affect. behavior is normal. Judgment and thought content normal.   Lab Results  Component Value Date   WBC 4.4* 08/02/2012   HGB 11.9* 08/02/2012   HCT 35.5* 08/02/2012   PLT 214.0 08/02/2012   GLUCOSE 96 08/02/2012   ALT 14 08/02/2012   AST 37 08/02/2012   NA 136 08/02/2012   K 4.8 08/02/2012   CL 100 08/02/2012   CREATININE 1.1 08/02/2012   BUN 11 08/02/2012   CO2 28 08/02/2012   TSH 1.36 08/02/2012   INR 1.13 02/15/2011       Assessment & Plan:  BLE venous insuff with stasis dermatitis No evidence for cellulitis or ulcer infection  PAD hx B SFA stent 2005 - ABI 02/2012 WNL at [facility] (L 1.0 and R 1.1)  Treat with topical steroid lotion -erx done Refer to wound care - HHRN to assist follow up PCP in 4 weeks, sooner if worse

## 2012-11-01 NOTE — Patient Instructions (Signed)
It was good to see you today. We have reviewed your prior records including labs and tests today Medications reviewed, no changes at this time. Use steroid lotion to your legs 2x/day -Your prescription(s) have been submitted to your pharmacy. Please take as directed and contact our office if you believe you are having problem(s) with the medication(s). we'll make referral to home health urse to check on your leg sores. Our office will contact you regarding appointment(s) once made. Please schedule followup in 3-4 weeks with Dr Debby Bud, call sooner if problems.

## 2012-11-01 NOTE — Assessment & Plan Note (Signed)
BP Readings from Last 3 Encounters:  11/01/12 120/68  01/25/12 102/62  12/02/11 110/74   The current medical regimen is effective;  continue present plan and medications.

## 2012-11-17 DIAGNOSIS — K273 Acute peptic ulcer, site unspecified, without hemorrhage or perforation: Secondary | ICD-10-CM

## 2012-11-17 DIAGNOSIS — L97909 Non-pressure chronic ulcer of unspecified part of unspecified lower leg with unspecified severity: Secondary | ICD-10-CM

## 2012-11-17 DIAGNOSIS — I10 Essential (primary) hypertension: Secondary | ICD-10-CM

## 2012-11-17 DIAGNOSIS — I872 Venous insufficiency (chronic) (peripheral): Secondary | ICD-10-CM

## 2012-11-22 ENCOUNTER — Ambulatory Visit (INDEPENDENT_AMBULATORY_CARE_PROVIDER_SITE_OTHER): Payer: Medicare Other | Admitting: Internal Medicine

## 2012-11-22 ENCOUNTER — Encounter: Payer: Self-pay | Admitting: Internal Medicine

## 2012-11-22 VITALS — BP 130/72 | Temp 97.7°F | Resp 12 | Wt 141.0 lb

## 2012-11-22 DIAGNOSIS — I10 Essential (primary) hypertension: Secondary | ICD-10-CM

## 2012-11-22 DIAGNOSIS — I739 Peripheral vascular disease, unspecified: Secondary | ICD-10-CM

## 2012-11-22 DIAGNOSIS — Z8711 Personal history of peptic ulcer disease: Secondary | ICD-10-CM

## 2012-11-22 NOTE — Patient Instructions (Addendum)
Leg swelling and skin breakdown - looks better. Plan - you should wear knee high stockings that have a lot of elastic in them to keep the swelling under control.  Weight lost - you have lost 8 lbs since November of 2011. You have early satiety, your stomach fills quickly, due to your previous surgery. Plan Eat multiple small meals: 4-5 times a day  Consider 1 or 21 cans a day of ensure or something like that.  Circulation - doing OK. You had a circulation test in April '13 that was normal.  Heart - doing OK  Have  A Happy New Year - raise a glass of Chavis Regal for me.

## 2012-11-22 NOTE — Progress Notes (Signed)
Subjective:    Patient ID: Wesley [name], male    DOB: [birth year], 78 y.o.   MRN: [id number]  HPI Mr. Larue presents for follow -up venous insufficiency/peripheral edema with some skin breakdown. Now doing better with resolution of open sores. He still has some edema  He reports early satiety and has noted weight loss. He denies any pain with eating.  No chest pain, no SOB. He is current with cardiology/Dr Alanda Amass  Past Medical History  Diagnosis Date  . Osteoarthrosis, unspecified whether generalized or localized, other specified sites   . Acute kidney failure with other specified pathological lesion in kidney   . Prostatitis   . Postsurgical percutaneous transluminal coronary angioplasty status   . Cataract extraction status `  . Peripheral vascular disease, unspecified   . Personal history of malignant neoplasm of larynx   . Elevated prostate specific antigen (PSA)   . Gout, unspecified   . Acute peptic ulcer with hemorrhage   . Unspecified essential hypertension    Past Surgical History  Procedure Date  . Percutaneous translumional coronary angioplastty   . Cataract extraction   . Truncal vagotomy 06/2005  . Gastrojejunosotomy 06/2005  . Cholecystectomy 06/2005   Family History  Problem Relation Age of Onset  . Heart disease Father   . Cancer Brother    History   Social History  . Marital Status: Divorced    Spouse Name: N/A    Number of Children: N/A  . Years of Education: N/A   Occupational History  . Not on file.   Social History Main Topics  . Smoking status: Current Some Day Smoker    Types: Cigars  . Smokeless tobacco: Not on file  . Alcohol Use: Not on file  . Drug Use: Not on file  . Sexually Active: Not on file   Other Topics Concern  . Not on file   Social History Narrative   8th grade educationDivorced in 2004, Married in 1969, married in 1949Retired, worked at a cigarette facilityHas 2 sons: [phone], [phone] (both in good health)Lives  alone.End-of-life: wants CPR but no prolonged heroic measures.    Current Outpatient Prescriptions on File Prior to Visit  Medication Sig Dispense Refill  . amLODipine (NORVASC) 10 MG tablet Take 10 mg by mouth daily.        . furosemide (LASIX) 40 MG tablet Take 40 mg by mouth daily.        Marland Kitchen losartan (COZAAR) 50 MG tablet Take 50 mg by mouth daily.        . metoprolol (TOPROL-XL) 100 MG 24 hr tablet Take 150 mg by mouth daily.        . potassium chloride (KLOR-CON) 20 MEQ packet Take 20 mEq by mouth daily.        Marland Kitchen triamcinolone lotion (KENALOG) 0.1 % Apply topically 3 (three) times daily.  60 mL  0      Review of Systems System review is negative for any constitutional, cardiac, pulmonary, GI or neuro symptoms or complaints other than as described in the HPI.     Objective:   Physical Exam Filed Vitals:   11/22/12 0932  BP: 130/72  Temp: 97.7 F (36.5 C)  Resp: 12   Gen'l - elderly thin AA man in no distress Pulm - normal respirations Cor- 2+ radial, 1 + DP, mild edema to knee Neuro - awake and alert.  Derm - chronic skin changes both distal LE due to chronic edema and mild exzema  Assessment & Plan:  1. Peripheral edema with ulceration - resolved.  Plan - use of otc knee high support hose to reduce recurrent edema

## 2012-11-23 NOTE — Assessment & Plan Note (Signed)
Reviewed recent LE arterial doppler from [facility] - ABI were OK. No evidence on exam of vascular ulcers or injury

## 2012-11-23 NOTE — Assessment & Plan Note (Signed)
BP Readings from Last 3 Encounters:  11/22/12 130/72  11/01/12 120/68  01/25/12 102/62   Good control on present regimen

## 2012-11-23 NOTE — Assessment & Plan Note (Signed)
Patient with early satiety but no discomfort or symptoms of peptic ulcer disease. Suspect decreased motility and gastric capacity contribute to his early satiety. Reviewed vitals flowsheet - he has lost 8 pounds since '11, with 4 lb loss in the last year.  Plan Frequent small meals  Calorie supplement, e.g. Ensure twice a day

## 2012-12-14 ENCOUNTER — Encounter: Payer: Self-pay | Admitting: *Deleted

## 2012-12-14 ENCOUNTER — Telehealth: Payer: Self-pay | Admitting: *Deleted

## 2012-12-14 NOTE — Telephone Encounter (Signed)
Wesley [name] from [facility] request a new referral to admit to [facility]. Patient was admitted on 11/04/2012 but has not been seen yet for any home health nurse visits and would like to see the patient today. Will need to assess all needs and wound care per [facility] protocl.. Letter put on Dr. Debby Bud desk to sign and will be faxed to [place] [facility]. Letter and fax cover sheet given to [facility].Marland Kitchen

## 2012-12-14 NOTE — Telephone Encounter (Signed)
REFERRAL ADMIT LETTER SENT TO [facility]. BY FAX.

## 2013-01-17 ENCOUNTER — Other Ambulatory Visit (INDEPENDENT_AMBULATORY_CARE_PROVIDER_SITE_OTHER): Payer: Medicare Other

## 2013-01-17 ENCOUNTER — Other Ambulatory Visit: Payer: Self-pay | Admitting: Internal Medicine

## 2013-01-17 DIAGNOSIS — R5381 Other malaise: Secondary | ICD-10-CM

## 2013-01-17 DIAGNOSIS — R6889 Other general symptoms and signs: Secondary | ICD-10-CM

## 2013-01-17 DIAGNOSIS — R002 Palpitations: Secondary | ICD-10-CM

## 2013-01-17 LAB — COMPREHENSIVE METABOLIC PANEL
Alkaline Phosphatase: 91 U/L (ref 39–117)
CO2: 29 mEq/L (ref 19–32)
Creatinine, Ser: 1.3 mg/dL (ref 0.4–1.5)
GFR: 67.17 mL/min (ref >60.00)
Glucose, Bld: 78 mg/dL (ref 70–99)
Sodium: 138 mEq/L (ref 135–145)
Total Bilirubin: 1 mg/dL (ref 0.3–1.2)
Total Protein: 7.7 g/dL (ref 6.0–8.3)

## 2013-01-17 LAB — MAGNESIUM: Magnesium: 1.9 mg/dL (ref 1.5–2.5)

## 2013-01-17 LAB — CBC WITH DIFFERENTIAL/PLATELET
Basophils Absolute: 0 10*3/uL (ref 0.0–0.1)
Eosinophils Absolute: 0 10*3/uL (ref 0.0–0.7)
HCT: 35.9 % — ABNORMAL LOW (ref 39.0–52.0)
Hemoglobin: 12.1 g/dL — ABNORMAL LOW (ref 13.0–17.0)
MCV: 108.8 fl — ABNORMAL HIGH (ref 78.0–100.0)
RBC: 3.3 Mil/uL — ABNORMAL LOW (ref 4.22–5.81)
WBC: 3.7 10*3/uL — ABNORMAL LOW (ref 4.5–10.5)

## 2013-01-18 ENCOUNTER — Other Ambulatory Visit (HOSPITAL_COMMUNITY): Payer: Self-pay | Admitting: Cardiovascular Disease

## 2013-01-26 ENCOUNTER — Ambulatory Visit (HOSPITAL_COMMUNITY)
Admission: RE | Admit: 2013-01-26 | Discharge: 2013-01-26 | Disposition: A | Discharge status: Home / Self Care | Payer: Medicare Other | Source: Ambulatory Visit | Attending: Cardiovascular Disease | Admitting: Cardiovascular Disease

## 2013-01-26 DIAGNOSIS — R011 Cardiac murmur, unspecified: Secondary | ICD-10-CM | POA: Insufficient documentation

## 2013-01-26 DIAGNOSIS — R0602 Shortness of breath: Secondary | ICD-10-CM | POA: Insufficient documentation

## 2013-01-26 DIAGNOSIS — I447 Left bundle-branch block, unspecified: Secondary | ICD-10-CM | POA: Insufficient documentation

## 2013-01-26 NOTE — Progress Notes (Signed)
2D Echo Performed 01/26/2013    Tammie Crouch, RCS  

## 2013-07-04 ENCOUNTER — Encounter (HOSPITAL_COMMUNITY): Payer: Self-pay | Admitting: Emergency Medicine

## 2013-07-04 ENCOUNTER — Emergency Department (HOSPITAL_COMMUNITY): Payer: Medicare Other

## 2013-07-04 ENCOUNTER — Inpatient Hospital Stay (HOSPITAL_COMMUNITY)
Admission: EM | Admit: 2013-07-04 | Discharge: 2013-07-12 | DRG: 557 | Disposition: A | Discharge status: Home Health | Payer: Medicare Other | Attending: Internal Medicine | Admitting: Internal Medicine

## 2013-07-04 DIAGNOSIS — Z9861 Coronary angioplasty status: Secondary | ICD-10-CM

## 2013-07-04 DIAGNOSIS — E872 Acidosis, unspecified: Secondary | ICD-10-CM | POA: Diagnosis present

## 2013-07-04 DIAGNOSIS — E860 Dehydration: Secondary | ICD-10-CM

## 2013-07-04 DIAGNOSIS — N1330 Unspecified hydronephrosis: Secondary | ICD-10-CM

## 2013-07-04 DIAGNOSIS — W19XXXA Unspecified fall, initial encounter: Secondary | ICD-10-CM | POA: Diagnosis present

## 2013-07-04 DIAGNOSIS — E876 Hypokalemia: Secondary | ICD-10-CM

## 2013-07-04 DIAGNOSIS — Y92009 Unspecified place in unspecified non-institutional (private) residence as the place of occurrence of the external cause: Secondary | ICD-10-CM

## 2013-07-04 DIAGNOSIS — N179 Acute kidney failure, unspecified: Secondary | ICD-10-CM | POA: Diagnosis present

## 2013-07-04 DIAGNOSIS — R159 Full incontinence of feces: Secondary | ICD-10-CM | POA: Diagnosis present

## 2013-07-04 DIAGNOSIS — M1990 Unspecified osteoarthritis, unspecified site: Secondary | ICD-10-CM | POA: Diagnosis present

## 2013-07-04 DIAGNOSIS — N401 Enlarged prostate with lower urinary tract symptoms: Secondary | ICD-10-CM | POA: Diagnosis present

## 2013-07-04 DIAGNOSIS — F17200 Nicotine dependence, unspecified, uncomplicated: Secondary | ICD-10-CM | POA: Diagnosis present

## 2013-07-04 DIAGNOSIS — Q782 Osteopetrosis: Secondary | ICD-10-CM

## 2013-07-04 DIAGNOSIS — I739 Peripheral vascular disease, unspecified: Secondary | ICD-10-CM | POA: Diagnosis present

## 2013-07-04 DIAGNOSIS — C61 Malignant neoplasm of prostate: Secondary | ICD-10-CM | POA: Diagnosis present

## 2013-07-04 DIAGNOSIS — I10 Essential (primary) hypertension: Secondary | ICD-10-CM | POA: Diagnosis present

## 2013-07-04 DIAGNOSIS — N138 Other obstructive and reflux uropathy: Secondary | ICD-10-CM | POA: Diagnosis present

## 2013-07-04 DIAGNOSIS — D696 Thrombocytopenia, unspecified: Secondary | ICD-10-CM

## 2013-07-04 DIAGNOSIS — R972 Elevated prostate specific antigen [PSA]: Secondary | ICD-10-CM

## 2013-07-04 DIAGNOSIS — Z8521 Personal history of malignant neoplasm of larynx: Secondary | ICD-10-CM

## 2013-07-04 DIAGNOSIS — Z66 Do not resuscitate: Secondary | ICD-10-CM | POA: Diagnosis not present

## 2013-07-04 DIAGNOSIS — I5031 Acute diastolic (congestive) heart failure: Secondary | ICD-10-CM | POA: Diagnosis present

## 2013-07-04 DIAGNOSIS — M6282 Rhabdomyolysis: Principal | ICD-10-CM | POA: Diagnosis present

## 2013-07-04 DIAGNOSIS — J189 Pneumonia, unspecified organism: Secondary | ICD-10-CM

## 2013-07-04 DIAGNOSIS — R7401 Elevation of levels of liver transaminase levels: Secondary | ICD-10-CM | POA: Diagnosis present

## 2013-07-04 DIAGNOSIS — C140 Malignant neoplasm of pharynx, unspecified: Secondary | ICD-10-CM | POA: Diagnosis present

## 2013-07-04 DIAGNOSIS — N320 Bladder-neck obstruction: Secondary | ICD-10-CM

## 2013-07-04 DIAGNOSIS — Z8711 Personal history of peptic ulcer disease: Secondary | ICD-10-CM

## 2013-07-04 DIAGNOSIS — M109 Gout, unspecified: Secondary | ICD-10-CM | POA: Diagnosis present

## 2013-07-04 DIAGNOSIS — R7402 Elevation of levels of lactic acid dehydrogenase (LDH): Secondary | ICD-10-CM | POA: Diagnosis present

## 2013-07-04 DIAGNOSIS — R32 Unspecified urinary incontinence: Secondary | ICD-10-CM | POA: Diagnosis present

## 2013-07-04 DIAGNOSIS — I509 Heart failure, unspecified: Secondary | ICD-10-CM

## 2013-07-04 DIAGNOSIS — E43 Unspecified severe protein-calorie malnutrition: Secondary | ICD-10-CM

## 2013-07-04 DIAGNOSIS — W19XXXD Unspecified fall, subsequent encounter: Secondary | ICD-10-CM

## 2013-07-04 HISTORY — DX: Hyperlipidemia, unspecified: E78.5

## 2013-07-04 LAB — CBC WITH DIFFERENTIAL/PLATELET
Basophils Absolute: 0 10*3/uL (ref 0.0–0.1)
Eosinophils Relative: 0 % (ref 0–5)
HCT: 38.5 % — ABNORMAL LOW (ref 39.0–52.0)
Hemoglobin: 13.6 g/dL (ref 13.0–17.0)
Lymphocytes Relative: 7 % — ABNORMAL LOW (ref 12–46)
Lymphs Abs: 0.4 10*3/uL — ABNORMAL LOW (ref 0.7–4.0)
MCV: 103.2 fL — ABNORMAL HIGH (ref 78.0–100.0)
Monocytes Relative: 2 % — ABNORMAL LOW (ref 3–12)
Neutro Abs: 5.5 10*3/uL (ref 1.7–7.7)
RBC: 3.73 MIL/uL — ABNORMAL LOW (ref 4.22–5.81)
RDW: 13.5 % (ref 11.5–15.5)
WBC: 6 10*3/uL (ref 4.0–10.5)

## 2013-07-04 LAB — URINALYSIS W MICROSCOPIC + REFLEX CULTURE
Ketones, ur: 15 mg/dL — AB
Nitrite: NEGATIVE
Protein, ur: 100 mg/dL — AB
Urobilinogen, UA: 1 mg/dL (ref 0.0–1.0)

## 2013-07-04 LAB — COMPREHENSIVE METABOLIC PANEL
AST: 167 U/L — ABNORMAL HIGH (ref 0–37)
Albumin: 3.2 g/dL — ABNORMAL LOW (ref 3.5–5.2)
Alkaline Phosphatase: 77 U/L (ref 39–117)
BUN: 13 mg/dL (ref 6–23)
CO2: 21 mEq/L (ref 19–32)
Chloride: 94 mEq/L — ABNORMAL LOW (ref 96–112)
GFR calc non Af Amer: 61 mL/min — ABNORMAL LOW (ref >90)
Potassium: 3.2 mEq/L — ABNORMAL LOW (ref 3.5–5.1)
Total Bilirubin: 2.3 mg/dL — ABNORMAL HIGH (ref 0.3–1.2)

## 2013-07-04 LAB — MAGNESIUM: Magnesium: 1.4 mg/dL — ABNORMAL LOW (ref 1.5–2.5)

## 2013-07-04 MED ORDER — SODIUM CHLORIDE 0.9 % IV SOLN
INTRAVENOUS | Status: AC
  Administered 2013-07-04: 19:00:00 via INTRAVENOUS

## 2013-07-04 MED ORDER — HYDROCERIN EX CREA
TOPICAL_CREAM | Freq: Two times a day (BID) | CUTANEOUS | Status: DC
  Administered 2013-07-04 – 2013-07-09 (×10): via TOPICAL
  Administered 2013-07-09: 1 via TOPICAL
  Administered 2013-07-10 – 2013-07-12 (×5): via TOPICAL
  Filled 2013-07-04: qty 113

## 2013-07-04 MED ORDER — DOXYCYCLINE HYCLATE 100 MG PO TABS
100.0000 mg | ORAL_TABLET | Freq: Once | ORAL | Status: AC
  Administered 2013-07-04: 100 mg via ORAL
  Filled 2013-07-04: qty 1

## 2013-07-04 MED ORDER — POTASSIUM CHLORIDE CRYS ER 20 MEQ PO TBCR: 40.0000 meq | EXTENDED_RELEASE_TABLET | Freq: Once | ORAL | Status: DC

## 2013-07-04 MED ORDER — DEXTROSE 5 % IV SOLN
1.0000 g | Freq: Once | INTRAVENOUS | Status: AC
  Administered 2013-07-04: 1 g via INTRAVENOUS
  Filled 2013-07-04: qty 10

## 2013-07-04 MED ORDER — SODIUM CHLORIDE 0.9 % IV SOLN: INTRAVENOUS | Status: DC

## 2013-07-04 MED ORDER — ASPIRIN EC 81 MG PO TBEC
81.0000 mg | DELAYED_RELEASE_TABLET | Freq: Every day | ORAL | Status: DC
  Administered 2013-07-05 – 2013-07-12 (×7): 81 mg via ORAL
  Filled 2013-07-04 (×8): qty 1

## 2013-07-04 MED ORDER — SODIUM CHLORIDE 0.9 % IV SOLN
INTRAVENOUS | Status: DC
  Administered 2013-07-05 (×2): via INTRAVENOUS

## 2013-07-04 MED ORDER — DOXYCYCLINE HYCLATE 100 MG PO TABS
100.0000 mg | ORAL_TABLET | Freq: Two times a day (BID) | ORAL | Status: DC
  Administered 2013-07-04: 100 mg via ORAL
  Filled 2013-07-04 (×3): qty 1

## 2013-07-04 MED ORDER — DEXTROSE 5 % IV SOLN
1.0000 g | INTRAVENOUS | Status: DC
  Administered 2013-07-05: 1 g via INTRAVENOUS
  Filled 2013-07-04: qty 10

## 2013-07-04 MED ORDER — BIOTENE DRY MOUTH MT LIQD
15.0000 mL | Freq: Two times a day (BID) | OROMUCOSAL | Status: DC
  Administered 2013-07-04 – 2013-07-12 (×16): 15 mL via OROMUCOSAL

## 2013-07-04 MED ORDER — SODIUM CHLORIDE 0.9 % IJ SOLN
3.0000 mL | Freq: Two times a day (BID) | INTRAMUSCULAR | Status: DC
  Administered 2013-07-04 – 2013-07-11 (×6): 3 mL via INTRAVENOUS

## 2013-07-04 MED ORDER — PNEUMOCOCCAL VAC POLYVALENT 25 MCG/0.5ML IJ INJ
0.5000 mL | INJECTION | INTRAMUSCULAR | Status: AC
  Filled 2013-07-04: qty 0.5

## 2013-07-04 MED ORDER — MUPIROCIN 2 % EX OINT
TOPICAL_OINTMENT | Freq: Two times a day (BID) | CUTANEOUS | Status: DC
  Administered 2013-07-04 – 2013-07-12 (×16): via TOPICAL
  Filled 2013-07-04: qty 22

## 2013-07-04 MED ORDER — ENOXAPARIN SODIUM 40 MG/0.4ML ~~LOC~~ SOLN
40.0000 mg | SUBCUTANEOUS | Status: DC
  Administered 2013-07-05 – 2013-07-07 (×2): 40 mg via SUBCUTANEOUS
  Filled 2013-07-04 (×5): qty 0.4

## 2013-07-04 MED ORDER — SODIUM CHLORIDE 0.9 % IV BOLUS (SEPSIS)
500.0000 mL | Freq: Once | INTRAVENOUS | Status: AC
  Administered 2013-07-04: 500 mL via INTRAVENOUS

## 2013-07-04 NOTE — Progress Notes (Signed)
Wesley [name] [id number] Admission Data: 07/04/2013 7:22 PM Attending Provider: Renae Fickle, MD  [id number] Norins, MD Consults/ Treatment Team:    Wesley [name] is a 79 y.o. male patient admitted from ED awake, alert  & orientated  X 3,  Full Code, VSS - Blood pressure 149/70, pulse 91, temperature 98.4 F (36.9 C), temperature source Rectal, resp. rate 16, SpO2 97.00%., , no c/o shortness of breath, no c/o chest pain, no distress noted. Tele # [id number] placed and pt is currently running:normal sinus rhythm.   IV site WDL:  forearm right, condition patent and no redness with a transparent dsg that's clean dry and intact.  Allergies:  No Known Allergies   Past Medical History  Diagnosis Date  . Osteoarthrosis, unspecified whether generalized or localized, other specified sites   . Acute kidney failure with other specified pathological lesion in kidney   . Prostatitis   . Postsurgical percutaneous transluminal coronary angioplasty status     weintraub  . Cataract extraction status `  . Peripheral vascular disease, unspecified   . Personal history of malignant neoplasm of larynx   . Elevated prostate specific antigen (PSA)   . Gout, unspecified   . Acute peptic ulcer with hemorrhage   . Unspecified essential hypertension   . Hyperlipidemia     History:  obtained from the patient.   Pt orientation to unit, room and routine. Information packet given to patient.  Admission INP armband ID verified with patient, and in place. SR up x 2, fall risk assessment complete with Patient and family verbalizing understanding of risks associated with falls. Pt verbalizes an understanding of how to use the call bell and to call for help before getting out of bed.    Will cont to monitor and assist as needed.  Cindra Eves, RN 07/04/2013 7:22 PM

## 2013-07-04 NOTE — ED Provider Notes (Signed)
CSN: [id number]     Arrival date & time 07/04/13  0944 History     First MD Initiated Contact with Patient 07/04/13 1031     Chief Complaint  Patient presents with  . Neck Pain  . Fall  . Skin Ulcer    HPI Pt was seen at 1030. Per EMS and pt report, c/o sudden onset and resolution of one episode of fall at home yesterday. Pt states he was walking and "just fell because I'm 89 after all."  Pt states he was too weak to be able to get up on his own, so he has laid on the floor until this morning when he was found by his caretaker.  Pt states he has had several intermittent episodes of N/V since last night. EMS noted pt was incont of urine on their arrival to scene.  Denies syncope, no CP/palpitations, no abd pain, no N/V/D, no back pain, no focal motor weakness, no tingling/numbness in extremities.    Past Medical History  Diagnosis Date  . Osteoarthrosis, unspecified whether generalized or localized, other specified sites   . Acute kidney failure with other specified pathological lesion in kidney   . Prostatitis   . Postsurgical percutaneous transluminal coronary angioplasty status   . Cataract extraction status `  . Peripheral vascular disease, unspecified   . Personal history of malignant neoplasm of larynx   . Elevated prostate specific antigen (PSA)   . Gout, unspecified   . Acute peptic ulcer with hemorrhage   . Unspecified essential hypertension    Past Surgical History  Procedure Laterality Date  . Percutaneous translumional coronary angioplastty    . Cataract extraction    . Truncal vagotomy  06/2005  . Gastrojejunosotomy  06/2005  . Cholecystectomy  06/2005   Family History  Problem Relation Age of Onset  . Heart disease Father   . Cancer Brother    History  Substance Use Topics  . Smoking status: Current Some Day Smoker    Types: Cigars  . Smokeless tobacco: Not on file  . Alcohol Use: Not on file    Review of Systems ROS: Statement: All systems negative  except as marked or noted in the HPI; Constitutional: Negative for fever and chills. +generalized weakness/fatigue.; ; Eyes: Negative for eye pain, redness and discharge. ; ; ENMT: Negative for ear pain, hoarseness, nasal congestion, sinus pressure and sore throat. ; ; Cardiovascular: Negative for chest pain, palpitations, diaphoresis, dyspnea and peripheral edema. ; ; Respiratory: Negative for cough, wheezing and stridor. ; ; Gastrointestinal: +N/V. Negative for diarrhea, abdominal pain, blood in stool, hematemesis, jaundice and rectal bleeding. . ; ; Genitourinary: Negative for dysuria, flank pain and hematuria. ; ; Musculoskeletal: +neck pain. Negative for back pain. Negative for swelling and trauma.; ; Skin: +abrasions. Negative for pruritus, rash, blisters, bruising and skin lesion.; ; Neuro: Negative for headache, lightheadedness and neck stiffness. Negative for weakness, altered level of consciousness , altered mental status, extremity weakness, paresthesias, involuntary movement, seizure and syncope.      Allergies  Review of patient's allergies indicates no known allergies.  Home Medications   Current Outpatient Rx  Name  Route  Sig  Dispense  Refill  . amLODipine (NORVASC) 5 MG tablet   Oral   Take 5 mg by mouth daily.         Marland Kitchen aspirin EC 81 MG tablet   Oral   Take 81 mg by mouth daily.         . metoprolol (  LOPRESSOR) 50 MG tablet   Oral   Take 50 mg by mouth daily.         Marland Kitchen PRESCRIPTION MEDICATION   Topical   Apply 1 application topically daily. Medicated cream for sores on legs          BP 99/54  Pulse 89  Temp(Src) 99.5 F (37.5 C) (Rectal)  SpO2 97% Physical Exam 1035: Physical examination:  Nursing notes reviewed; Vital signs and O2 SAT reviewed;  Constitutional: Thin, frail. In no acute distress; Head:  Normocephalic, atraumatic; Eyes: EOMI, PERRL, No scleral icterus; ENMT: Mouth and pharynx normal, Mucous membranes dry; Neck: Supple, Full range of  motion, No lymphadenopathy; Cardiovascular: Regular rate and rhythm, No gallop; Respiratory: Breath sounds coarse & equal bilaterally, No wheezes.  Speaking full sentences with ease, Normal respiratory effort/excursion; Chest: Nontender, Movement normal; Abdomen: Soft, Nontender, Nondistended, Normal bowel sounds; Genitourinary: No CVA tenderness; Spine:  No midline CS, TS, LS tenderness.  +TTP left hypertonic trapezius muscle.;; Extremities: Pulses normal, Pelvis stable. No deformity. No tenderness, No edema, No calf asymmetry.; Neuro: AA&Ox3, Major CN grossly intact. No facial droop. Speech clear. No gross focal motor or sensory deficits in extremities.; Skin: Color normal, Warm, Dry, +skin tear left anterior tibial area.   ED Course   Procedures     MDM  MDM Reviewed: previous chart, nursing note and vitals Reviewed previous: labs and ECG Interpretation: labs, ECG, x-ray and CT scan    Date: 07/04/2013  Rate: 100  Rhythm: sinus tachycardia  QRS Axis: normal  Intervals: normal  ST/T Wave abnormalities: nonspecific ST/T changes  Conduction Disutrbances:left bundle branch block  Narrative Interpretation:   Old EKG Reviewed: changes noted; NS STTW changes new since previous EKG dated 02/14/2011.  Results for orders placed during the hospital encounter of 07/04/13  URINALYSIS W MICROSCOPIC + REFLEX CULTURE      Result Value Range   Color, Urine AMBER (*) YELLOW   APPearance CLEAR  CLEAR   Specific Gravity, Urine 1.015  1.005 - 1.030   pH 5.5  5.0 - 8.0   Glucose, UA NEGATIVE  NEGATIVE mg/dL   Hgb urine dipstick LARGE (*) NEGATIVE   Bilirubin Urine SMALL (*) NEGATIVE   Ketones, ur 15 (*) NEGATIVE mg/dL   Protein, ur 161 (*) NEGATIVE mg/dL   Urobilinogen, UA 1.0  0.0 - 1.0 mg/dL   Nitrite NEGATIVE  NEGATIVE   Leukocytes, UA NEGATIVE  NEGATIVE   WBC, UA 0-2  <3 WBC/hpf   RBC / HPF 3-6  <3 RBC/hpf   Squamous Epithelial / LPF RARE  RARE   Casts GRANULAR CAST (*) NEGATIVE    Urine-Other AMORPHOUS URATES/PHOSPHATES    CBC WITH DIFFERENTIAL      Result Value Range   WBC 6.0  4.0 - 10.5 K/uL   RBC 3.73 (*) 4.22 - 5.81 MIL/uL   Hemoglobin 13.6  13.0 - 17.0 g/dL   HCT 09.6 (*) 04.5 - 40.9 %   MCV 103.2 (*) 78.0 - 100.0 fL   MCH 36.5 (*) 26.0 - 34.0 pg   MCHC 35.3  30.0 - 36.0 g/dL   RDW 81.1  91.4 - 78.2 %   Platelets 128 (*) 150 - 400 K/uL   Neutrophils Relative % 91 (*) 43 - 77 %   Lymphocytes Relative 7 (*) 12 - 46 %   Monocytes Relative 2 (*) 3 - 12 %   Eosinophils Relative 0  0 - 5 %   Basophils Relative 0  0 -  1 %   Neutro Abs 5.5  1.7 - 7.7 K/uL   Lymphs Abs 0.4 (*) 0.7 - 4.0 K/uL   Monocytes Absolute 0.1  0.1 - 1.0 K/uL   Eosinophils Absolute 0.0  0.0 - 0.7 K/uL   Basophils Absolute 0.0  0.0 - 0.1 K/uL   WBC Morphology INCREASED BANDS (>20% BANDS)    COMPREHENSIVE METABOLIC PANEL      Result Value Range   Sodium 135  135 - 145 mEq/L   Potassium 3.2 (*) 3.5 - 5.1 mEq/L   Chloride 94 (*) 96 - 112 mEq/L   CO2 21  19 - 32 mEq/L   Glucose, Bld 126 (*) 70 - 99 mg/dL   BUN 13  6 - 23 mg/dL   Creatinine, Ser 1.61  0.50 - 1.35 mg/dL   Calcium 8.8  8.4 - 09.6 mg/dL   Total Protein 7.5  6.0 - 8.3 g/dL   Albumin 3.2 (*) 3.5 - 5.2 g/dL   AST 045 (*) 0 - 37 U/L   ALT 50  0 - 53 U/L   Alkaline Phosphatase 77  39 - 117 U/L   Total Bilirubin 2.3 (*) 0.3 - 1.2 mg/dL   GFR calc non Af Amer 61 (*) >90 mL/min   GFR calc Af Amer 71 (*) >90 mL/min  LIPASE, BLOOD      Result Value Range   Lipase 13  11 - 59 U/L  LACTIC ACID, PLASMA      Result Value Range   Lactic Acid, Venous 7.1 (*) 0.5 - 2.2 mmol/L  TROPONIN I      Result Value Range   Troponin I <0.30  <0.30 ng/mL  CK      Result Value Range   Total CK 7169 (*) 7 - 232 U/L   Dg Chest 2 View 07/04/2013   *RADIOLOGY REPORT*  Clinical Data: Multiple recent falls.  Prior history of head neck cancer for which the patient underwent radiation therapy. Productive cough.  CHEST - 2 VIEW  Comparison: Two-view  chest x-ray 02/15/2011, 10/20/2007, 04/15/1007.  Findings: Airspace consolidation involving the right lower lobe. This is superimposed upon baseline changes of COPD/emphysema. Stable scarring in the left lower lobe and stable biapical pleuroparenchymal scarring.  Cardiac silhouette normal in size, unchanged.  Thoracic aorta tortuous and atherosclerotic, unchanged. Prominent central right pulmonary artery, unchanged.  Degenerative changes involving the thoracic spine.  IMPRESSION: Right lower lobe pneumonia superimposed upon COPD/emphysema. Stable scarring in the left lower lobe and stable biapical pleuroparenchymal scarring.   Original Report Authenticated By: Hulan Saas, M.D.   Ct Head Wo Contrast 07/04/2013   *RADIOLOGY REPORT*  Clinical Data:  79 year old who lives alone and fell out of his recliner at home yesterday, and was unable to get up afterward. The patient was found laying on the floor earlier today.  The patient denies loss of consciousness.  CT HEAD WITHOUT CONTRAST CT CERVICAL SPINE WITHOUT CONTRAST  Technique:  Multidetector CT imaging of the head and cervical spine was performed following the standard protocol without intravenous contrast.  Multiplanar CT image reconstructions of the cervical spine were also generated.  Comparison:  CT head 08/08/2005.  No prior cervical spine imaging.  CT HEAD  Findings: Moderate to severe cortical and deep atrophy, unchanged from the prior examination.  Moderate to severe changes of small vessel disease of the white matter diffusely, including the brain stem, unchanged.  No mass lesion.  No midline shift.  No acute hemorrhage or hematoma.  No  extra-axial fluid collections.  No evidence of acute infarction.  No significant interval change.  Left parietal scalp hematoma at the vertex without underlying skull fracture. Visualized paranasal sinuses, bilateral mastoid air cells, and bilateral middle ear cavities well-aerated.  Bilateral carotid siphon and left  vertebral artery atherosclerosis.  IMPRESSION:  1.  No acute intracranial abnormality. 2.  Stable severe generalized atrophy and moderate to severe chronic microvascular ischemic changes of the white matter. 3.  Left parietal scalp hematoma at the vertex without underlying skull fracture.  CT CERVICAL SPINE  Findings: No fractures identified involving the cervical spine. Multilevel degenerative disc disease and spondylosis, worst at C3- 4, C5-6 and C6-7, with moderate spinal stenosis at the C2-3 and C6- 7 levels.  Generalized osseous demineralization.  Sclerosis involving the C3 vertebral body, the lower endplate of C7, the upper endplate of T3, and the right pedicle and transverse process of T3.  Facet joints intact with severe diffuse degenerative changes.  Coronal reformatted images demonstrate an intact craniocervical junction, intact C1-C2 articulation with degenerative changes, intact dens, and intact lateral masses. Combination of facet and uncinate hypertrophy account for multilevel foraminal stenoses including mild bilateral C2-3, severe bilateral C3-4, moderate right severe left C4-5, moderate right and severe left C5-6, severe bilateral C6-7, and moderate right C7-T1. Pleuroparenchymal scarring in both lung apices is noted.  IMPRESSION:  1.  No cervical spine fractures. 2.  Multilevel degenerative disc disease, spondylosis, and facet degenerative changes with moderate spinal stenosis at C3-4 and C6- 7.  Multilevel foraminal stenoses as detailed above. 3.  Sclerosis involving C3, C7, T3, and the right pedicle and transverse process of T3.  Sclerotic metastases are favored over degenerative changes.  Is there a history of prostate cancer?   Original Report Authenticated By: Hulan Saas, M.D.   Ct Cervical Spine Wo Contrast 07/04/2013   *RADIOLOGY REPORT*  Clinical Data:  79 year old who lives alone and fell out of his recliner at home yesterday, and was unable to get up afterward. The patient was  found laying on the floor earlier today.  The patient denies loss of consciousness.  CT HEAD WITHOUT CONTRAST CT CERVICAL SPINE WITHOUT CONTRAST  Technique:  Multidetector CT imaging of the head and cervical spine was performed following the standard protocol without intravenous contrast.  Multiplanar CT image reconstructions of the cervical spine were also generated.  Comparison:  CT head 08/08/2005.  No prior cervical spine imaging.  CT HEAD  Findings: Moderate to severe cortical and deep atrophy, unchanged from the prior examination.  Moderate to severe changes of small vessel disease of the white matter diffusely, including the brain stem, unchanged.  No mass lesion.  No midline shift.  No acute hemorrhage or hematoma.  No extra-axial fluid collections.  No evidence of acute infarction.  No significant interval change.  Left parietal scalp hematoma at the vertex without underlying skull fracture. Visualized paranasal sinuses, bilateral mastoid air cells, and bilateral middle ear cavities well-aerated.  Bilateral carotid siphon and left vertebral artery atherosclerosis.  IMPRESSION:  1.  No acute intracranial abnormality. 2.  Stable severe generalized atrophy and moderate to severe chronic microvascular ischemic changes of the white matter. 3.  Left parietal scalp hematoma at the vertex without underlying skull fracture.  CT CERVICAL SPINE  Findings: No fractures identified involving the cervical spine. Multilevel degenerative disc disease and spondylosis, worst at C3- 4, C5-6 and C6-7, with moderate spinal stenosis at the C2-3 and C6- 7 levels.  Generalized osseous demineralization.  Sclerosis involving  the C3 vertebral body, the lower endplate of C7, the upper endplate of T3, and the right pedicle and transverse process of T3.  Facet joints intact with severe diffuse degenerative changes.  Coronal reformatted images demonstrate an intact craniocervical junction, intact C1-C2 articulation with degenerative  changes, intact dens, and intact lateral masses. Combination of facet and uncinate hypertrophy account for multilevel foraminal stenoses including mild bilateral C2-3, severe bilateral C3-4, moderate right severe left C4-5, moderate right and severe left C5-6, severe bilateral C6-7, and moderate right C7-T1. Pleuroparenchymal scarring in both lung apices is noted.  IMPRESSION:  1.  No cervical spine fractures. 2.  Multilevel degenerative disc disease, spondylosis, and facet degenerative changes with moderate spinal stenosis at C3-4 and C6- 7.  Multilevel foraminal stenoses as detailed above. 3.  Sclerosis involving C3, C7, T3, and the right pedicle and transverse process of T3.  Sclerotic metastases are favored over degenerative changes.  Is there a history of prostate cancer?   Original Report Authenticated By: Hulan Saas, M.D.     1300:  Pt has been laying on the floor for at least 1 day; CK and lactic elevated accordingly. Will dose judicious IVF given pt's age. No further N/V while in the ED. Will tx RLL infiltrate with abx (prolonged QTc on EKG so will tx with rocephin and doxycycline).  T/C to [facility] Dr. Malachi Bonds, case discussed, including:  HPI, pertinent PM/SHx, VS/PE, dx testing, ED course and treatment:  Agreeable to admit, requests to write temporary orders, obtain tele bed to team 7.      Laray Anger, DO 07/06/13 [phone]

## 2013-07-04 NOTE — ED Notes (Signed)
Pt placed in hospital gown after having a small bm in bed.

## 2013-07-04 NOTE — ED Notes (Signed)
Wesley [name] is a 79 y.o. male Chief Complaint  Patient presents with  . Neck Pain  . Fall    Per [facility] patient fell while getting out of reclining chair yesterday afteroon approximately 4 pm. No loc. He has been laying on the floor since then and could not get up. Lives alone and was found on floor this morning by caretaker.  Arrives in [place]. Patient was incontinent after falling because he could not get up.  Patient is alert and oriented x 4.  Has stage 1 pressure wounds to R knee and R hip.

## 2013-07-04 NOTE — H&P (Addendum)
[facility] History and Physical  Wesley [name] ZOX:[id number] DOB: March 14, 1920 DOA: 07/04/2013  Referring physician:  Samuel Jester PCP:  Illene Regulus, MD   Chief Complaint:  Fall   HPI:  The patient is a 79 y.o. year-old male with history of peripheral vascular disease, elevated PSA/prostatitis, gout, HTN, HLD, throat cancer status post treatment in remission, who presents with fall and inability to get up.  The patient was last at their baseline health two days prior to admission.  Two nights prior to admission (36 hours ago), the patient had been sleeping in a recliner and attempted to get up. He sustained a mechanical fall without head trauma or loss of consciousness. He felt so weak however if patient was unable to get up and laid on the floor for the next day and a half. An 8 comes to check on him every other day found him on the floor and called 911. He did not have access to food or water during this time and was incontinent of bowel and bladder.  He states that he has been coughing somewhat more than usual and has had productive sputum that is yellowish. He denies fevers and chills. He denies sinus congestion or rhinorrhea. He had one episode of possibly bilious emesis this morning followed by some diarrhea. Diarrhea was an described as soft stool without blood or mucus.    In the emergency department, labs were notable for BUN 13, creatinine 1.03, AST 167, ALT 50, total bilirubin 2.3, CK total 7169, troponin less than 0.3, lactic acid 7.1.  CBC was notable for platelets of 128 and increased neutrophils with bands present. Urinalysis had 0-2 wbc's and was negative for nitrites and leukocytes. Granular casts were present.  CT of the head and cervical spine demonstrated no acute intracranial abnormality or hematoma. He had stable severe generalized atrophy and left parietal scalp hematoma.  There were no apparent cervical spine fractures although he had multilevel degenerative disc disease  with foraminal disc stenosis there was moderate at C3-4 and C6-7. There is also sclerosis of multiple vertebrae metastases but suggestive of sclerotic metastases, question prostate cancer.  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  Review of Systems:  Denies fevers, chills, weight loss or gain, changes to hearing and vision.  Denies rhinorrhea, sinus congestion, sore throat.  Denies chest pain and palpitations.  + SOB, wheezing, cough.  Denies dysuria, frequency, urgency, polyuria, polydipsia.  Denies hematemesis, blood in stools, melena, abnormal bruising or bleeding.  Denies lymphadenopathy.  Pain in low back without radiation to the legs.  Abrasion left shin and stage pressure ulcers of the right side of bilateral knees.  Denies lower extremity edema.  Denies focal numbness, weakness, slurred speech, confusion, facial droop.  Denies anxiety and depression.    Past Medical History  Diagnosis Date  . Osteoarthrosis, unspecified whether generalized or localized, other specified sites   . Acute kidney failure with other specified pathological lesion in kidney   . Prostatitis   . Postsurgical percutaneous transluminal coronary angioplasty status     weintraub  . Cataract extraction status `  . Peripheral vascular disease, unspecified   . Personal history of malignant neoplasm of larynx   . Elevated prostate specific antigen (PSA)   . Gout, unspecified   . Acute peptic ulcer with hemorrhage   . Unspecified essential hypertension   . Hyperlipidemia    Past Surgical History  Procedure Laterality Date  . Percutaneous translumional coronary angioplastty  stents to bilateral legs for PVD  . Cataract extraction    . Truncal vagotomy  06/2005  . Gastrojejunosotomy  06/2005  . Cholecystectomy  06/2005   Social History:  reports that he has been smoking Cigarettes and Cigars.  He has been smoking about 0.50 packs per day. He does not have any  smokeless tobacco history on file. He reports that  drinks alcohol. He reports that he does not use illicit drugs. Lives alone.    No Known Allergies  Family History  Problem Relation Age of Onset  . Heart disease Father   . Prostate cancer Brother   . Heart disease Brother      Prior to Admission medications   Medication Sig Start Date End Date Taking? Authorizing Provider  amLODipine (NORVASC) 5 MG tablet Take 5 mg by mouth daily.   Yes Historical Provider, MD  aspirin EC 81 MG tablet Take 81 mg by mouth daily.   Yes Historical Provider, MD  metoprolol (LOPRESSOR) 50 MG tablet Take 50 mg by mouth daily.   Yes Historical Provider, MD  PRESCRIPTION MEDICATION Apply 1 application topically daily. Medicated cream for sores on legs   Yes Historical Provider, MD   Physical Exam: Filed Vitals:   07/04/13 1330 07/04/13 1400 07/04/13 1415 07/04/13 1430  BP: 137/88 174/84 151/75 142/76  Pulse: 79 90 91 91  Temp:      TempSrc:      Resp: 18 23 19 20   SpO2: 94% 100% 96% 99%     General:   Cachectic African American male, in no acute distress  Eyes:  PERRL, anicteric, non-injected.  ENT:  Nares clear.  OP clear, non-erythematous without plaques or exudates.  MMM.  Neck:  Supple without TM or JVD.    Lymph:  No cervical, supraclavicular, or submandibular LAD.  Cardiovascular:  RRR, normal S1, S2, without m/r/g.  2+ pulses, warm extremities  Respiratory:   Diminished breath sounds at the right lower lobe without focal rales, rhonchi or wheeze, no increased WOB.  Abdomen:  NABS.  Soft, ND/NT.    Skin:  No rashes, however there is non-feeding erythema of the right side of bilateral knees suggestive of stage I pressure ulcers and to the left shin has multiple areas where the skin is torn away.    Musculoskeletal:  Normal bulk and tone.  No LE edema.  Psychiatric:  A & O x 4.  Appropriate affect.  Neurologic:  CN 3-12 intact.  5/5 strength.  Sensation intact.  Labs on Admission:   Basic Metabolic Panel:  Recent Labs Lab 07/04/13 1121  NA 135  K 3.2*  CL 94*  CO2 21  GLUCOSE 126*  BUN 13  CREATININE 1.03  CALCIUM 8.8   Liver Function Tests:  Recent Labs Lab 07/04/13 1121  AST 167*  ALT 50  ALKPHOS 77  BILITOT 2.3*  PROT 7.5  ALBUMIN 3.2*    Recent Labs Lab 07/04/13 1121  LIPASE 13   No results found for this basename: AMMONIA,  in the last 168 hours CBC:  Recent Labs Lab 07/04/13 1121  WBC 6.0  NEUTROABS 5.5  HGB 13.6  HCT 38.5*  MCV 103.2*  PLT 128*   Cardiac Enzymes:  Recent Labs Lab 07/04/13 1121  CKTOTAL 7169*  TROPONINI <0.30    BNP (last 3 results) No results found for this basename: PROBNP,  in the last 8760 hours CBG: No results found for this basename: GLUCAP,  in the last 168 hours  Radiological Exams on Admission: Dg Chest 2 View  07/04/2013   *RADIOLOGY REPORT*  Clinical Data: Multiple recent falls.  Prior history of head neck cancer for which the patient underwent radiation therapy. Productive cough.  CHEST - 2 VIEW  Comparison: Two-view chest x-ray 02/15/2011, 10/20/2007, 04/15/1007.  Findings: Airspace consolidation involving the right lower lobe. This is superimposed upon baseline changes of COPD/emphysema. Stable scarring in the left lower lobe and stable biapical pleuroparenchymal scarring.  Cardiac silhouette normal in size, unchanged.  Thoracic aorta tortuous and atherosclerotic, unchanged. Prominent central right pulmonary artery, unchanged.  Degenerative changes involving the thoracic spine.  IMPRESSION: Right lower lobe pneumonia superimposed upon COPD/emphysema. Stable scarring in the left lower lobe and stable biapical pleuroparenchymal scarring.   Original Report Authenticated By: Hulan Saas, M.D.   Ct Head Wo Contrast  07/04/2013   *RADIOLOGY REPORT*  Clinical Data:  79 year old who lives alone and fell out of his recliner at home yesterday, and was unable to get up afterward. The patient was  found laying on the floor earlier today.  The patient denies loss of consciousness.  CT HEAD WITHOUT CONTRAST CT CERVICAL SPINE WITHOUT CONTRAST  Technique:  Multidetector CT imaging of the head and cervical spine was performed following the standard protocol without intravenous contrast.  Multiplanar CT image reconstructions of the cervical spine were also generated.  Comparison:  CT head 08/08/2005.  No prior cervical spine imaging.  CT HEAD  Findings: Moderate to severe cortical and deep atrophy, unchanged from the prior examination.  Moderate to severe changes of small vessel disease of the white matter diffusely, including the brain stem, unchanged.  No mass lesion.  No midline shift.  No acute hemorrhage or hematoma.  No extra-axial fluid collections.  No evidence of acute infarction.  No significant interval change.  Left parietal scalp hematoma at the vertex without underlying skull fracture. Visualized paranasal sinuses, bilateral mastoid air cells, and bilateral middle ear cavities well-aerated.  Bilateral carotid siphon and left vertebral artery atherosclerosis.  IMPRESSION:  1.  No acute intracranial abnormality. 2.  Stable severe generalized atrophy and moderate to severe chronic microvascular ischemic changes of the white matter. 3.  Left parietal scalp hematoma at the vertex without underlying skull fracture.  CT CERVICAL SPINE  Findings: No fractures identified involving the cervical spine. Multilevel degenerative disc disease and spondylosis, worst at C3- 4, C5-6 and C6-7, with moderate spinal stenosis at the C2-3 and C6- 7 levels.  Generalized osseous demineralization.  Sclerosis involving the C3 vertebral body, the lower endplate of C7, the upper endplate of T3, and the right pedicle and transverse process of T3.  Facet joints intact with severe diffuse degenerative changes.  Coronal reformatted images demonstrate an intact craniocervical junction, intact C1-C2 articulation with degenerative  changes, intact dens, and intact lateral masses. Combination of facet and uncinate hypertrophy account for multilevel foraminal stenoses including mild bilateral C2-3, severe bilateral C3-4, moderate right severe left C4-5, moderate right and severe left C5-6, severe bilateral C6-7, and moderate right C7-T1. Pleuroparenchymal scarring in both lung apices is noted.  IMPRESSION:  1.  No cervical spine fractures. 2.  Multilevel degenerative disc disease, spondylosis, and facet degenerative changes with moderate spinal stenosis at C3-4 and C6- 7.  Multilevel foraminal stenoses as detailed above. 3.  Sclerosis involving C3, C7, T3, and the right pedicle and transverse process of T3.  Sclerotic metastases are favored over degenerative changes.  Is there a history of prostate cancer?   Original Report Authenticated By:  Hulan Saas, M.D.   Ct Cervical Spine Wo Contrast  07/04/2013   *RADIOLOGY REPORT*  Clinical Data:  79 year old who lives alone and fell out of his recliner at home yesterday, and was unable to get up afterward. The patient was found laying on the floor earlier today.  The patient denies loss of consciousness.  CT HEAD WITHOUT CONTRAST CT CERVICAL SPINE WITHOUT CONTRAST  Technique:  Multidetector CT imaging of the head and cervical spine was performed following the standard protocol without intravenous contrast.  Multiplanar CT image reconstructions of the cervical spine were also generated.  Comparison:  CT head 08/08/2005.  No prior cervical spine imaging.  CT HEAD  Findings: Moderate to severe cortical and deep atrophy, unchanged from the prior examination.  Moderate to severe changes of small vessel disease of the white matter diffusely, including the brain stem, unchanged.  No mass lesion.  No midline shift.  No acute hemorrhage or hematoma.  No extra-axial fluid collections.  No evidence of acute infarction.  No significant interval change.  Left parietal scalp hematoma at the vertex without  underlying skull fracture. Visualized paranasal sinuses, bilateral mastoid air cells, and bilateral middle ear cavities well-aerated.  Bilateral carotid siphon and left vertebral artery atherosclerosis.  IMPRESSION:  1.  No acute intracranial abnormality. 2.  Stable severe generalized atrophy and moderate to severe chronic microvascular ischemic changes of the white matter. 3.  Left parietal scalp hematoma at the vertex without underlying skull fracture.  CT CERVICAL SPINE  Findings: No fractures identified involving the cervical spine. Multilevel degenerative disc disease and spondylosis, worst at C3- 4, C5-6 and C6-7, with moderate spinal stenosis at the C2-3 and C6- 7 levels.  Generalized osseous demineralization.  Sclerosis involving the C3 vertebral body, the lower endplate of C7, the upper endplate of T3, and the right pedicle and transverse process of T3.  Facet joints intact with severe diffuse degenerative changes.  Coronal reformatted images demonstrate an intact craniocervical junction, intact C1-C2 articulation with degenerative changes, intact dens, and intact lateral masses. Combination of facet and uncinate hypertrophy account for multilevel foraminal stenoses including mild bilateral C2-3, severe bilateral C3-4, moderate right severe left C4-5, moderate right and severe left C5-6, severe bilateral C6-7, and moderate right C7-T1. Pleuroparenchymal scarring in both lung apices is noted.  IMPRESSION:  1.  No cervical spine fractures. 2.  Multilevel degenerative disc disease, spondylosis, and facet degenerative changes with moderate spinal stenosis at C3-4 and C6- 7.  Multilevel foraminal stenoses as detailed above. 3.  Sclerosis involving C3, C7, T3, and the right pedicle and transverse process of T3.  Sclerotic metastases are favored over degenerative changes.  Is there a history of prostate cancer?   Original Report Authenticated By: Hulan Saas, M.D.    EKG: Pending in system, but reportedly  prolonged QTc.    Assessment/Plan Active Problems:   HYPERTENSION   PERIPHERAL VASCULAR DISEASE   NEOPLASM, MALIGNANT, LARYNX, HX OF   PEPTIC ULCER, ACUTE, HEMORRHAGE, HX OF   Dehydration   Lactic acidosis   Rhabdomyolysis   Fall  Rhabdomyolysis due to dehydration after fall -  Aggressive IV fluid, monitoring closely for dyspnea and hypoxia -  Repeat CPK in the morning -  Trend creatinine  Lactic acidosis, also likely due to dehydration, continue IV fluids.  No history of abdominal pain and abdomen is nontender on exam. -  Repeat lactic acid in a few hours and again the morning  Community-acquired pneumonia with underlying emphysema -  Continue ceftriaxone  and doxycycline (atypical coverage with less QTc prolongation) -  May develop increased symptoms after initiation of IV fluids -  Consider steroids and nebulizer treatments if respiratory status worsens  Granular casts present on urinalysis are suggestive of some ATN due to dehydration -  Trend creatinine  Bony sclerosis, may be due to history of head and neck cancer or prostate cancer particularly given history of elevated PSA -  Repeat PSA  Nausea and vomiting, seemed to have resolved -  Zofran when necessary -  Consider KUB if recurs  Peripheral vascular disease, continue aspirin  Hypertension/hyperlipidemia, not on statin medication. Hold blood pressure medications given orthostasis and severe dehydration.  Fall with inability to get up, likely exacerbated by underlying infection -  PT and OT evaluations -  Anticipate that he will likely need placement pending discharge because he lives alone.  Gout, stable.  Cigarette abuse and dependence -  Counseled cessation  Prolonged Qtc, may have been effected by electrolyte and pH disturbance -  Check magnesium -  Repeat ECG in AM  Thrombocytopenia, likely due to acute illness and rhabdomyolysis -  Trend  Hypokalemia, likely due to poor oral intake for the last  24-48 hours -  Oral repletion  Bilirubin and mild transaminitis without abdominal pain:  Likely reflects dehydration and rhabdomyolysis. -  Repeat in AM  Diet:  Regular Access:  PIV IVF:  Normal saline at 200 ml/hour Proph:  Lovenox  Code Status:  Full code Family Communication:  Spoke to the patient alone Disposition Plan:  Pending improvement in CPK, lactic acid, stable creatinine, and PT evaluation. Anticipate that he will likely need placement.  Time spent:  60 minutes  Renae Fickle [facility] Pager [phone]  If 7PM-7AM, please contact night-coverage [email or web address] Password [facility] 07/04/2013, 2:38 PM

## 2013-07-05 ENCOUNTER — Inpatient Hospital Stay (HOSPITAL_COMMUNITY): Payer: Medicare Other

## 2013-07-05 DIAGNOSIS — Y92009 Unspecified place in unspecified non-institutional (private) residence as the place of occurrence of the external cause: Secondary | ICD-10-CM

## 2013-07-05 DIAGNOSIS — J189 Pneumonia, unspecified organism: Secondary | ICD-10-CM

## 2013-07-05 DIAGNOSIS — W19XXXA Unspecified fall, initial encounter: Secondary | ICD-10-CM

## 2013-07-05 DIAGNOSIS — M6282 Rhabdomyolysis: Principal | ICD-10-CM

## 2013-07-05 DIAGNOSIS — E860 Dehydration: Secondary | ICD-10-CM

## 2013-07-05 LAB — COMPREHENSIVE METABOLIC PANEL
Albumin: 2.5 g/dL — ABNORMAL LOW (ref 3.5–5.2)
BUN: 11 mg/dL (ref 6–23)
Calcium: 8.4 mg/dL (ref 8.4–10.5)
Creatinine, Ser: 0.87 mg/dL (ref 0.50–1.35)
Total Bilirubin: 1.3 mg/dL — ABNORMAL HIGH (ref 0.3–1.2)
Total Protein: 5.9 g/dL — ABNORMAL LOW (ref 6.0–8.3)

## 2013-07-05 LAB — PSA: PSA: 434.1 ng/mL — ABNORMAL HIGH (ref <=4.00)

## 2013-07-05 LAB — CBC
HCT: 31.8 % — ABNORMAL LOW (ref 39.0–52.0)
MCV: 101.9 fL — ABNORMAL HIGH (ref 78.0–100.0)
Platelets: 113 10*3/uL — ABNORMAL LOW (ref 150–400)
RBC: 3.12 MIL/uL — ABNORMAL LOW (ref 4.22–5.81)
WBC: 6.5 10*3/uL (ref 4.0–10.5)

## 2013-07-05 MED ORDER — SODIUM CHLORIDE 0.9 % IV SOLN
INTRAVENOUS | Status: AC
  Administered 2013-07-05 – 2013-07-07 (×5): via INTRAVENOUS

## 2013-07-05 MED ORDER — METOPROLOL TARTRATE 25 MG PO TABS
25.0000 mg | ORAL_TABLET | Freq: Two times a day (BID) | ORAL | Status: DC
  Administered 2013-07-05 – 2013-07-12 (×13): 25 mg via ORAL
  Filled 2013-07-05 (×16): qty 1

## 2013-07-05 MED ORDER — LORAZEPAM 0.5 MG PO TABS
0.5000 mg | ORAL_TABLET | ORAL | Status: DC | PRN
  Administered 2013-07-05 – 2013-07-06 (×5): 0.5 mg via ORAL
  Filled 2013-07-05 (×5): qty 1

## 2013-07-05 MED ORDER — AZITHROMYCIN 250 MG PO TABS
250.0000 mg | ORAL_TABLET | Freq: Every day | ORAL | Status: DC
  Administered 2013-07-06 – 2013-07-09 (×3): 250 mg via ORAL
  Filled 2013-07-05 (×4): qty 1

## 2013-07-05 MED ORDER — AMLODIPINE BESYLATE 5 MG PO TABS
5.0000 mg | ORAL_TABLET | Freq: Every day | ORAL | Status: DC
  Administered 2013-07-05 – 2013-07-12 (×7): 5 mg via ORAL
  Filled 2013-07-05 (×8): qty 1

## 2013-07-05 MED ORDER — ENSURE COMPLETE PO LIQD
237.0000 mL | Freq: Two times a day (BID) | ORAL | Status: DC
  Administered 2013-07-05 – 2013-07-12 (×11): 237 mL via ORAL

## 2013-07-05 MED ORDER — ALUM & MAG HYDROXIDE-SIMETH 200-200-20 MG/5ML PO SUSP: 30.0000 mL | ORAL | Status: DC | PRN

## 2013-07-05 MED ORDER — FUROSEMIDE 10 MG/ML IJ SOLN
40.0000 mg | Freq: Two times a day (BID) | INTRAMUSCULAR | Status: AC
  Administered 2013-07-05 (×2): 40 mg via INTRAVENOUS
  Filled 2013-07-05 (×2): qty 4

## 2013-07-05 MED ORDER — AZITHROMYCIN 500 MG PO TABS
500.0000 mg | ORAL_TABLET | Freq: Every day | ORAL | Status: AC
  Administered 2013-07-05: 500 mg via ORAL
  Filled 2013-07-05: qty 1

## 2013-07-05 NOTE — Clinical Social Work Psychosocial (Signed)
Clinical Social Work Department BRIEF PSYCHOSOCIAL ASSESSMENT 07/05/2013  Patient:  Wesley [name], Wesley [name]     Account Number:  [id number]     Admit date:  07/04/2013  Clinical Social Worker:  Lavell Luster  Date/Time:  07/05/2013 04:00 PM  Referred by:  Physician  Date Referred:  07/05/2013 Referred for  SNF Placement   Other Referral:   Interview type:  Patient Other interview type:    PSYCHOSOCIAL DATA Living Status:  FRIEND(S) Admitted from facility:   Level of care:   Primary support name:  Wesley [name] and Wesley [name] Primary support relationship to patient:  FAMILY Degree of support available:   Patient states that Wesley [name] and Wesley [name] (Patient's cousin and counsin's wife) are supportive and available to help him. Wesley [name] contact: [phone] and Hilda's contact: [phone].    CURRENT CONCERNS Current Concerns  Post-Acute Placement   Other Concerns:    SOCIAL WORK ASSESSMENT / PLAN CSW met with patient to discuss recommendation for SNF placement. Patient was somewhat agitated during CSW visit as he claims he has not had a good experience on the unit. Patient refuses SNF placement and plans to return home with [facility] services. Patient states that his friend Wesley [name] can help him at home. CSW left contact information with patient in case he changes his mind or has more needs/questions in the future.   Assessment/plan status:  No Further Intervention Required Other assessment/ plan:   n/a   Information/referral to community resources:   No resources or information were given to patient as patient was not interested in SNF placement.    PATIENT'S/FAMILY'S RESPONSE TO PLAN OF CARE: Patient refuses SNF placement against PT's recommendation.      Wesley [name], [place], [place], [phone]

## 2013-07-05 NOTE — Progress Notes (Addendum)
Called Md Norins office was wanting to notify MD of patient's recent expression of SOB and Sinus Tach per telemetry monitoring, as well as patient's concern about not having his bp ordered. Left a message but never spoke wit Md Norins, however he did put ing orders for CXR and bp meds. His vital signs were within normal limits. Placed patient on 2 liters of oxygen and SOB resolved. Patient seem to be comfortable and denies complaint at this time

## 2013-07-05 NOTE — Evaluation (Addendum)
Occupational Therapy Evaluation Patient Details Name: Wesley [name] MRN: [id number] DOB: [birth year] Today's Date: 07/05/2013 Time: 4098-1191 OT Time Calculation (min): 36 min  OT Assessment / Plan / Recommendation History of present illness   Pt with history of peripheral vascular disease, elevated PSA/prostatitis, gout, HTN, HLD, throat cancer status post treatment in remission, who presents with fall and inability to get up. The patient was last at their baseline health two days prior to admission. Two nights prior to admission (36 hours ago), the patient had been sleeping in a recliner and attempted to get up. He sustained a mechanical fall without head trauma or loss of consciousness. He felt so weak however if patient was unable to get up and laid on the floor for the next day and a half until found by aid. Admitted now with rhabdomyolysis.    Clinical Impression   Pt admitted s/p fall with subsequent rhabdomyolysis. Pt will benefit from continued acute OT services to address below problem list.  Pt reports he lives alone but goes to the liquor store every day with family/friend.  Pt unsure that he will have reliable 24/7 assist at home.  Pt also reports h/o falls prior to the fall that resulted in his admission to hospital  Will recommend SNF for d/c planning as pt will need 247 assist and to further progress independence and safety with ADLs before return home    OT Assessment  Patient needs continued OT Services    Follow Up Recommendations  SNF;Supervision/Assistance - 24 hour    Barriers to Discharge Decreased caregiver support    Equipment Recommendations  None recommended by OT    Recommendations for Other Services    Frequency  Min 2X/week    Precautions / Restrictions Precautions Precautions: Fall   Pertinent Vitals/Pain See vitals    ADL  Eating/Feeding: Performed;Modified independent Where Assessed - Eating/Feeding: Chair;Bed level Lower Body Dressing:  Performed;Maximal assistance Where Assessed - Lower Body Dressing: Unsupported sitting Toilet Transfer: Performed;Moderate assistance Toilet Transfer Method: Stand pivot Toilet Transfer Equipment: Bedside commode Toileting - Clothing Manipulation and Hygiene: Performed;Minimal assistance Where Assessed - Engineer, mining and Hygiene: Sit to stand from 3-in-1 or toilet Equipment Used: Gait belt;Rolling walker Transfers/Ambulation Related to ADLs: Mod assist with RW to ambulate through room.  Assist for safety with RW due to pt pushing RW too far ahead.  Pt fatigues easily and required seated rest break x1 for ~1 minute. ADL Comments: Pt unable to reach down to feet while sitting EOB in order to pull socks up although states he usually has no difficulty with task.     OT Diagnosis: Generalized weakness;Acute pain;Cognitive deficits  OT Problem List: Decreased strength;Decreased activity tolerance;Impaired balance (sitting and/or standing);Decreased cognition;Decreased safety awareness;Decreased knowledge of use of DME or AE;Pain OT Treatment Interventions: Self-care/ADL training;DME and/or AE instruction;Therapeutic activities;Patient/family education;Balance training;Cognitive remediation/compensation   OT Goals(Current goals can be found in the care plan section) Acute Rehab OT Goals Patient Stated Goal: to return home OT Goal Formulation: With patient Time For Goal Achievement: 07/12/13 Potential to Achieve Goals: Good  Visit Information  Last OT Received On: 07/05/13 Assistance Needed: +2 (for chair follow with ambulation) PT/OT Co-Evaluation/Treatment: Yes       Prior Functioning     Home Living Family/patient expects to be discharged to:: Private residence Available Help at Discharge: Family;Friend(s);Available PRN/intermittently Type of Home: Apartment Home Access: Stairs to enter Entrance Stairs-Number of Steps: 1 Home Layout: One level Home Equipment: Cane  - single point;Bedside  commode;Walker - 2 wheels Prior Function Level of Independence: Needs assistance ADL's / Homemaking Assistance Needed: Pt does not drive, but family/friends take him where he needs to go.  Hires someone to assist with housekeeping. Comments: Pt reports he goes to the liquor store every day with his cousin.  States his "drinking buddy" could probably stay at home with him 24/7. Also reports h/o at least 2 falls prior to the fall that resulted in his admission to hospital. Communication Communication: No difficulties         Vision/Perception Vision - History Baseline Vision: Bifocals   Cognition  Cognition Arousal/Alertness: Awake/alert Behavior During Therapy: WFL for tasks assessed/performed Overall Cognitive Status: Impaired/Different from baseline Area of Impairment: Safety/judgement Safety/Judgement: Decreased awareness of deficits General Comments: Pt able to verbalize that he is weaker than his baseline but states he thinks he could still manage at home for himself.    Extremity/Trunk Assessment Upper Extremity Assessment Upper Extremity Assessment: Generalized weakness;Overall WFL for tasks assessed;LUE deficits/detail LUE Deficits / Details: Pain in shoulder/elbow with movement. AROM WFL.     Mobility Bed Mobility Bed Mobility: Supine to Sit;Sitting - Scoot to Edge of Bed Supine to Sit: 3: Mod assist;HOB elevated;With rails Sitting - Scoot to Edge of Bed: 3: Mod assist Details for Bed Mobility Assistance: Assist to support trunk OOB and to assist in scooting left hip toward EOB. Transfers Transfers: Sit to Stand;Stand to Sit Sit to Stand: 3: Mod assist;From bed;From chair/3-in-1;With upper extremity assist;With armrests Stand to Sit: 4: Min assist;To chair/3-in-1;To bed;With armrests;With upper extremity assist Details for Transfer Assistance: Assist for power up and for steadying with transition from bed/chair to RW.  Also assist to control  descent.     Exercise     Balance Balance Balance Assessed: Yes Static Sitting Balance Static Sitting - Balance Support: No upper extremity supported;Feet supported Static Sitting - Level of Assistance: 5: Stand by assistance Static Standing Balance Static Standing - Balance Support: Bilateral upper extremity supported Static Standing - Level of Assistance: 4: Min assist   End of Session OT - End of Session Equipment Utilized During Treatment: Gait belt;Rolling walker Activity Tolerance: Patient limited by fatigue Patient left: in chair;with call bell/phone within reach Nurse Communication: Mobility status  GO    07/05/2013 Cipriano Mile OTR/L Pager [phone] Office [phone]  Cipriano Mile 07/05/2013, 2:20 PM

## 2013-07-05 NOTE — Progress Notes (Signed)
  Subjective: Patient admitted after a fall and a period of immobility with subsequent rhabdomyolysis. He has no complaint this AM.  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  Stable scarring in the left lower lobe and stable biapical  pleuroparenchymal scarring.  Scheduled Meds: . antiseptic oral rinse  15 mL Mouth Rinse BID  . aspirin EC  81 mg Oral Daily  . cefTRIAXone (ROCEPHIN)  IV  1 g Intravenous Q24H  . doxycycline  100 mg Oral Q12H  . enoxaparin (LOVENOX) injection  40 mg Subcutaneous Q24H  . hydrocerin   Topical BID  . mupirocin ointment   Topical BID  . pneumococcal 23 valent vaccine  0.5 mL Intramuscular Tomorrow-1000  . potassium chloride  40 mEq Oral Once  . sodium chloride  3 mL Intravenous Q12H   Continuous Infusions: . sodium chloride 200 mL/hr at 07/05/13 0315   PRN Meds:.   Physical Exam: Filed Vitals:   07/05/13 0500  BP: 128/75  Pulse: 104  Temp: 98.5 F (36.9 C)  Resp: 18    Intake/Output Summary (Last 24 hours) at 07/05/13 0909 Last data filed at 07/05/13 0903  Gross per 24 hour  Intake 948.75 ml  Output    400 ml  Net 548.75 ml   Gen'l- very thin AA man in no distress Cor- 2+ radial, RRR Pulm - normal breath sounds Abd - soft, Neuro - A&O x 3      Assessment/Plan: 1. Rhabdo - Creatinine has come down. He is tolerating fluids Plan Decrease IVF to 100 xcxc/hr  CK total in AM  2. Falls - patient reports several falls at home Plan  PT eval  3. CAP - per admitting doctor. No  leukocytosis, cough or fever. XCXR with infiltrate. Plan Day #2 rocephin - will continue  D/C doxy  Start Azithromycin   Illene Regulus [place] [facility] (o) [phone]; (c) [phone] Call-grp - [facility]  Tele: [phone]  07/05/2013, 7:57 AM

## 2013-07-05 NOTE — Care Management Note (Addendum)
    Page 1 of 2   07/12/2013     12:10:31 PM   CARE MANAGEMENT NOTE 07/12/2013  Patient:  Wesley [name], Wesley [name]   Account Number:  [id number]  Date Initiated:  07/05/2013  Documentation initiated by:  Letha Cape  Subjective/Objective Assessment:   dx hypertension, rhabdomylosis.  admit- lives alone with caregiver.     Action/Plan:   pt/ot eval-   ot rec snf  pt refusing snf.   Anticipated DC Date:  07/12/2013   Anticipated DC Plan:  HOME W HOME HEALTH SERVICES  In-house referral  Clinical Social Worker      DC Planning Services  CM consult      [facility] Choice  HOME HEALTH   Choice offered to / List presented to:  C-1 Patient   DME arranged  OTHER - SEE COMMENT      DME agency  [facility]     HH arranged  HH-1 RN  HH-2 PT  HH-3 OT  HH-6 SOCIAL WORKER      HH agency  [facility]   Status of service:  Completed, signed off Medicare Important Message given?   (If response is "NO", the following Medicare IM given date fields will be blank) Date Medicare IM given:   Date Additional Medicare IM given:    Discharge Disposition:  HOME W HOME HEALTH SERVICES  Per UR Regulation:  Reviewed for med. necessity/level of care/duration of stay  If discussed at Long Length of Stay Meetings, dates discussed:   07/11/2013    Comments:  07/12/13 10:58 Letha Cape RN, BSN [phone] patient is for dc today, Floor RN, will let me know if patient needs home oxygen.  [facility] notified about dc today. Per floor RN, no oxygen needed.  07/11/13 15:13 Letha Cape RN, BSN [phone] patient for dc tomorrow, will notify [facility].  07/07/13 10:53 Letha Cape RN, BSN [phone] patient is for dc today, [facility] notified ,will add Child psychotherapist.  Per physical therapy patient will need home oxygen. Spoke with cousin,  Satira Anis states he will be with the patient half of the time during the day and his neighbor [name] will be with him the half of the day per [facility].   Patient still refesuing SNF.  Per MD patient is not ready for dc today due to CK still too high,needs to be less than 500.  There are no orders for home oxygen yet, gave Jill Alexanders with [facility] heads up to keep a look out for this.  07/06/13 12:54 Letha Cape RN, BSN [phone] patient is for dc today, patient is refusing snf and would like to go home with hh services.  Patient chose [facility] , referral made to [facility], [facility] notified for [facility], PT, OT . Soc will begin 24-48 hrs post discharge.  Patient states he has medication coverage and transportation at dc. Patient wanted a rollator, this was ordered and Jill Alexanders brought this up to his room.  07/05/13 15:25 Letha Cape RN, BSN [phone] patient lives alone and has a caregiver, per occupational therapy recs SNF, awaiting pt eval. CSW referral.

## 2013-07-05 NOTE — Progress Notes (Signed)
INITIAL NUTRITION ASSESSMENT  DOCUMENTATION CODES Per approved criteria  -Not Applicable   INTERVENTION: 1. Ensure Complete po BID, each supplement provides 350 kcal and 13 grams of protein. ( Strawberry flavor only per pt request)  NUTRITION DIAGNOSIS: Increased nutrient needs related to muscle repletion as evidenced by muscle wasting.   Goal: PO intake to meet >/=90% estimated nutrition needs.   Monitor:  PO intake, weight trends, labs  Reason for Assessment: Malnutrition Screening Tool  79 y.o. male  Admitting Dx: Dehydration   ASSESSMENT: Pt admitted after a mechanical fall at home and being unable to get up for about 36 hrs. Admitted with dehydration and will likely need SNF placement on d/c.   Pt denies any recent weight loss, and weight hx shows weight loss of only 3 lbs in the past 8 months. Pt reports appetite is good. Drinks Ensure at home.   Nutrition Focused Physical Exam:  Subcutaneous Fat:  Orbital Region: mild wasting Upper Arm Region: WNL Thoracic and Lumbar Region: n/a  Muscle:  Temple Region: mild wasting Clavicle Bone Region: mild wasting Clavicle and Acromion Bone Region: mild wasting Scapular Bone Region: n/a Dorsal Hand: n/a Patellar Region: mild wasting  Anterior Thigh Region: severe wasting  Posterior Calf Region: mild wasting   Edema: none     Height: Ht Readings from Last 1 Encounters:  07/04/13 5\' 11"  (1.803 m)    Weight: Wt Readings from Last 1 Encounters:  07/05/13 138 lb (62.596 kg)    Ideal Body Weight: 172 lbs   % Ideal Body Weight: 80%  Wt Readings from Last 10 Encounters:  07/05/13 138 lb (62.596 kg)  11/22/12 141 lb 0.6 oz (63.975 kg)  11/01/12 141 lb 12.8 oz (64.32 kg)  01/25/12 145 lb 2 oz (65.828 kg)  12/02/11 143 lb (64.864 kg)  03/12/11 149 lb (67.586 kg)  12/30/10 148 lb (67.132 kg)  10/21/10 149 lb (67.586 kg)  05/30/09 150 lb (68.04 kg)  07/05/08 161 lb (73.029 kg)    Usual Body Weight: 141 lbs    % Usual Body Weight: 98%  BMI:  Body mass index is 19.26 kg/(m^2).  WNL   Estimated Nutritional Needs: Kcal: 1500-1700 Protein: 65-75 gm  Fluid: 1.5-1.7 L   Skin: intact   Diet Order: General  EDUCATION NEEDS: -No education needs identified at this time   Intake/Output Summary (Last 24 hours) at 07/05/13 1148 Last data filed at 07/05/13 0903  Gross per 24 hour  Intake 948.75 ml  Output    400 ml  Net 548.75 ml    Last BM: PTA    Labs:   Recent Labs Lab 07/04/13 1121 07/04/13 2118 07/05/13 0610  NA 135  --  137  K 3.2*  --  3.3*  CL 94*  --  98  CO2 21  --  25  BUN 13  --  11  CREATININE 1.03  --  0.87  CALCIUM 8.8  --  8.4  MG  --  1.4*  --   GLUCOSE 126*  --  91    CBG (last 3)  No results found for this basename: GLUCAP,  in the last 72 hours  Scheduled Meds: . amLODipine  5 mg Oral Daily  . antiseptic oral rinse  15 mL Mouth Rinse BID  . aspirin EC  81 mg Oral Daily  . [START ON 07/06/2013] azithromycin  250 mg Oral Daily  . cefTRIAXone (ROCEPHIN)  IV  1 g Intravenous Q24H  . enoxaparin (LOVENOX) injection  40 mg Subcutaneous Q24H  . furosemide  40 mg Intravenous Q12H  . hydrocerin   Topical BID  . metoprolol tartrate  25 mg Oral BID  . mupirocin ointment   Topical BID  . pneumococcal 23 valent vaccine  0.5 mL Intramuscular Tomorrow-1000  . potassium chloride  40 mEq Oral Once  . sodium chloride  3 mL Intravenous Q12H    Continuous Infusions: . sodium chloride 100 mL/hr at 07/05/13 1035    Past Medical History  Diagnosis Date  . Osteoarthrosis, unspecified whether generalized or localized, other specified sites   . Acute kidney failure with other specified pathological lesion in kidney   . Prostatitis   . Postsurgical percutaneous transluminal coronary angioplasty status     weintraub  . Cataract extraction status `  . Peripheral vascular disease, unspecified   . Personal history of malignant neoplasm of larynx   . Elevated prostate  specific antigen (PSA)   . Gout, unspecified   . Acute peptic ulcer with hemorrhage   . Unspecified essential hypertension   . Hyperlipidemia     Past Surgical History  Procedure Laterality Date  . Percutaneous translumional coronary angioplastty      stents to bilateral legs for PVD  . Cataract extraction    . Truncal vagotomy  06/2005  . Gastrojejunosotomy  06/2005  . Cholecystectomy  06/2005    Clarene Duke RD, LDN Pager [phone] After Hours pager [phone]

## 2013-07-05 NOTE — Evaluation (Signed)
Physical Therapy Evaluation Patient Details Name: Wesley [name] MRN: [id number] DOB: 09-29-1920 Today's Date: 07/05/2013 Time: 1478-2956 PT Time Calculation (min): 36 min  PT Assessment / Plan / Recommendation History of Present Illness  Pt is a 79 y/o male admitted after a fall with inability to get up. Per chart review, pt was on the floor for a day and a half before his aide found him and called 911. Pt subsequently with rhabdomyolysis as a result of dehydration.  Clinical Impression  Pt advised of physical therapy's recommendation for short term rehab prior to returning home alone. Although pt does not appear interested in discharging to a skilled nursing facility, safety would be a major concern for him to be home alone. At the time of PT evaluation, pt required mod assist +1 to +2 for all functional mobility and ambulation. He was able to clean himself up after BM on [facility], however transferring from chair to/from [facility] required +2 assist. This patient would benefit from further skilled PT interventions to address functional limitations, improve safety and independence with functional mobility, and return to PLOF.    PT Assessment  Patient needs continued PT services    Follow Up Recommendations  SNF    Does the patient have the potential to tolerate intense rehabilitation      Barriers to Discharge Decreased caregiver support Pt reports he has a "drinking buddy" that can come help him at d/c, however was not able to elaborate. Unclear how much help he will actually have at home.     Equipment Recommendations       Recommendations for Other Services     Frequency Min 3X/week    Precautions / Restrictions Precautions Precautions: Fall Restrictions Weight Bearing Restrictions: No   Pertinent Vitals/Pain Pt reports unrated pain in R LE during bed mobility, however does not mention it again during the session. While sitting in recliner at end of session, pt reports that he "just doesn't  feel good" and is SOB. OT notified nursing, and pt was left sitting in chair with LE's elevated and needs within reach.      Mobility  Bed Mobility Bed Mobility: Supine to Sit;Sitting - Scoot to Edge of Bed Supine to Sit: 3: Mod assist;1: +2 Total assist;With rails;HOB elevated Supine to Sit: Patient Percentage: 70% Sitting - Scoot to Edge of Bed: 3: Mod assist;1: +2 Total assist Sitting - Scoot to Edge of Bed: Patient Percentage: 60% Details for Bed Mobility Assistance: Mod assist to advance LE's and support trunk during transfer to EOB.  Transfers Transfers: Sit to Stand;Stand to Sit Sit to Stand: 3: Mod assist;From bed;From chair/3-in-1;With upper extremity assist Stand to Sit: 4: Min guard;With armrests;To chair/3-in-1 Details for Transfer Assistance: Pt required assist for initiating transfer to standing. Even with UE support, pt did not demonstrate the power needed to stand up without assist.  Ambulation/Gait Ambulation/Gait Assistance: 3: Mod assist Ambulation Distance (Feet): 30 Feet Assistive device: Rolling walker Ambulation/Gait Assistance Details: Pt required assist to position walker and manage equipment. As gait training advanced, pt pushed the walker farther and farther out in front of him and was unable to correct with cueing. Pt took one seated rest break for 2 minutes before returning to chair.. Gait Pattern: Step-through pattern;Decreased stride length;Trunk flexed;Narrow base of support;Shuffle Gait velocity: decreased Stairs: No    Exercises     PT Diagnosis: Difficulty walking  PT Problem List: Decreased strength;Decreased activity tolerance;Decreased balance;Decreased mobility;Decreased coordination;Decreased knowledge of use of DME;Decreased safety awareness;Pain PT  Treatment Interventions: DME instruction;Gait training;Functional mobility training;Therapeutic activities;Therapeutic exercise;Balance training;Patient/family education     PT Goals(Current  goals can be found in the care plan section) Acute Rehab PT Goals Patient Stated Goal: to return home PT Goal Formulation: With patient Time For Goal Achievement: 07/12/13 Potential to Achieve Goals: Fair  Visit Information  Last PT Received On: 07/05/13 Assistance Needed: +2 (Chair follow with ambulation) PT/OT Co-Evaluation/Treatment: Yes History of Present Illness: Pt is a 79 y/o male admitted after a fall with inability to get up. Per chart review, pt was on the floor for a day and a half before his aide found him and called 911. Pt subsequently with rhabdomyolysis as a result of dehydration.       Prior Functioning  Home Living Family/patient expects to be discharged to:: Private residence Living Arrangements: Alone Available Help at Discharge: Friend(s);Available PRN/intermittently Type of Home: Apartment Home Access: Stairs to enter Entrance Stairs-Number of Steps: 1 Home Layout: One level Home Equipment: Walker - 2 wheels;Cane - single point;Bedside commode Prior Function Level of Independence: Independent with assistive device(s) ADL's / Homemaking Assistance Needed: Pt does not drive, but family/friends take him where he needs to go.  Hires someone to assist with housekeeping. Comments: Pt reports he goes to the liquor store every day with his cousin.  States his "drinking buddy" could probably stay at home with him 24/7. Also reports h/o at least 2 falls prior to the fall that resulted in his admission to hospital. Communication Communication: No difficulties    Cognition  Cognition Arousal/Alertness: Awake/alert Behavior During Therapy: WFL for tasks assessed/performed Overall Cognitive Status: Impaired/Different from baseline Area of Impairment: Safety/judgement Safety/Judgement: Decreased awareness of deficits General Comments: Pt able to verbalize that he is weaker than his baseline but states he thinks he could still manage at home for himself.     Extremity/Trunk Assessment Upper Extremity Assessment Upper Extremity Assessment: Defer to OT evaluation LUE Deficits / Details: Pain in shoulder/elbow with movement. AROM WFL. Lower Extremity Assessment Lower Extremity Assessment: Generalized weakness Cervical / Trunk Assessment Cervical / Trunk Assessment: Kyphotic   Balance Balance Balance Assessed: Yes Static Sitting Balance Static Sitting - Balance Support: No upper extremity supported;Feet supported Static Sitting - Level of Assistance: 5: Stand by assistance Static Sitting - Comment/# of Minutes: 2 Static Standing Balance Static Standing - Balance Support: Bilateral upper extremity supported Static Standing - Level of Assistance: 4: Min assist  End of Session PT - End of Session Equipment Utilized During Treatment: Gait belt Activity Tolerance: Patient limited by fatigue;Patient limited by lethargy Patient left: in chair;with call bell/phone within reach Nurse Communication: Mobility status  GP     Ruthann Cancer 07/05/2013, 3:32 PM  Ruthann Cancer, PT Acute Rehabilitation Services

## 2013-07-06 DIAGNOSIS — I10 Essential (primary) hypertension: Secondary | ICD-10-CM

## 2013-07-06 LAB — BASIC METABOLIC PANEL
BUN: 9 mg/dL (ref 6–23)
Chloride: 95 mEq/L — ABNORMAL LOW (ref 96–112)
GFR calc Af Amer: 83 mL/min — ABNORMAL LOW (ref >90)
GFR calc non Af Amer: 72 mL/min — ABNORMAL LOW (ref >90)
Potassium: 2.4 mEq/L — CL (ref 3.5–5.1)
Sodium: 136 mEq/L (ref 135–145)

## 2013-07-06 MED ORDER — CEFUROXIME AXETIL 250 MG PO TABS
250.0000 mg | ORAL_TABLET | Freq: Two times a day (BID) | ORAL | Status: DC
  Administered 2013-07-06 – 2013-07-09 (×7): 250 mg via ORAL
  Filled 2013-07-06 (×12): qty 1

## 2013-07-06 MED ORDER — POTASSIUM CHLORIDE CRYS ER 20 MEQ PO TBCR
40.0000 meq | EXTENDED_RELEASE_TABLET | Freq: Three times a day (TID) | ORAL | Status: AC
  Administered 2013-07-06 (×3): 40 meq via ORAL
  Filled 2013-07-06 (×2): qty 2

## 2013-07-06 MED ORDER — FUROSEMIDE 20 MG PO TABS
20.0000 mg | ORAL_TABLET | Freq: Two times a day (BID) | ORAL | Status: DC
  Administered 2013-07-06 – 2013-07-07 (×4): 20 mg via ORAL
  Filled 2013-07-06 (×8): qty 1

## 2013-07-06 MED ORDER — POTASSIUM CHLORIDE CRYS ER 20 MEQ PO TBCR: 40.0000 meq | EXTENDED_RELEASE_TABLET | Freq: Three times a day (TID) | ORAL | Status: DC

## 2013-07-06 NOTE — Clinical Social Work Note (Signed)
CSW met with patient to notify him that patient's grandson Iantha Fallen [phone] is requesting information from CSW. Patient states that he would like CSW to update grandson. CSW spoke with grandson who is concerned about patient returning home alone. CSW explained that patient has the right to refuse SNF placement. CSW assured patient's grandson that if the patient decides to go to SNF, that this process can be facilitated by social worker with [facility] agency. Tiburcio Bash stated that patient thinks he will stay with grandson, but patient denies this. CSW will continue to follow until DC.   Roddie Mc, [place], [place], [phone]

## 2013-07-06 NOTE — Progress Notes (Signed)
Subjective: Reviewed all notes: PT recommends SNF. Patient has met with CSW and declines SNF placment and intends on returning home at d/c. He did experience SOB yesterday relieved with oxygen. CXR ordered.  Wesley [name] has no complaints except he wants to go home.    Objective: Lab:  Recent Labs  07/04/13 1121 07/05/13 0610  WBC 6.0 6.5  NEUTROABS 5.5  --   HGB 13.6 11.4*  HCT 38.5* 31.8*  MCV 103.2* 101.9*  PLT 128* 113*    Recent Labs  07/04/13 1121 07/04/13 2118 07/05/13 0610  NA 135  --  137  K 3.2*  --  3.3*  CL 94*  --  98  GLUCOSE 126*  --  91  BUN 13  --  11  CREATININE 1.03  --  0.87  CALCIUM 8.8  --  8.4  MG  --  1.4*  --    CK 8/13  4465, Trop I <0.30  Lactic acid 2.5   Imaging: no new imaging  Scheduled Meds: . amLODipine  5 mg Oral Daily  . antiseptic oral rinse  15 mL Mouth Rinse BID  . aspirin EC  81 mg Oral Daily  . azithromycin  250 mg Oral Daily  . cefTRIAXone (ROCEPHIN)  IV  1 g Intravenous Q24H  . enoxaparin (LOVENOX) injection  40 mg Subcutaneous Q24H  . feeding supplement  237 mL Oral BID BM  . hydrocerin   Topical BID  . metoprolol tartrate  25 mg Oral BID  . mupirocin ointment   Topical BID  . pneumococcal 23 valent vaccine  0.5 mL Intramuscular Tomorrow-1000  . potassium chloride  40 mEq Oral Once  . sodium chloride  3 mL Intravenous Q12H   Continuous Infusions: . sodium chloride 100 mL/hr at 07/06/13 0418   PRN Meds:.alum & mag hydroxide-simeth, LORazepam   Physical Exam: Filed Vitals:   07/06/13 0606  BP: 127/81  Pulse: 94  Temp: 98.7 F (37.1 C)  Resp: 18    Intake/Output Summary (Last 24 hours) at 07/06/13 1610 Last data filed at 07/06/13 9604  Gross per 24 hour  Intake 1578.33 ml  Output   3000 ml  Net -1421.67 ml   Total this adm: -992  (unclear about IV intake: rec'd 200 cc hr from adm to morning of 8/13 then 100 cc/hr until now - should be at least 2400-3,000cc IV total for this adm?) will check  weight  Gen'l- very thin elderly AA man in no distress Cor - RRR PUlm - no increased WOB, no rales, decreased BS left base Abd- soft Neuro - A&O x 3.      Assessment/Plan: 1. Rhabdo - has been vigorously hydrated since admission (I/O acurate?) and due to possible excessive fluid was given two doses IV lasix with excellent output.  Plan Decrease IV to 75 cc/hr  F/u CK in AM, f/u Bmet  2. Falls - PT has seen patient. He will need HH PT/OT  F-F form completed  3. CAP - continued abn CXR. Day #3 rocephin - will change to ceftin po, Day # azithromycin  Dispo - anticipate d/c in AM if CK is down.   Wesley [name] (o) [phone]; (c) [phone] Call-grp - Wesley [name]  Tele: [phone]  07/06/2013, 7:02 AM

## 2013-07-06 NOTE — Progress Notes (Signed)
CRITICAL VALUE ALERT  Critical value received:  K 2.4  Date of notification:  07/06/2013  Time of notification:  1150(2nd time today)  Critical value read back:yes  Nurse who received alert:  Kennyth Arnold   MD notified (1st page):  1150 (2nd time)  Time of first page:  1152  MD notified (2nd page):  Time of second page:  Responding MD: Dr. Debby Bud  Time MD responded:  [phone]

## 2013-07-07 LAB — BASIC METABOLIC PANEL
Calcium: 8.6 mg/dL (ref 8.4–10.5)
Chloride: 98 mEq/L (ref 96–112)
Creatinine, Ser: 1.06 mg/dL (ref 0.50–1.35)
GFR calc Af Amer: 69 mL/min — ABNORMAL LOW (ref >90)
Sodium: 137 mEq/L (ref 135–145)

## 2013-07-07 LAB — CBC WITH DIFFERENTIAL/PLATELET
Basophils Absolute: 0 10*3/uL (ref 0.0–0.1)
HCT: 33.8 % — ABNORMAL LOW (ref 39.0–52.0)
Lymphocytes Relative: 5 % — ABNORMAL LOW (ref 12–46)
Lymphs Abs: 0.4 10*3/uL — ABNORMAL LOW (ref 0.7–4.0)
Neutro Abs: 8.2 10*3/uL — ABNORMAL HIGH (ref 1.7–7.7)
Platelets: 150 10*3/uL (ref 150–400)
RBC: 3.29 MIL/uL — ABNORMAL LOW (ref 4.22–5.81)
RDW: 13.4 % (ref 11.5–15.5)
WBC: 9 10*3/uL (ref 4.0–10.5)

## 2013-07-07 LAB — CK: Total CK: 1745 U/L — ABNORMAL HIGH (ref 7–232)

## 2013-07-07 MED ORDER — ALUM & MAG HYDROXIDE-SIMETH 200-200-20 MG/5ML PO SUSP
ORAL | Status: AC
  Administered 2013-07-07: 30 mL via ORAL
  Filled 2013-07-07: qty 30

## 2013-07-07 MED ORDER — POTASSIUM CHLORIDE CRYS ER 20 MEQ PO TBCR
40.0000 meq | EXTENDED_RELEASE_TABLET | Freq: Three times a day (TID) | ORAL | Status: AC
  Administered 2013-07-07 (×2): 40 meq via ORAL
  Filled 2013-07-07 (×2): qty 2

## 2013-07-07 MED ORDER — LORAZEPAM 0.5 MG PO TABS
0.5000 mg | ORAL_TABLET | ORAL | Status: DC | PRN
  Administered 2013-07-07 – 2013-07-10 (×4): 0.5 mg via ORAL
  Filled 2013-07-07 (×4): qty 1

## 2013-07-07 MED ORDER — POTASSIUM CHLORIDE CRYS ER 20 MEQ PO TBCR
EXTENDED_RELEASE_TABLET | ORAL | Status: AC
  Administered 2013-07-07: 40 meq via ORAL
  Filled 2013-07-07: qty 2

## 2013-07-07 MED ORDER — ALUM & MAG HYDROXIDE-SIMETH 200-200-20 MG/5ML PO SUSP
30.0000 mL | ORAL | Status: DC | PRN
  Administered 2013-07-10: 30 mL via ORAL
  Filled 2013-07-07: qty 30

## 2013-07-07 NOTE — Progress Notes (Signed)
Occupational Therapy Treatment Patient Details Name: Wesley [name] MRN: [id number] DOB: 1919-11-24 Today's Date: 07/07/2013 Time: 4132-4401 OT Time Calculation (min): 34 min  OT Assessment / Plan / Recommendation  History of present illness Pt is a 79 y/o male admitted after a fall with inability to get up. Per chart review, pt was on the floor for a day and a half before his aide found him and called 911. Pt subsequently with rhabdomyolysis as a result of dehydration.    OT comments  Pt not making progress, really needs SNF for continued therapies. Not currently safe with rollator (per CM note pt was the one that requested it). Pt has really low endurance.  Follow Up Recommendations  SNF       Equipment Recommendations  None recommended by OT       Frequency Min 2X/week   Progress towards OT Goals Progress towards OT goals: Not progressing toward goals - comment (due to decreased endurance/fatigue with little activity)  Plan Discharge plan remains appropriate    Precautions / Restrictions Precautions Precautions: Fall Restrictions Weight Bearing Restrictions: No   Pertinent Vitals/Pain 10/10 pain in legs (RN made aware). Sats on RA hard to get--initially got 75-77 with not good wave form, but then when we put him on O2 (2 liters) he did eventually come up to 100 (again not good wave form). Hands cold, ears cold, and could not get a reading with monitor on his forehead.    ADL  Toilet Transfer: Simulated;Minimal assistance Toilet Transfer Method: Sit to Barista:  (Bed>out door>sit on seat of rollator) Equipment Used: Gait belt (rollator) Transfers/Ambulation Related to ADLs: Min guard A for all except min A for sit to stand from rollator ADL Comments: Plan was to attempt grooming task at sink, walk, then back to use bathroom; however because of pt's decreased endurance/ drop in O2 sats all task could not be completed.     OT Goals(current goals can now be  found in the care plan section) Acute Rehab OT Goals Patient Stated Goal: to return home  Visit Information  Last OT Received On: 07/07/13 Assistance Needed: +2 PT/OT Co-Evaluation/Treatment: Yes History of Present Illness: Pt is a 79 y/o male admitted after a fall with inability to get up. Per chart review, pt was on the floor for a day and a half before his aide found him and called 911. Pt subsequently with rhabdomyolysis as a result of dehydration.           Cognition  Cognition Arousal/Alertness: Awake/alert Behavior During Therapy: WFL for tasks assessed/performed Overall Cognitive Status: Impaired/Different from baseline Area of Impairment: Safety/judgement Safety/Judgement: Decreased awareness of safety;Decreased awareness of deficits General Comments: Pt refusing SNF and feels he is able to manage at home with friend as caregiver--I would disagree--pt has very poor endurance and will need alot of A post D/C.    Mobility  Bed Mobility Bed Mobility: Supine to Sit;Sitting - Scoot to Edge of Bed Supine to Sit: 4: Min assist;HOB elevated;With rails Sitting - Scoot to Edge of Bed: 4: Min assist;With rail (and use of bed pad to advance pt to EOB.) Details for Bed Mobility Assistance: HHA for pt to pull up on, with other hand on bedrail for support. Bed pad used to advance pt to EOB to complete transfer. Transfers Sit to Stand: 4: Min guard;From bed;With upper extremity assist Stand to Sit: 4: Min guard;To chair/3-in-1;With armrests Details for Transfer Assistance: Pt required min assist for sit<>stand from  rollator, to keep it from sliding out from under him.        Balance Balance Balance Assessed: Yes Static Sitting Balance Static Sitting - Balance Support: No upper extremity supported;Feet supported Static Sitting - Level of Assistance: 5: Stand by assistance Static Sitting - Comment/# of Minutes: 2   End of Session OT - End of Session Equipment Utilized During  Treatment: Gait belt;Rolling walker;Oxygen Activity Tolerance: Patient limited by fatigue Patient left: in chair;with call bell/phone within reach;with family/visitor present Nurse Communication:  (O2 sats dropped with ambulation)       Evette Georges [phone] 07/07/2013, 10:44 AM

## 2013-07-07 NOTE — Progress Notes (Signed)
Physical Therapy Treatment Patient Details Name: Wesley [name] MRN: [id number] DOB: 12-19-1919 Today's Date: 07/07/2013 Time: 7829-5621 PT Time Calculation (min): 34 min  PT Assessment / Plan / Recommendation  History of Present Illness Pt is a 79 y/o male admitted after a fall with inability to get up. Per chart review, pt was on the floor for a day and a half before his aide found him and called 911. Pt subsequently with rhabdomyolysis as a result of dehydration.   PT Comments   Pt making slow progress towards physical therapy goals. Pt able to perform transfer to EOB with less assist than previous session, however continues to require increased time to complete tasks. Pt ambulated 25 feet with significant drop in O2 saturation on room air. Pt sat ~10 minutes with PT, OT and RN present to monitor O2 saturation, and 2L/min supplemental O2 was donned. At this time O2 saturation increased from 76-99% over a 5 minute time period with cueing for pursed-lip breathing. Pt's cousin present at time of session, and reports that he feels caregiver burden will be too much if pt returns home, however pt continues to refuse SNF placement. Therapist shared this information with care management.  Follow Up Recommendations  SNF     Does the patient have the potential to tolerate intense rehabilitation     Barriers to Discharge        Equipment Recommendations  None recommended by PT    Recommendations for Other Services    Frequency Min 3X/week   Progress towards PT Goals Progress towards PT goals: Progressing toward goals (slowly)  Plan Current plan remains appropriate    Precautions / Restrictions Precautions Precautions: Fall Restrictions Weight Bearing Restrictions: No   Pertinent Vitals/Pain 10/10 pain in LE's reportedly from fall. No further detail given by patient. O2 saturation as described above; pt put on 2L/min supplemental O2 during session.    Mobility  Bed Mobility Bed Mobility:  Supine to Sit;Sitting - Scoot to Edge of Bed Supine to Sit: 4: Min assist;HOB elevated;With rails Sitting - Scoot to Edge of Bed: 4: Min assist;With rail (and use of bed pad to advance pt to EOB.) Details for Bed Mobility Assistance: HHA for pt to pull up on, with other hand on bedrail for support. Bed pad used to advance pt to EOB to complete transfer. Transfers Transfers: Sit to Stand;Stand to Sit Sit to Stand: 4: Min guard;From bed;With upper extremity assist Stand to Sit: 4: Min guard;To chair/3-in-1;With armrests Details for Transfer Assistance: Pt required min assist for sit<>stand from rollator, to keep it from sliding out from under him.  Ambulation/Gait Ambulation/Gait Assistance: 4: Min assist Ambulation Distance (Feet): 25 Feet Assistive device: Rollator Ambulation/Gait Assistance Details: Pt required min assist occasionally to reposition walker, and frequent cueing for feet placement between wheels to avoid tripping on wheels. Pt also cued for pursed-lip breathing. Gait Pattern: Step-through pattern;Decreased stride length;Decreased dorsiflexion - left;Decreased dorsiflexion - right;Narrow base of support Gait velocity: decreased Stairs: No    Exercises     PT Diagnosis:    PT Problem List:   PT Treatment Interventions:     PT Goals (current goals can now be found in the care plan section) Acute Rehab PT Goals Patient Stated Goal: to return home PT Goal Formulation: With patient Time For Goal Achievement: 07/12/13 Potential to Achieve Goals: Fair  Visit Information  Last PT Received On: 07/07/13 Assistance Needed: +2 PT/OT Co-Evaluation/Treatment: Yes History of Present Illness: Pt is a 79 y/o male  admitted after a fall with inability to get up. Per chart review, pt was on the floor for a day and a half before his aide found him and called 911. Pt subsequently with rhabdomyolysis as a result of dehydration.    Subjective Data  Subjective: "I don't feel like doing no  walkin' right now." Patient Stated Goal: to return home   Cognition  Cognition Arousal/Alertness: Awake/alert Behavior During Therapy: WFL for tasks assessed/performed Overall Cognitive Status: Impaired/Different from baseline Area of Impairment: Safety/judgement Safety/Judgement: Decreased awareness of deficits General Comments: Pt refusing SNF and feels he is able to manage at home with friend as caregiver.    Balance  Balance Balance Assessed: Yes Static Sitting Balance Static Sitting - Balance Support: No upper extremity supported;Feet supported Static Sitting - Level of Assistance: 5: Stand by assistance Static Sitting - Comment/# of Minutes: 2  End of Session PT - End of Session Equipment Utilized During Treatment: Gait belt;Oxygen Activity Tolerance: Patient limited by fatigue;Patient limited by lethargy Patient left: in chair;with call bell/phone within reach;with family/visitor present Nurse Communication: Mobility status;Other (comment) (O2 saturation)   GP     Ruthann Cancer 07/07/2013, 10:33 AM  Ruthann Cancer, PT Acute Rehabilitation Services

## 2013-07-07 NOTE — Plan of Care (Signed)
Problem: Acute Rehab PT Goals Goal: Pt/caregiver will Perform Home Exercise Program Outcome: Not Progressing HEP has not been initiated due to pt fatigue after ambulation each session.

## 2013-07-07 NOTE — Progress Notes (Signed)
Subjective: Mr. Jallow has no complaints, but is disappointed that he will need to stay.   Objective: Lab:  Recent Labs  07/04/13 1121 07/05/13 0610 07/07/13 0620  WBC 6.0 6.5 9.0  NEUTROABS 5.5  --  8.2*  HGB 13.6 11.4* 11.9*  HCT 38.5* 31.8* 33.8*  MCV 103.2* 101.9* 102.7*  PLT 128* 113* 150    Recent Labs  07/04/13 2118 07/05/13 0610 07/06/13 0932 07/07/13 0620  NA  --  137 136 137  K  --  3.3* 2.4* 3.1*  CL  --  98 95* 98  GLUCOSE  --  91 101* 123*  BUN  --  11 9 9   CREATININE  --  0.87 0.92 1.06  CALCIUM  --  8.4 8.2* 8.6  MG 1.4*  --   --   --    CK 1745   Imaging: CXR 8/13: IMPRESSION:  1. Persistent opacity at the right lung base, possibly due to  pneumonia. Follow-up to clearing is recommended, as clinically  indicated.  2. Emphysema with left basilar scarring.   Scheduled Meds: . amLODipine  5 mg Oral Daily  . antiseptic oral rinse  15 mL Mouth Rinse BID  . aspirin EC  81 mg Oral Daily  . azithromycin  250 mg Oral Daily  . cefUROXime  250 mg Oral BID WC  . enoxaparin (LOVENOX) injection  40 mg Subcutaneous Q24H  . feeding supplement  237 mL Oral BID BM  . furosemide  20 mg Oral BID  . hydrocerin   Topical BID  . metoprolol tartrate  25 mg Oral BID  . mupirocin ointment   Topical BID  . potassium chloride  40 mEq Oral Once  . sodium chloride  3 mL Intravenous Q12H   Continuous Infusions: . sodium chloride 75 mL/hr at 07/07/13 0601   PRN Meds:.alum & mag hydroxide-simeth, LORazepam   Physical Exam: Filed Vitals:   07/07/13 0447  BP: 155/96  Pulse: 97  Temp: 97.6 F (36.4 C)  Resp: 18  gen'l - very thin elderly white man in no distress Cor- 2+ radial, RRR Pulm - good breath sounds, no rales or wheezes Abd- soft Neuro - A&O x 3      Assessment/Plan: 1. Rhabdo - CK still too high to stop IVF Plan Continue IVF at 75/hr.  CK in AM - needs to be <500 to be discharged  2. Falls - no change. F-F form done for HH-PT  3. Cap - #  3/3 rocephin, #1 ceftin, # 3 azithromycin  Dispo - home when CK is down.    Illene Regulus [place] [facility] (o) [phone]; (c) [phone] Call-grp - [facility]  Tele: [phone]  07/07/2013, 8:51 AM

## 2013-07-08 LAB — CK: Total CK: 753 U/L — ABNORMAL HIGH (ref 7–232)

## 2013-07-08 LAB — BASIC METABOLIC PANEL
Chloride: 100 mEq/L (ref 96–112)
GFR calc Af Amer: 34 mL/min — ABNORMAL LOW (ref >90)
Potassium: 5.1 mEq/L (ref 3.5–5.1)

## 2013-07-08 MED ORDER — SODIUM CHLORIDE 0.9 % IV SOLN
INTRAVENOUS | Status: DC
  Administered 2013-07-08: 100 mL/h via INTRAVENOUS
  Administered 2013-07-08: 20:00:00 via INTRAVENOUS
  Administered 2013-07-09: 1000 mL via INTRAVENOUS
  Administered 2013-07-09: 06:00:00 via INTRAVENOUS

## 2013-07-08 MED ORDER — ENOXAPARIN SODIUM 30 MG/0.3ML ~~LOC~~ SOLN
30.0000 mg | SUBCUTANEOUS | Status: DC
  Administered 2013-07-08 – 2013-07-11 (×4): 30 mg via SUBCUTANEOUS
  Filled 2013-07-08 (×5): qty 0.3

## 2013-07-08 NOTE — Progress Notes (Signed)
Patient states desire to go home. Ativan given PRN for anxiety/insomnia. Repositioned q2h. Redressed dressing on right leg with bactroban and a nonadherent dressing.

## 2013-07-08 NOTE — Progress Notes (Signed)
PT Cancellation Note  Patient Details Name: Wesley [name] MRN: [id number] DOB: 21-Apr-1920   Cancelled Treatment:    Reason Eval/Treat Not Completed: Patient declined, no reason specified.  Pt refusing to get up reporting he is cold (shivering visibly).  Pt states, "I will walk when I get home" and "there is a [place] lady who will take care of me."  Pt adamantly refusing to get up OOB today despite education and encouragement about how much weaker he is getting staying in the bed.   Rollene Rotunda Mimi Debellis, PT, DPT [phone]   07/08/2013, 1:21 PM

## 2013-07-08 NOTE — Progress Notes (Signed)
Subjective: No complaints.  Per rn did vomit yesteday  Objective: Vital signs in last 24 hours: Temp:  [97.9 F (36.6 C)-98.5 F (36.9 C)] 97.9 F (36.6 C) (08/15 2110) Pulse Rate:  [98-121] 104 (08/15 2110) Resp:  [20] 20 (08/15 2110) BP: (106-152)/(61-92) 152/92 mmHg (08/15 2110) SpO2:  [98 %-100 %] 100 % (08/15 2110) Weight:  [66 kg (145 lb 8.1 oz)] 66 kg (145 lb 8.1 oz) (08/16 0500) Weight change: 3.9 kg (8 lb 9.6 oz) Last BM Date: 07/07/13  Intake/Output from previous day: 08/15 0701 - 08/16 0700 In: 3 [I.V.:3] Out: 1350 [Urine:1350] Intake/Output this shift:    General appearance: alert and cooperative Resp: clear to auscultation bilaterally Cardio: regular rate and rhythm, S1, S2 normal, no murmur, click, rub or gallop Extremities: extremities normal, atraumatic, no cyanosis or edema  Lab Results:  Recent Labs  07/07/13 0620  WBC 9.0  HGB 11.9*  HCT 33.8*  PLT 150   BMET  Recent Labs  07/07/13 0620 07/08/13 0715  NA 137 137  K 3.1* 5.1  CL 98 100  CO2 24 25  GLUCOSE 123* 113*  BUN 9 14  CREATININE 1.06 1.90*  CALCIUM 8.6 9.4    Studies/Results: No results found.  Medications: I have reviewed the patient's current medications.  Assessment/Plan: . Rhabdo - has been vigorously hydrated since admission (I/O acurate?) and due to possible excessive fluid was given IV lasix, creatinine up to 1.9.  Hold lasix and give normal saline at 100 cc/hr. F/u CK in AM, f/u Bmet  2. Falls - PT has seen patient. He will need HH PT/OT F-F form completed  3. CAP - continued abn CXR.  will changed to ceftin po, continue azithromycin   Dispo  take foley out, discharge when creatinine dropping, Ck<500, ambulate today    LOS: 4 days   Collene Massimino JOSEPH 07/08/2013, 9:28 AM

## 2013-07-09 ENCOUNTER — Inpatient Hospital Stay (HOSPITAL_COMMUNITY): Payer: Medicare Other

## 2013-07-09 LAB — BASIC METABOLIC PANEL
Chloride: 100 mEq/L (ref 96–112)
GFR calc Af Amer: 22 mL/min — ABNORMAL LOW (ref >90)
Potassium: 4.6 mEq/L (ref 3.5–5.1)
Sodium: 136 mEq/L (ref 135–145)

## 2013-07-09 LAB — URINALYSIS, ROUTINE W REFLEX MICROSCOPIC
Bilirubin Urine: NEGATIVE
Nitrite: NEGATIVE
Specific Gravity, Urine: 1.006 (ref 1.005–1.030)
Urobilinogen, UA: 0.2 mg/dL (ref 0.0–1.0)

## 2013-07-09 LAB — SODIUM, URINE, RANDOM: Sodium, Ur: 90 mEq/L

## 2013-07-09 LAB — URINE MICROSCOPIC-ADD ON

## 2013-07-09 LAB — CK: Total CK: 320 U/L — ABNORMAL HIGH (ref 7–232)

## 2013-07-09 MED ORDER — ALBUTEROL SULFATE (5 MG/ML) 0.5% IN NEBU
INHALATION_SOLUTION | RESPIRATORY_TRACT | Status: AC
  Administered 2013-07-09: 2.5 mg
  Filled 2013-07-09: qty 0.5

## 2013-07-09 MED ORDER — ALBUTEROL SULFATE (5 MG/ML) 0.5% IN NEBU: 2.5000 mg | INHALATION_SOLUTION | RESPIRATORY_TRACT | Status: DC | PRN

## 2013-07-09 NOTE — Evaluation (Signed)
Clinical/Bedside Swallow Evaluation Patient Details  Name: Wesley [name] MRN: [id number] Date of Birth: 10/26/1920  Today's Date: 07/09/2013 Time: 8295-6213 SLP Time Calculation (min): 30 min  Past Medical History:  Past Medical History  Diagnosis Date  . Osteoarthrosis, unspecified whether generalized or localized, other specified sites   . Acute kidney failure with other specified pathological lesion in kidney   . Prostatitis   . Postsurgical percutaneous transluminal coronary angioplasty status     weintraub  . Cataract extraction status `  . Peripheral vascular disease, unspecified   . Personal history of malignant neoplasm of larynx   . Elevated prostate specific antigen (PSA)   . Gout, unspecified   . Acute peptic ulcer with hemorrhage   . Unspecified essential hypertension   . Hyperlipidemia    Past Surgical History:  Past Surgical History  Procedure Laterality Date  . Percutaneous translumional coronary angioplastty      stents to bilateral legs for PVD  . Cataract extraction    . Truncal vagotomy  06/2005  . Gastrojejunosotomy  06/2005  . Cholecystectomy  06/2005   HPI:  79 year old male admitted 07/04/13 s/p fall.  PMH significant for COPD, throat CA s/p radiation treatment.  RN contacted SLP re: choking episodes, sometimes on secretions.   Assessment / Plan / Recommendation Clinical Impression  Pt allowed only minimal oral care.  Refused to have upper dentures removed to be cleaned.  No overt s/s aspiration observed at this time, however, RN and nursing aide report pt has had choking episodes, sometimes on his own secretions.  Pt has a history of 10 radiation treatments for throat cancer, and he reports occasional difficulty swallowing because of the cancer.  Will modify diet to full liquid, and complete MBS tomorrow if MD agrees.  Radiation treatment can continue to adversely affect healthy tissues, even years after treatment has ended.  Pt, RN, and grandson aware and  in agreement with MBS.    Aspiration Risk  Moderate    Diet Recommendation  (full liquid)   Liquid Administration via: Straw Medication Administration: Crushed with puree Supervision: Patient able to self feed;Full supervision/cueing for compensatory strategies Compensations: Slow rate;Small sips/bites Postural Changes and/or Swallow Maneuvers: Seated upright 90 degrees;Upright 30-60 min after meal    Other  Recommendations Recommended Consults: MBS Oral Care Recommendations: Oral care before and after PO Other Recommendations: Have oral suction available   Follow Up Recommendations   (pending MBS recommendations)    Frequency and Duration Other (Comment) (pending MBS results)  Other (Comment) (pending MBS results)   Pertinent Vitals/Pain No pain reported    SLP Swallow Goals Patient will consume recommended diet without observed clinical signs of aspiration with: Moderate assistance Patient will utilize recommended strategies during swallow to increase swallowing safety with: Moderate assistance   Swallow Study Prior Functional Status   Pt reports difficulty swallowing due to throat cancer.  Eating regular foods, thin liquids prior to admit.    General Date of Onset: 07/04/13 HPI: 79 year old male admitted 07/04/13 s/p fall.  PMH significant for COPD, throat CA s/p radiation treatment.  RN contacted SLP re: choking episodes, sometimes on secretions. Type of Study: Bedside swallow evaluation Diet Prior to this Study: Regular;Thin liquids Temperature Spikes Noted: No Respiratory Status: Supplemental O2 delivered via (comment) ([place]@2L ) History of Recent Intubation: No Behavior/Cognition: Alert;Cooperative;Pleasant mood Oral Cavity - Dentition: Dentures, top Self-Feeding Abilities: Able to feed self Patient Positioning: Upright in bed Baseline Vocal Quality: Clear Volitional Cough: Weak Volitional Swallow:  Unable to elicit    Oral/Motor/Sensory Function Overall Oral  Motor/Sensory Function: Appears within functional limits for tasks assessed   Ice Chips Ice chips: Within functional limits Presentation: Spoon   Thin Liquid Thin Liquid: Within functional limits Presentation: Straw    Nectar Thick Nectar Thick Liquid: Not tested   Honey Thick Honey Thick Liquid: Not tested   Puree Puree: Within functional limits Presentation: Spoon   Solid   GO    Solid: Not tested      Suzi Hernan B. Murvin Natal [facility], CCC-SLP [phone]  Leigh Aurora 07/09/2013,4:54 PM

## 2013-07-09 NOTE — Progress Notes (Signed)
Subjective: Not feeling well, some vominting, weak, decreased appetite  Objective: Vital signs in last 24 hours: Temp:  [97.5 F (36.4 C)-98.7 F (37.1 C)] 98.7 F (37.1 C) (08/16 2205) Pulse Rate:  [101-108] 101 (08/16 2205) Resp:  [20] 20 (08/16 2205) BP: (135-151)/(83-85) 151/85 mmHg (08/16 2205) SpO2:  [92 %-100 %] 100 % (08/16 2205) Weight:  [69.8 kg (153 lb 14.1 oz)-70 kg (154 lb 5.2 oz)] 69.8 kg (153 lb 14.1 oz) (08/17 0349) Weight change: 4 kg (8 lb 13.1 oz) Last BM Date: 07/07/13  Intake/Output from previous day: 08/16 0701 - 08/17 0700 In: 2945 [P.O.:660; I.V.:2285] Out: 200 [Urine:200] Intake/Output this shift:    General appearance: alert and cooperative Resp: clear to auscultation bilaterally Cardio: regular rate and rhythm, S1, S2 normal, no murmur, click, rub or gallop GI: soft, non-tender; bowel sounds normal; no masses,  no organomegaly Extremities: extremities normal, atraumatic, no cyanosis or edema  Lab Results:  Recent Labs  07/07/13 0620  WBC 9.0  HGB 11.9*  HCT 33.8*  PLT 150   BMET  Recent Labs  07/08/13 0715 07/09/13 0548  NA 137 136  K 5.1 4.6  CL 100 100  CO2 25 24  GLUCOSE 113* 104*  BUN 14 22  CREATININE 1.90* 2.75*  CALCIUM 9.4 8.8    Studies/Results: No results found.  Medications: I have reviewed the patient's current medications.  Assessment/Plan: 1. Rhabdo - in last 48 hours has developed acute oliguric renal failure. Holding lasix and decrease normal saline to 50 cc/hr. Check renal u/s to rule out obstruction and UA, urine sodium.  Ask nephrology to see 2. Falls - discharge on hold.  Declined PT yesterday 3. CAP - RLL.  Continue ceftin po and azithromycin  4. Elevated PSA, probable metastatic prostate cancer, possible mets in C spine. 5. Disposition  Discharge on hold   LOS: 5 days   Marionette Meskill JOSEPH 07/09/2013, 9:53 AM

## 2013-07-09 NOTE — Consult Note (Signed)
Patient seen and examined.  Agree with excellent assessment and plan as per Dr Darden Palmer, R2.  Mr Ludvigsen has hx of HTN, larnygeal Ca and PVD.  He was admitted several days ago after being found on the floor at home after falling.  CPK was 8000, but has since returned back close to baseline. "Normal" renal function on admit has deteriorated over the past 48 hrs. Exam shows euvolemia, poor breath sounds and suprapubic fullness. UA on admit suggested myoglobinuria, but AKI due to rhabdomyolysis at this point is not likely. We suspect bladder outlet obstruction (high PSA, exam) or less likely AIN due to cephalosporins.  We have ordered a bladder scan which was abnormal with 880cc, he is going for a renal US now.  Await results before ordering further tests.  Vinson Moselle  MD Pager [phone]    Cell  [phone] 07/09/2013, 2:46 PM

## 2013-07-09 NOTE — Progress Notes (Signed)
52F foley catheter inserted per order.  Pt. Tolerated well.  Obtained 700 ml of clear urine.  Will continue to monitor.  Forbes Cellar, RN

## 2013-07-09 NOTE — Consult Note (Signed)
[facility] CONSULT NOTE    Date: 07/09/2013                  Patient Name:  Wesley [name]  MRN: [id number]  DOB: [birth year]  Age / Sex: 79 y.o., male         PCP: Illene Regulus, MD                 Service Requesting Consult: Dr. Debby Bud                 Reason for Consult: Acute renal failure            History of Present Illness: Wesley [name] is a very pleasant 79 y.o.African American male with a PMHx of PVD, elevated PSA with suspicion for postate ca, larynx carcinoma s/p radiation, and HTN who was admitted to [facility] on 07/04/2013 s/p mechanical fall after getting out of his recline.  He denies hitting his head or LOC but he was too weak to stand so he laid on the floor until the next morning when his aide saw him and called 911.  He reports urinary and bowel incontinence while he was laying on the floor and says he vomited green stuff when they helped him up.  Since hospital admission, he was treated for rhabdomyolysis with aggressive IVF with notable improvement of CK, and started on abx for RLL CAP.  He has continued to have vomiting and diarrhea daily and reports abdominal pain at this time as well.  Since yesterday, he is also noted to have decreased urine output with rise in Cr.  Today his Cr is up to 2.75.  He denies any dysuria and hx of incontinence, but has been using a condom cath this admission.  He also denies any chest pain, and has baseline SOB, but denies any fever, chills.  He continues to have a productive cough and is noted to smoke approximately 6 cigarettes per day.  He had another episode of loose BM this morning and vomiting shortly after taking his medications this morning.   Medications: Outpatient medications: Prescriptions prior to admission  Medication Sig Dispense Refill  . amLODipine (NORVASC) 5 MG tablet Take 5 mg by mouth daily.      Marland Kitchen aspirin EC 81 MG tablet Take 81 mg by mouth daily.      . metoprolol (LOPRESSOR) 50 MG tablet Take 50 mg by mouth  daily.      Marland Kitchen PRESCRIPTION MEDICATION Apply 1 application topically daily. Medicated cream for sores on legs       Current medications: Current Facility-Administered Medications  Medication Dose Route Frequency Provider Last Rate Last Dose  . 0.9 %  sodium chloride infusion   Intravenous Continuous Lillia Mountain, MD 100 mL/hr at 07/09/13 0551    . alum & mag hydroxide-simeth (MAALOX/MYLANTA) 200-200-20 MG/5ML suspension 30 mL  30 mL Oral Q4H PRN Jacques Navy, MD      . amLODipine (NORVASC) tablet 5 mg  5 mg Oral Daily Jacques Navy, MD   5 mg at 07/09/13 1041  . antiseptic oral rinse (BIOTENE) solution 15 mL  15 mL Mouth Rinse BID Renae Fickle, MD   15 mL at 07/09/13 0800  . aspirin EC tablet 81 mg  81 mg Oral Daily Renae Fickle, MD   81 mg at 07/09/13 1041  . azithromycin (ZITHROMAX) tablet 250 mg  250 mg Oral Daily Jacques Navy, MD   250 mg at 07/09/13 1041  .  cefUROXime (CEFTIN) tablet 250 mg  250 mg Oral BID WC Jacques Navy, MD   250 mg at 07/09/13 1041  . enoxaparin (LOVENOX) injection 30 mg  30 mg Subcutaneous Q24H Crystal [place], RPH   30 mg at 07/08/13 1754  . feeding supplement (ENSURE COMPLETE) liquid 237 mL  237 mL Oral BID BM Tonye Becket, RD   237 mL at 07/09/13 1042  . hydrocerin (EUCERIN) cream   Topical BID Renae Fickle, MD   1 application at 07/09/13 1050  . LORazepam (ATIVAN) tablet 0.5 mg  0.5 mg Oral Q4H PRN Jacques Navy, MD   0.5 mg at 07/08/13 4098  . metoprolol tartrate (LOPRESSOR) tablet 25 mg  25 mg Oral BID Jacques Navy, MD   25 mg at 07/09/13 1041  . mupirocin ointment (BACTROBAN) 2 %   Topical BID Renae Fickle, MD      . potassium chloride SA (K-DUR,KLOR-CON) CR tablet 40 mEq  40 mEq Oral Once Renae Fickle, MD      . sodium chloride 0.9 % injection 3 mL  3 mL Intravenous Q12H Renae Fickle, MD   3 mL at 07/07/13 2231    Allergies: No Known Allergies   Past Medical History: Past Medical History   Diagnosis Date  . Osteoarthrosis, unspecified whether generalized or localized, other specified sites   . Acute kidney failure with other specified pathological lesion in kidney   . Prostatitis   . Postsurgical percutaneous transluminal coronary angioplasty status     weintraub  . Cataract extraction status `  . Peripheral vascular disease, unspecified   . Personal history of malignant neoplasm of larynx   . Elevated prostate specific antigen (PSA)   . Gout, unspecified   . Acute peptic ulcer with hemorrhage   . Unspecified essential hypertension   . Hyperlipidemia    Past Surgical History: Past Surgical History  Procedure Laterality Date  . Percutaneous translumional coronary angioplastty      stents to bilateral legs for PVD  . Cataract extraction    . Truncal vagotomy  06/2005  . Gastrojejunosotomy  06/2005  . Cholecystectomy  06/2005   Family History: Family History  Problem Relation Age of Onset  . Heart disease Father   . Prostate cancer Brother   . Heart disease Brother    Social History: History   Social History  . Marital Status: Divorced    Spouse Name: N/A    Number of Children: N/A  . Years of Education: N/A   Occupational History  . Not on file.   Social History Main Topics  . Smoking status: Current Some Day Smoker -- 0.50 packs/day    Types: Cigarettes, Cigars  . Smokeless tobacco: Not on file  . Alcohol Use: Yes     Comment: 2-3 shots of scotch per day  . Drug Use: No  . Sexual Activity: No   Other Topics Concern  . 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   End-of-life: wants CPR but no prolonged heroic measures.   Review of Systems:  Constitutional:  Decreased appetite and fatigue.  Denies fever, chills, diaphoresis.  HEENT:  Congestion.   Respiratory:   SOB, DOE, productive cough.   Cardiovascular:  Denies palpitations or chest pain  Gastrointestinal:  Vomiting, abdominal pain, diarrhea, abdominal distention.   Genitourinary:  Incontinence.    Musculoskeletal:  PVD, diffuse weakness.  Skin:  Denies pallor, rash and wound.   Neurological:  Denies dizziness, seizures, syncope, weakness, light-headedness, numbness and headaches.    Vital Signs: Blood pressure 122/80, pulse 101, temperature 98.7 F (37.1 C), temperature source Oral, resp. rate 20, height 5\' 11"  (1.803 m), weight 153 lb 14.1 oz (69.8 kg), SpO2 100.00%.  Weight trends: Filed Weights   07/08/13 0500 07/08/13 2205 07/09/13 0349  Weight: 145 lb 8.1 oz (66 kg) 154 lb 5.2 oz (70 kg) 153 lb 14.1 oz (69.8 kg)   Physical Exam: Vitals reviewed. General: resting in bed, NAD, cachectic HEENT: PERRL, EOMI Cardiac: RRR Pulm: coarse b/l breath sounds Abd: soft, distended, +bs, tenderness to palpation epigastric and periumbilical region, well healed midline surgical scar.  Ext: cool, tenderness to palpation, darkened skin, LLL C/D/I dressing, tenderness to palpation, moving all 4 extremities Neuro: alert and oriented X3, strength 3/5 b/l upper and lower extremities, and decreased sensation b/l lower extremities  Lab results: Basic Metabolic Panel:  Recent Labs Lab 07/04/13 2118  07/07/13 0620 07/08/13 0715 07/09/13 0548  NA  --   < > 137 137 136  K  --   < > 3.1* 5.1 4.6  CL  --   < > 98 100 100  CO2  --   < > 24 25 24   GLUCOSE  --   < > 123* 113* 104*  BUN  --   < > 9 14 22   CREATININE  --   < > 1.06 1.90* 2.75*  CALCIUM  --   < > 8.6 9.4 8.8  MG 1.4*  --   --   --   --   < > = values in this interval not displayed.  Liver Function Tests:  Recent Labs Lab 07/04/13 1121 07/05/13 0610  AST 167* 149*  ALT 50 43  ALKPHOS 77 63  BILITOT 2.3* 1.3*  PROT 7.5 5.9*  ALBUMIN 3.2* 2.5*    Recent Labs Lab 07/04/13 1121  LIPASE 13   CBC:  Recent Labs Lab  07/04/13 1121 07/05/13 0610 07/07/13 0620  WBC 6.0 6.5 9.0  NEUTROABS 5.5  --  8.2*  HGB 13.6 11.4* 11.9*  HCT 38.5* 31.8* 33.8*  MCV 103.2* 101.9* 102.7*  PLT 128* 113* 150   Cardiac Enzymes:  Recent Labs Lab 07/04/13 1121 07/04/13 2118 07/05/13 0610 07/07/13 0620 07/08/13 0715 07/09/13 0548  CKTOTAL 7169*  --  4465* 1745* 753* 320*  TROPONINI <0.30 <0.30 <0.30  --   --   --    Microbiology: Results for orders placed during the hospital encounter of 02/14/11  URINE CULTURE     Status: None   Collection Time    02/14/11 10:55 PM      Result Value Range Status   Specimen Description URINE, RANDOM   Final   Special Requests NONE   Final   Culture  Setup Time [id number]   Final   Colony Count >=100,000 COLONIES/ML   Final   Culture ESCHERICHIA COLI   Final   Report Status 02/17/2011 FINAL   Final   Organism ID, Bacteria ESCHERICHIA COLI   Final  CULTURE, BLOOD (ROUTINE X 2)     Status: None   Collection Time    02/14/11 11:23 PM      Result Value Range Status   Specimen Description BLOOD ARM RIGHT   Final  Special Requests BOTTLES DRAWN AEROBIC ONLY 10CC   Final   Culture  Setup Time [id number]   Final   Culture NO GROWTH 5 DAYS   Final   Report Status 02/21/2011 FINAL   Final  CULTURE, BLOOD (ROUTINE X 2)     Status: None   Collection Time    02/14/11 11:28 PM      Result Value Range Status   Specimen Description BLOOD ARM LEFT   Final   Special Requests BOTTLES DRAWN AEROBIC ONLY 5CC   Final   Culture  Setup Time [id number]   Final   Culture NO GROWTH 5 DAYS   Final   Report Status 02/21/2011 FINAL   Final  MRSA PCR SCREENING     Status: None   Collection Time    02/15/11  4:45 AM      Result Value Range Status   MRSA by PCR    NEGATIVE Final   Value: NEGATIVE            The GeneXpert MRSA Assay (FDA     approved for NASAL specimens     only), is one component of a     comprehensive MRSA colonization     surveillance program. It is not      intended to diagnose MRSA     infection nor to guide or     monitor treatment for     MRSA infections.   Urinalysis    Component Value Date/Time   COLORURINE AMBER* 07/04/2013 1123   APPEARANCEUR CLEAR 07/04/2013 1123   LABSPEC 1.015 07/04/2013 1123   PHURINE 5.5 07/04/2013 1123   GLUCOSEU NEGATIVE 07/04/2013 1123   HGBUR LARGE* 07/04/2013 1123   BILIRUBINUR SMALL* 07/04/2013 1123   KETONESUR 15* 07/04/2013 1123   PROTEINUR 100* 07/04/2013 1123   UROBILINOGEN 1.0 07/04/2013 1123   NITRITE NEGATIVE 07/04/2013 1123   LEUKOCYTESUR NEGATIVE 07/04/2013 1123   Assessment & Plan: Mr. Tera Mater is a 79 y.o. African American male with a PMHX of PVD s/p stents, elevated PSA/prostatitis, malignant neoplasm of larynx, HTN, PUD with hx of hemorrhage, and OA, who was admitted to [facility] on 07/04/2013 s/p fall for rhabdomyolysis, lactic acidosis, and CAP.  CK was trending down with IVF, however, now oliguria with acute renal failure.  Acute renal failure--?obstructive etiology with hx of BPH.  Admitted for rhabdomyolysis initially.  No hx of CKD.  Cr on admission 1.03, trending up, now 2.75.  S/p aggressive IVF hydration then given lasix.  CK trending down: 7169-->4465-->1745-->753-->320 (today). Yesterday's output noted to be 200cc (likely not accurate as condom cath continued to come off).  Today he continues to have minimal documented urine output with net positive 661 since admission.  His weight has increased significantly during this admission, 138lbs on admission increased to 153lbs today.  He was started on lasix 8/13-/8/15 then discontinued with rise in Cr and decreased urine output. Last abdominal ultrasound noted in 2012: with normal size and echogenicity of b/l kidneys.  Does have hx of enlarged prostate with elevated PSA 06/2013 of 434.  Bladder scan by bedside this afternoon ~800cc.  Appears euvolemic at this time.    Recent Labs Lab 07/05/13 0610 07/06/13 0932 07/07/13 0620 07/08/13 0715 07/09/13 0548   CREATININE 0.87 0.92 1.06 1.90* 2.75*   -renal ultrasound pending -he is refusing foley catheter placement at this time, but will need it with caution of likely obstruction and enlarged prostate pending ultrasound results -continue to trend renal function  CAP: currently  on Ceftin and Azithromycin day 6 of antibiotics for RLL PNA.  Afebrile with no leukocytosis. Was initially on Ceftriaxone and doxycycline.  Lots of thick secretions from productive cough. -management per primary team -will likely benefit from pulmonary PT  Case discussed and patient seen with Dr. Arlean Hopping.  Signed: Darden Palmer, MD PGY-2, Internal Medicine Resident Pager: [phone]  07/09/2013,2:10 PM

## 2013-07-10 ENCOUNTER — Inpatient Hospital Stay (HOSPITAL_COMMUNITY): Payer: Medicare Other

## 2013-07-10 DIAGNOSIS — Z8521 Personal history of malignant neoplasm of larynx: Secondary | ICD-10-CM

## 2013-07-10 DIAGNOSIS — I509 Heart failure, unspecified: Secondary | ICD-10-CM

## 2013-07-10 DIAGNOSIS — N320 Bladder-neck obstruction: Secondary | ICD-10-CM

## 2013-07-10 DIAGNOSIS — R972 Elevated prostate specific antigen [PSA]: Secondary | ICD-10-CM

## 2013-07-10 DIAGNOSIS — N179 Acute kidney failure, unspecified: Secondary | ICD-10-CM

## 2013-07-10 LAB — CBC
HCT: 28.3 % — ABNORMAL LOW (ref 39.0–52.0)
Hemoglobin: 10.1 g/dL — ABNORMAL LOW (ref 13.0–17.0)
WBC: 5.1 10*3/uL (ref 4.0–10.5)

## 2013-07-10 LAB — RENAL FUNCTION PANEL
CO2: 25 mEq/L (ref 19–32)
Chloride: 99 mEq/L (ref 96–112)
GFR calc Af Amer: 26 mL/min — ABNORMAL LOW (ref >90)
GFR calc non Af Amer: 22 mL/min — ABNORMAL LOW (ref >90)
Potassium: 4.1 mEq/L (ref 3.5–5.1)
Sodium: 136 mEq/L (ref 135–145)

## 2013-07-10 LAB — PRO B NATRIURETIC PEPTIDE: Pro B Natriuretic peptide (BNP): 3789 pg/mL — ABNORMAL HIGH (ref 0–450)

## 2013-07-10 MED ORDER — FUROSEMIDE 10 MG/ML IJ SOLN
20.0000 mg | Freq: Two times a day (BID) | INTRAMUSCULAR | Status: AC
  Administered 2013-07-10 (×2): 20 mg via INTRAVENOUS

## 2013-07-10 MED ORDER — FUROSEMIDE 10 MG/ML IJ SOLN: 20.0000 mg | Freq: Three times a day (TID) | INTRAMUSCULAR | Status: DC

## 2013-07-10 MED ORDER — IOHEXOL 300 MG/ML  SOLN
25.0000 mL | INTRAMUSCULAR | Status: AC
  Administered 2013-07-10 (×2): 25 mL via ORAL

## 2013-07-10 NOTE — Progress Notes (Addendum)
Subjective: Events over long-weekend noted with acute renal insufficiency secondary to possible AIN plus bladder outlet obstruction. CK did come down to 320. New sings of heart failure with elevated BNP and pul edema on chest x-ray.  Mr. Wesley [name] denies any discomfort. He is not feeling SOB.  He is advised of the abnormal PSA and possible prostate cancer  Objective: Lab:  Recent Labs  07/10/13 0540  WBC 5.1  HGB 10.1*  HCT 28.3*  MCV 102.9*  PLT 148*    Recent Labs  07/08/13 0715 07/09/13 0548 07/10/13 0540  NA 137 136 136  K 5.1 4.6 4.1  CL 100 100 99  GLUCOSE 113* 104* 88  BUN 14 22 25*  CREATININE 1.90* 2.75* 2.39*  CALCIUM 9.4 8.8 8.7  MG  --   --  1.6  PHOS  --   --  2.9   BNP 3790  Imaging: Renal U/S 8/15: IMPRESSION:  1. Bilateral hydronephrosis. No diagnostic renal calculus. Mild  proximal right hydroureter.  2. Bilateral echogenic kidney probable due to atrophy or chronic  medical renal disease.  3. Distended urinary bladder. Clinical correlation is necessary  to exclude bladder outlet obstruction. Urinary bladder measures  846 ml.  CXR 8/17: IMPRESSION: Congestive heart failure superimposed on underlying  emphysema. Consolidation medial left base. Suspect superimposed  pneumonia in this area.  Scheduled Meds: . amLODipine  5 mg Oral Daily  . antiseptic oral rinse  15 mL Mouth Rinse BID  . aspirin EC  81 mg Oral Daily  . azithromycin  250 mg Oral Daily  . cefUROXime  250 mg Oral BID WC  . enoxaparin (LOVENOX) injection  30 mg Subcutaneous Q24H  . feeding supplement  237 mL Oral BID BM  . hydrocerin   Topical BID  . metoprolol tartrate  25 mg Oral BID  . mupirocin ointment   Topical BID  . potassium chloride  40 mEq Oral Once  . sodium chloride  3 mL Intravenous Q12H   Continuous Infusions:  PRN Meds:.albuterol, alum & mag hydroxide-simeth, LORazepam   Physical Exam: Filed Vitals:   07/10/13 0551  BP: 97/66  Pulse: 98  Temp: 98.2 F (36.8  C)  Resp: 18   Thin, elderly AA man in no distress HEENT - temporal wasting Cor - Heart sounds are distant but rgular Pulm - no increased WOB, decreased BS, no rale, no wheezes Abd - scaphoid, BS+, no tender in the suprapubic region Genitalia - foley catheter in place./      Assessment/Plan: 1. Rhabdo - resolved with last CK 320  2. Falls - no change  3. CP #6 antibiotics. CXR still read with conslidation and possible pneumonia. He has completed course of azithromycin. With no leukocytosis, cough or sputum there is little evidence of continued infection. He may have acute interstial nephritis related to antibiotics  Plan D/c antibiotics  4. Renal - patient with acute renal failure. Creatinine 2.75 to 2.39. Urine output 3200. Bilateral hydronephrosis on U/S. May bercombination of AIN and BOO from enlarged prostate. May have post-obstructive diuresis.  Plan  Appreciate nephrology's assistance  CT Pelvis for better assessment of prostate  D/C antibiotics  May need urology consult  5. CHF - patient with pulmonary edema on chest x-ray, elevated BNP. No increased work of breathing. He has had good UOP.  Plan 2D echo ` Diuretics - two doses furosemide IV 20 mg - for failure   6. Swallow - appreciate SLP consult.  Plan Patient for Modified barium swallow.  Illene Regulus [facility] (o) [phone]; (c) [phone] Call-grp - [facility]  Tele: [phone]  07/10/2013, 7:24 AM

## 2013-07-10 NOTE — Progress Notes (Signed)
[facility] ROUNDING NOTE   Subjective:   Wesley [name] was seen and examined at bedside.  He wishes to go home soon. He has no complaints at this time, denies fever, chills, abdominal pain, or dysuria.  He continues to have loose BM in AM but no more nausea or vomiting.   Objective:  Vital signs in last 24 hours:  Temp:  [98.2 F (36.8 C)-98.7 F (37.1 C)] 98.2 F (36.8 C) (08/18 0551) Pulse Rate:  [96-102] 100 (08/18 0925) Resp:  [18-20] 18 (08/18 0551) BP: (97-147)/(66-80) 108/68 mmHg (08/18 0925) SpO2:  [94 %-98 %] 98 % (08/18 0551) Weight:  [167 lb 1.7 oz (75.8 kg)] 167 lb 1.7 oz (75.8 kg) (08/18 0500)  Weight change: 12 lb 12.6 oz (5.8 kg) Filed Weights   07/09/13 0349 07/09/13 2118 07/10/13 0500  Weight: 153 lb 14.1 oz (69.8 kg) 167 lb 1.7 oz (75.8 kg) 167 lb 1.7 oz (75.8 kg)    Intake/Output: I/O last 3 completed shifts: In: 3180.8 [P.O.:240; I.V.:2940.8] Out: 3200 [Urine:3200]   Intake/Output this shift:  Total I/O In: 3 [I.V.:3] Out: -   Physical Exam:  Vitals reviewed.  General: resting in bed, NAD, cachectic  HEENT: PERRL, EOMI  Cardiac: RRR  Pulm: coarse b/l breath sounds  Abd: soft, distended, +bs, mild tenderness to palpation epigastric and periumbilical region, well healed midline surgical scar. Foley in place. Ext: cool, tenderness to palpation, darkened skin, LLL C/D/I dressing, tenderness to palpation, moving all 4 extremities  Neuro: alert and oriented X3, strength 3/5 b/l upper and lower extremities, and decreased sensation b/l lower extremities  Basic Metabolic Panel:  Recent Labs Lab 07/04/13 2118  07/06/13 0932 07/07/13 0620 07/08/13 0715 07/09/13 0548 07/10/13 0540  NA  --   < > 136 137 137 136 136  K  --   < > 2.4* 3.1* 5.1 4.6 4.1  CL  --   < > 95* 98 100 100 99  CO2  --   < > 23 24 25 24 25   GLUCOSE  --   < > 101* 123* 113* 104* 88  BUN  --   < > 9 9 14 22  25*  CREATININE  --   < > 0.92 1.06 1.90* 2.75* 2.39*   CALCIUM  --   < > 8.2* 8.6 9.4 8.8 8.7  MG 1.4*  --   --   --   --   --  1.6  PHOS  --   --   --   --   --   --  2.9  < > = values in this interval not displayed.  Liver Function Tests:  Recent Labs Lab 07/04/13 1121 07/05/13 0610 07/10/13 0540  AST 167* 149*  --   ALT 50 43  --   ALKPHOS 77 63  --   BILITOT 2.3* 1.3*  --   PROT 7.5 5.9*  --   ALBUMIN 3.2* 2.5* 2.2*    Recent Labs Lab 07/04/13 1121  LIPASE 13   CBC:  Recent Labs Lab 07/04/13 1121 07/05/13 0610 07/07/13 0620 07/10/13 0540  WBC 6.0 6.5 9.0 5.1  NEUTROABS 5.5  --  8.2*  --   HGB 13.6 11.4* 11.9* 10.1*  HCT 38.5* 31.8* 33.8* 28.3*  MCV 103.2* 101.9* 102.7* 102.9*  PLT 128* 113* 150 148*   Cardiac Enzymes:  Recent Labs Lab 07/04/13 1121 07/04/13 2118 07/05/13 0610 07/07/13 0620 07/08/13 0715 07/09/13 0548  CKTOTAL 7169*  --  4465* 1745*  753* 320*  TROPONINI <0.30 <0.30 <0.30  --   --   --    Microbiology: Results for orders placed during the hospital encounter of 02/14/11  URINE CULTURE     Status: None   Collection Time    02/14/11 10:55 PM      Result Value Range Status   Specimen Description URINE, RANDOM   Final   Special Requests NONE   Final   Culture  Setup Time [id number]   Final   Colony Count >=100,000 COLONIES/ML   Final   Culture ESCHERICHIA COLI   Final   Report Status 02/17/2011 FINAL   Final   Organism ID, Bacteria ESCHERICHIA COLI   Final  CULTURE, BLOOD (ROUTINE X 2)     Status: None   Collection Time    02/14/11 11:23 PM      Result Value Range Status   Specimen Description BLOOD ARM RIGHT   Final   Special Requests BOTTLES DRAWN AEROBIC ONLY 10CC   Final   Culture  Setup Time [id number]   Final   Culture NO GROWTH 5 DAYS   Final   Report Status 02/21/2011 FINAL   Final  CULTURE, BLOOD (ROUTINE X 2)     Status: None   Collection Time    02/14/11 11:28 PM      Result Value Range Status   Specimen Description BLOOD ARM LEFT   Final   Special Requests  BOTTLES DRAWN AEROBIC ONLY 5CC   Final   Culture  Setup Time [id number]   Final   Culture NO GROWTH 5 DAYS   Final   Report Status 02/21/2011 FINAL   Final  MRSA PCR SCREENING     Status: None   Collection Time    02/15/11  4:45 AM      Result Value Range Status   MRSA by PCR    NEGATIVE Final   Value: NEGATIVE            The GeneXpert MRSA Assay (FDA     approved for NASAL specimens     only), is one component of a     comprehensive MRSA colonization     surveillance program. It is not     intended to diagnose MRSA     infection nor to guide or     monitor treatment for     MRSA infections.   Urinalysis:  Recent Labs  07/09/13 1300  COLORURINE YELLOW  LABSPEC 1.006  PHURINE 7.0  GLUCOSEU NEGATIVE  HGBUR TRACE*  BILIRUBINUR NEGATIVE  KETONESUR NEGATIVE  PROTEINUR NEGATIVE  UROBILINOGEN 0.2  NITRITE NEGATIVE  LEUKOCYTESUR NEGATIVE    Imaging: US Renal  07/09/2013   *RADIOLOGY REPORT*  Clinical Data: Acute renal failure  RENAL/URINARY TRACT ULTRASOUND  Technique: Renal ultrasound  Comparison:   CT scan abdomen 06/06/2009  Findings: Right kidney measures 5.7 cm in length.  There is mild right hydronephrosis.  Proximal right hydroureter which measures 9.8 mm in diameter.  No diagnostic renal calculus.  Left kidney measures 10 cm in length.  Mild left hydronephrosis.  Bilateral increased cortical renal echogenicity probable due to medical renal disease or atrophy.  A distended urinary bladder is noted.  The patient states is not able to void.  Clinical correlation is necessary to exclude a bladder outlet obstruction.  The urinary bladder measures 846 ml.  IMPRESSION:  1.  Bilateral hydronephrosis.  No diagnostic renal calculus.  Mild proximal right hydroureter. 2.  Bilateral echogenic kidney probable due to  atrophy or chronic medical renal disease. 3.  Distended urinary bladder.  Clinical correlation is necessary to exclude bladder outlet obstruction.  Urinary bladder measures 846  ml.   Original Report Authenticated By: Natasha Mead, M.D.   Dg Chest Port 1 View  07/09/2013   *RADIOLOGY REPORT*  Clinical Data: Cough and congestion  PORTABLE CHEST - 1 VIEW  Comparison:  July 05, 2013  Findings:  There is interstitial and patchy alveolar edema in the lower lung zones bilaterally.  There are small effusions bilaterally, slightly larger on the left than the right.  Heart is upper normal in size.  The pulmonary vascularity is indicative of underlying emphysema.  There is consolidation in the left base medially.  No pneumothorax.  No adenopathy.  There is atherosclerotic change in the aorta.  IMPRESSION: Congestive heart failure superimposed on underlying emphysema.  Consolidation medial left base.  Suspect superimposed pneumonia in this area.   Original Report Authenticated By: Bretta Bang, M.D.   Medications:     . amLODipine  5 mg Oral Daily  . antiseptic oral rinse  15 mL Mouth Rinse BID  . aspirin EC  81 mg Oral Daily  . enoxaparin (LOVENOX) injection  30 mg Subcutaneous Q24H  . feeding supplement  237 mL Oral BID BM  . furosemide  20 mg Intravenous Q12H  . hydrocerin   Topical BID  . iohexol  25 mL Oral Q1 Hr x 2  . metoprolol tartrate  25 mg Oral BID  . mupirocin ointment   Topical BID  . potassium chloride  40 mEq Oral Once  . sodium chloride  3 mL Intravenous Q12H   albuterol, alum & mag hydroxide-simeth, LORazepam  Assessment/ Plan:  Wesley [name] is a 79 y.o. African American male with a PMHX of PVD s/p stents, elevated PSA/prostatitis, malignant neoplasm of larynx, HTN, PUD with hx of hemorrhage, and OA, who was admitted to [facility] on 07/04[facility]2013 s/p fall for rhabdomyolysis, lactic acidosis, and CAP. CK was trending down with IVF, however, developed oliguria with acute renal failure.   Acute renal failure--2/2 obstructive etiology with hx of BPH.  Improving. Renal ultrasound significant for b/l hydronephrosis, mild proximal right hydroureter, b/l echogenic kidney,  and distended bladder.  Admitted for rhabdomyolysis initially. No hx of CKD. Cr on admission 1.03, trending up to 2.75 now improving.   Recent Labs Lab 07/06/13 0932 07/07/13 0620 07/08/13 0715 07/09/13 0548 07/10/13 0540  CREATININE 0.92 1.06 1.90* 2.75* 2.39*   -will need urology follow up -continue to trend renal function   CAP: currently on Ceftin and Azithromycin RLL PNA. Afebrile with no leukocytosis. Was initially on Ceftriaxone and doxycycline. Lots of thick secretions from productive cough.  -management per primary team  -will likely benefit from pulmonary PT  Case discussed and patient seen with Dr. Arrie Aran   LOS: 6 Signed: Darden Palmer, MD PGY-2, Internal Medicine Resident Pager: [phone]  07/10/2013,10:24 AM

## 2013-07-10 NOTE — Progress Notes (Signed)
Speech Language Pathology  Patient Details Name: Wesley [name] MRN: [id number] DOB: 09-Jul-1920 Today's Date: 07/10/2013 Time:  -    MBS was scheduled for today.  Pt. NPO for abdominal study.  XRAY arranged with CT for MBS to be completed after CT and not to send pt. Back to room, however transport sent pt. To floor without Xray's knowledge.  MBS to be completed tomorrow.  Breck Coons [place] [phone]  07/10/2013

## 2013-07-10 NOTE — Progress Notes (Signed)
I have seen and examined this patient and agree with plan as outlined by Dr. Virgina Organ.  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  Improving Scr and UOP. Will need urology evaluation.  Cont with current plan. Dvontae Ruan A,MD 07/10/2013 10:39 AM

## 2013-07-10 NOTE — Clinical Social Work Note (Signed)
CSW informed that patient wants SNF. CSW and RNCM approached patient to confirm this and patient once again states that he does not want to go to a facility after discharge. CSW signing off.   Roddie Mc, [place], [place], [phone]

## 2013-07-10 NOTE — Progress Notes (Signed)
Physical Therapy Treatment Patient Details Name: Wesley [name] MRN: [id number] DOB: 03-19-1920 Today's Date: 07/10/2013 Time: 0924-1000 PT Time Calculation (min): 36 min  PT Assessment / Plan / Recommendation  History of Present Illness Pt is a 79 y/o male admitted after a fall with inability to get up. Per chart review, pt was on the floor for a day and a half before his aide found him and called 911. Pt subsequently with rhabdomyolysis as a result of dehydration.    PT Comments   Pt more agreeable to mobility today, but still not safe to mobilize without A.  Pt needs cues to use the brakes on his 4WW every time and A to prevent 4WW from rolling out from under pt as he relies on it so heavily for support.  Still feel SNF is safest D/C option.    Follow Up Recommendations  SNF     Does the patient have the potential to tolerate intense rehabilitation     Barriers to Discharge        Equipment Recommendations  None recommended by PT    Recommendations for Other Services    Frequency Min 3X/week   Progress towards PT Goals Progress towards PT goals: Progressing toward goals  Plan Current plan remains appropriate    Precautions / Restrictions Precautions Precautions: Fall Restrictions Weight Bearing Restrictions: No   Pertinent Vitals/Pain Indicates stiff.      Mobility  Bed Mobility Bed Mobility: Supine to Sit;Sitting - Scoot to Edge of Bed Supine to Sit: 4: Min assist;With rails;HOB elevated Sitting - Scoot to [place] of Bed: 4: Min assist Details for Bed Mobility Assistance: pt pulls on PT's arm to A with bringing trunk up to sit.   Transfers Transfers: Sit to Stand;Stand to Sit Sit to Stand: 4: Min guard;With upper extremity assist;From bed (From [phone] requires MinA) Stand to Sit: 4: Min guard;With upper extremity assist;To chair/3-in-1 (MinA to 5HQ) Details for Transfer Assistance: Pt required min assist for sit<>stand from rollator, to keep it from sliding out from under him.   Ambulation/Gait Ambulation/Gait Assistance: 4: Min assist Ambulation Distance (Feet): 80 Feet Assistive device: Rollator Ambulation/Gait Assistance Details: cues for safe use of 4WW, staying closer to 4WW, use of brakes, use of UEs, upright posture.  Attempted to get O2 sats x3 even after soaking pt's hand in warm water and unable to get ready with 2 different pulse oxs.   Gait Pattern: Step-through pattern;Decreased stride length;Decreased dorsiflexion - left;Decreased dorsiflexion - right;Narrow base of support Stairs: No Wheelchair Mobility Wheelchair Mobility: No    Exercises     PT Diagnosis:    PT Problem List:   PT Treatment Interventions:     PT Goals (current goals can now be found in the care plan section) Acute Rehab PT Goals Time For Goal Achievement: 07/12/13 Potential to Achieve Goals: Fair  Visit Information  Last PT Received On: 07/10/13 Assistance Needed: +1 PT/OT Co-Evaluation/Treatment: Yes History of Present Illness: Pt is a 79 y/o male admitted after a fall with inability to get up. Per chart review, pt was on the floor for a day and a half before his aide found him and called 911. Pt subsequently with rhabdomyolysis as a result of dehydration.     Subjective Data      Cognition  Cognition Arousal/Alertness: Awake/alert Behavior During Therapy: WFL for tasks assessed/performed Overall Cognitive Status: Impaired/Different from baseline Area of Impairment: Safety/judgement Safety/Judgement: Decreased awareness of safety;Decreased awareness of deficits General Comments: Pt refusing SNF  and feels he is able to manage at home with friend as caregiver--I would disagree--pt has very poor endurance and will need alot of A post D/C.    Balance  Balance Balance Assessed: Yes Static Standing Balance Static Standing - Balance Support: Bilateral upper extremity supported Static Standing - Level of Assistance: 4: Min assist  End of Session PT - End of  Session Equipment Utilized During Treatment: Gait belt;Oxygen Activity Tolerance: Patient limited by fatigue Patient left: in chair;with call bell/phone within reach Nurse Communication: Mobility status   GP     Sunny Schlein, [place] [phone] 07/10/2013, 10:07 AM

## 2013-07-10 NOTE — Progress Notes (Signed)
Occupational Therapy Treatment Patient Details Name: Wesley [name] MRN: [id number] DOB: 09/11/1920 Today's Date: 07/10/2013 Time: 8295-6213 OT Time Calculation (min): 39 min  OT Assessment / Plan / Recommendation  History of present illness Pt is a 79 y/o male admitted after a fall with inability to get up. Per chart review, pt was on the floor for a day and a half before his aide found him and called 911. Pt subsequently with rhabdomyolysis as a result of dehydration.    OT comments  Pt has made progress since Friday, but still not at the level he needs to be and be able to go home and safely manage (pt has stated a number of times that he will have someone at home with him; however how much is unclear and what level is unclear). He is not safe to be alone at all and at present he needs also to be able to manage O2 lines.  Follow Up Recommendations  SNF       Equipment Recommendations  None recommended by OT       Frequency Min 2X/week   Progress towards OT Goals Progress towards OT goals: Not progressing toward goals - comment (Doing better than Friday, but still not at goal level)  Plan Discharge plan remains appropriate    Precautions / Restrictions Precautions Precautions: Fall Restrictions Weight Bearing Restrictions: No   Pertinent Vitals/Pain At beginning of session he was 97% on 2.5 liters; however after this we were unable to get a reading the rest of the session even after trying to warm up his hands. Started out ambulating with pt on 2 liters of O2 and pt doing well, we did remove his O2 the last 30 feet and you could hear his DOE. Returned him to O2 at 2 liters back in his room    ADL  Toilet Transfer: Hydrographic surveyor Method: Sit to Barista:  (Bed>out door and down hall>back to sink then recliner) Equipment Used: Gait belt (rollator) Transfers/Ambulation Related to ADLs: Min guard A for all with rollator ADL Comments: Pt reported that  he did not want to try and brush his teeth at the sink he just want to "keep going" (walking). Did work with pt on safe use of rollator (which is quite unsafe with as far as how to lock, unlock, turn around to, and cannot manage his O2 line with. He needs constant VC's for all transitions.      OT Goals(current goals can now be found in the care plan section)    Visit Information  Last OT Received On: 07/10/13 Assistance Needed: +1 PT/OT Co-Evaluation/Treatment: Yes History of Present Illness: Pt is a 79 y/o male admitted after a fall with inability to get up. Per chart review, pt was on the floor for a day and a half before his aide found him and called 911. Pt subsequently with rhabdomyolysis as a result of dehydration.           Cognition  Cognition Arousal/Alertness: Awake/alert Behavior During Therapy: WFL for tasks assessed/performed Overall Cognitive Status: Impaired/Different from baseline Area of Impairment: Safety/judgement Safety/Judgement: Decreased awareness of safety;Decreased awareness of deficits General Comments: Pt refusing SNF and feels he is able to manage at home with friend as caregiver--I would disagree--pt has very poor endurance and will need alot of A post D/C.    Mobility  Bed Mobility Bed Mobility: Supine to Sit;Sitting - Scoot to Edge of Bed Supine to Sit: 4: Min assist;With rails;HOB  elevated Sitting - Scoot to Edge of Bed: 4: Min assist Details for Bed Mobility Assistance: pt pulls on PT's arm to A with bringing trunk up to sit.   Transfers Sit to Stand: 4: Min guard;With upper extremity assist;From bed (From [phone] requires MinA) Stand to Sit: 4: Min guard;With upper extremity assist;To chair/3-in-1 (MinA to 0RU) Details for Transfer Assistance: Pt required min assist for sit<>stand from rollator, to keep it from sliding out from under him.        Balance Balance Balance Assessed: Yes Static Standing Balance Static Standing - Balance Support:  Bilateral upper extremity supported Static Standing - Level of Assistance: 4: Min assist   End of Session OT - End of Session Equipment Utilized During Treatment: Gait belt;Oxygen (rollator) Activity Tolerance: Patient tolerated treatment well Patient left: in chair;with call bell/phone within reach Nurse Communication:  (IV leaking)       Evette Georges [phone] 07/10/2013, 10:19 AM

## 2013-07-10 NOTE — Progress Notes (Signed)
  Echocardiogram 2D Echocardiogram has been performed.  Wesley [name] 07/10/2013, 4:52 PM

## 2013-07-11 ENCOUNTER — Inpatient Hospital Stay (HOSPITAL_COMMUNITY): Payer: Medicare Other

## 2013-07-11 LAB — RENAL FUNCTION PANEL
Albumin: 2 g/dL — ABNORMAL LOW (ref 3.5–5.2)
BUN: 21 mg/dL (ref 6–23)
GFR calc Af Amer: 35 mL/min — ABNORMAL LOW (ref >90)
Phosphorus: 2.7 mg/dL (ref 2.3–4.6)
Potassium: 3.4 mEq/L — ABNORMAL LOW (ref 3.5–5.1)
Sodium: 136 mEq/L (ref 135–145)

## 2013-07-11 LAB — MAGNESIUM: Magnesium: 1.5 mg/dL (ref 1.5–2.5)

## 2013-07-11 MED ORDER — STARCH (THICKENING) PO POWD
ORAL | Status: DC | PRN
  Filled 2013-07-11: qty 227

## 2013-07-11 MED ORDER — RESOURCE THICKENUP CLEAR PO POWD
ORAL | Status: DC | PRN
  Filled 2013-07-11: qty 125

## 2013-07-11 NOTE — Progress Notes (Addendum)
BP 91/54, HR 99. Held 2200 metoprolol & MD notified. No new orders at this time.

## 2013-07-11 NOTE — Progress Notes (Signed)
Physical Therapy Treatment Patient Details Name: Wesley [name] MRN: [id number] DOB: June 11, 1920 Today's Date: 07/11/2013 Time: 2841-3244 PT Time Calculation (min): 20 min  PT Assessment / Plan / Recommendation  History of Present Illness Pt is a 79 y/o male admitted after a fall with inability to get up. Per chart review, pt was on the floor for a day and a half before his aide found him and called 911. Pt subsequently with rhabdomyolysis as a result of dehydration.    PT Comments   Pt is progressing well with gait and mobility.  Continues to need constant assist when up OOB to be safe with mobility.  He is at very high risk of falls without 24/7 assist.  He is adamantly refusing SNF for rehab and wants to go home.    Follow Up Recommendations  SNF     Does the patient have the potential to tolerate intense rehabilitation    NA  Barriers to Discharge   None      Equipment Recommendations  None recommended by PT    Recommendations for Other Services   None  Frequency Min 3X/week   Progress towards PT Goals Progress towards PT goals: Progressing toward goals  Plan Current plan remains appropriate    Precautions / Restrictions Precautions Precautions: Fall   Pertinent Vitals/Pain See vitals flow sheet.     Mobility  Bed Mobility Supine to Sit: 4: Min assist;With rails;HOB elevated Sitting - Scoot to Edge of Bed: 4: Min assist;With rail Details for Bed Mobility Assistance: min assist to support trunk during transition to sit.  Transfers Sit to Stand: 4: Min guard;With upper extremity assist;With armrests;From bed Stand to Sit: 4: Min guard;With upper extremity assist;With armrests;To bed Details for Transfer Assistance: pt required assist for balance and stability during transitions Ambulation/Gait Ambulation/Gait Assistance: 4: Min guard Ambulation Distance (Feet): 80 Feet Assistive device: Rollator Ambulation/Gait Assistance Details: cues for upright posture and to stay  closer to RW, especially when turning around.  Pt likely has flexed posture at baseline.   Gait Pattern: Step-through pattern;Shuffle;Trunk flexed      PT Goals (current goals can now be found in the care plan section) Acute Rehab PT Goals Patient Stated Goal: to return home PT Goal Formulation: With patient Time For Goal Achievement: 07/25/13 Potential to Achieve Goals: Good  Visit Information  Last PT Received On: 07/11/13 Assistance Needed: +1 History of Present Illness: Pt is a 79 y/o male admitted after a fall with inability to get up. Per chart review, pt was on the floor for a day and a half before his aide found him and called 911. Pt subsequently with rhabdomyolysis as a result of dehydration.     Subjective Data  Subjective: Pt agreeable to walk in hallway, but did not want to sit up in the chair Patient Stated Goal: to return home   Cognition  Cognition Arousal/Alertness: Awake/alert Behavior During Therapy: WFL for tasks assessed/performed Overall Cognitive Status: Within Functional Limits for tasks assessed (not specifically tested today)    Balance  Balance Balance Assessed: Yes Static Sitting Balance Static Sitting - Balance Support: Bilateral upper extremity supported;Feet supported Static Sitting - Level of Assistance: 5: Stand by assistance Static Standing Balance Static Standing - Balance Support: Bilateral upper extremity supported Static Standing - Level of Assistance: 4: Min assist  End of Session PT - End of Session Activity Tolerance: Patient limited by fatigue Patient left: in bed;with call bell/phone within reach     Friedens B. Aaisha Sliter,  PT, DPT [phone]   07/11/2013, 4:33 PM

## 2013-07-11 NOTE — Progress Notes (Signed)
I have seen and examined this patient and agree with plan as outlined by Dr.Qureshi.  Agree with current plan.  Home with foley catheter and outpt urology f/u.  Will not need renal follow up unless his Scr does not return to his baseline as an outpt.  Will sign off.  Please call with questions/concerns. Gayle Martinez A,MD 07/11/2013 9:52 AM

## 2013-07-11 NOTE — Progress Notes (Signed)
[facility] ROUNDING NOTE   Subjective:   Wesley [name] was seen and examined at bedside.  He is very frustrated and wishes to go home.  I spoke to his grandson, Wesley [name], who is also his POA on the phone this morning who was asking for an update.  He says he does not get off work until [employer] and will try to come by again today.     Objective:  Vital signs in last 24 hours:  Temp:  [98.2 F (36.8 C)-98.6 F (37 C)] 98.2 F (36.8 C) (08/19 0603) Pulse Rate:  [96-100] 98 (08/19 0603) Resp:  [18-20] 20 (08/19 0603) BP: (100-121)/(49-78) 121/49 mmHg (08/19 0603) SpO2:  [95 %-99 %] 95 % (08/19 0603) Weight:  [152 lb 1.9 oz (69 kg)] 152 lb 1.9 oz (69 kg) (08/19 0610)  Weight change: -14 lb 15.9 oz (-6.8 kg) Filed Weights   07/09/13 2118 07/10/13 0500 07/11/13 0610  Weight: 167 lb 1.7 oz (75.8 kg) 167 lb 1.7 oz (75.8 kg) 152 lb 1.9 oz (69 kg)    Intake/Output: I/O last 3 completed shifts: In: 1878.8 [P.O.:120; I.V.:1758.8] Out: 3100 [Urine:3100]   Intake/Output this shift:     Physical Exam:  Vitals reviewed.  General: resting in bed, NAD, cachectic  HEENT: PERRL, EOMI  Cardiac: RRR  Pulm: coarse b/l breath sounds  Abd: soft, +bs, nontender, well healed midline surgical scar. Foley in place. Ext: cool, tenderness to palpation, darkened skin, LLL C/D/I dressing, tenderness to palpation, moving all 4 extremities  Neuro: alert and oriented X3, strength 3/5 b/l upper and lower extremities, and decreased sensation b/l lower extremities  Basic Metabolic Panel:  Recent Labs Lab 07/04/13 2118  07/07/13 0620 07/08/13 0715 07/09/13 0548 07/10/13 0540 07/11/13 0500  NA  --   < > 137 137 136 136 136  K  --   < > 3.1* 5.1 4.6 4.1 3.4*  CL  --   < > 98 100 100 99 99  CO2  --   < > 24 25 24 25 24   GLUCOSE  --   < > 123* 113* 104* 88 86  BUN  --   < > 9 14 22  25* 21  CREATININE  --   < > 1.06 1.90* 2.75* 2.39* 1.85*  CALCIUM  --   < > 8.6 9.4 8.8 8.7 8.5  MG 1.4*  --   --    --   --  1.6 1.5  PHOS  --   --   --   --   --  2.9 2.7  < > = values in this interval not displayed.  Liver Function Tests:  Recent Labs Lab 07/04/13 1121 07/05/13 0610 07/10/13 0540 07/11/13 0500  AST 167* 149*  --   --   ALT 50 43  --   --   ALKPHOS 77 63  --   --   BILITOT 2.3* 1.3*  --   --   PROT 7.5 5.9*  --   --   ALBUMIN 3.2* 2.5* 2.2* 2.0*    Recent Labs Lab 07/04/13 1121  LIPASE 13   CBC:  Recent Labs Lab 07/04/13 1121 07/05/13 0610 07/07/13 0620 07/10/13 0540  WBC 6.0 6.5 9.0 5.1  NEUTROABS 5.5  --  8.2*  --   HGB 13.6 11.4* 11.9* 10.1*  HCT 38.5* 31.8* 33.8* 28.3*  MCV 103.2* 101.9* 102.7* 102.9*  PLT 128* 113* 150 148*   Cardiac Enzymes:  Recent Labs Lab 07/04/13 1121  07/04/13 2118 07/05/13 0610 07/07/13 0620 07/08/13 0715 07/09/13 0548  CKTOTAL 7169*  --  4465* 1745* 753* 320*  TROPONINI <0.30 <0.30 <0.30  --   --   --    Microbiology: Results for orders placed during the hospital encounter of 02/14/11  URINE CULTURE     Status: None   Collection Time    02/14/11 10:55 PM      Result Value Range Status   Specimen Description URINE, RANDOM   Final   Special Requests NONE   Final   Culture  Setup Time [id number]   Final   Colony Count >=100,000 COLONIES/ML   Final   Culture ESCHERICHIA COLI   Final   Report Status 02/17/2011 FINAL   Final   Organism ID, Bacteria ESCHERICHIA COLI   Final  CULTURE, BLOOD (ROUTINE X 2)     Status: None   Collection Time    02/14/11 11:23 PM      Result Value Range Status   Specimen Description BLOOD ARM RIGHT   Final   Special Requests BOTTLES DRAWN AEROBIC ONLY 10CC   Final   Culture  Setup Time [id number]   Final   Culture NO GROWTH 5 DAYS   Final   Report Status 02/21/2011 FINAL   Final  CULTURE, BLOOD (ROUTINE X 2)     Status: None   Collection Time    02/14/11 11:28 PM      Result Value Range Status   Specimen Description BLOOD ARM LEFT   Final   Special Requests BOTTLES DRAWN  AEROBIC ONLY 5CC   Final   Culture  Setup Time [id number]   Final   Culture NO GROWTH 5 DAYS   Final   Report Status 02/21/2011 FINAL   Final  MRSA PCR SCREENING     Status: None   Collection Time    02/15/11  4:45 AM      Result Value Range Status   MRSA by PCR    NEGATIVE Final   Value: NEGATIVE            The GeneXpert MRSA Assay (FDA     approved for NASAL specimens     only), is one component of a     comprehensive MRSA colonization     surveillance program. It is not     intended to diagnose MRSA     infection nor to guide or     monitor treatment for     MRSA infections.   Urinalysis:  Recent Labs  07/09/13 1300  COLORURINE YELLOW  LABSPEC 1.006  PHURINE 7.0  GLUCOSEU NEGATIVE  HGBUR TRACE*  BILIRUBINUR NEGATIVE  KETONESUR NEGATIVE  PROTEINUR NEGATIVE  UROBILINOGEN 0.2  NITRITE NEGATIVE  LEUKOCYTESUR NEGATIVE    Imaging: Ct Abdomen Pelvis Wo Contrast  07/10/2013   *RADIOLOGY REPORT*  Clinical Data: Elevated prostate specific antigen and  CT ABDOMEN AND PELVIS WITHOUT CONTRAST  Technique:  Multidetector CT imaging of the abdomen and pelvis was performed following the standard protocol without intravenous contrast.  Comparison: 06/17/2005  Findings: There is consolidation posteriorly in the right lower lobe.  This appears greater than what would be expected for atelectasis alone and there are air bronchograms present.  There is mild emphysematous change at the lung bases with scarring on the right and a 3 cm bleb on the left.  There are no acute musculoskeletal findings.  Liver is normal.  Gallbladder is surgically absent.  Spleen and pancreas are normal.  Adrenal glands and  kidneys show no acute findings. Right kidney is normal.  The left kidney appears normal, but there is mild prominence of both ureters, slightly greater on the left.  There is a 2 mm stone in the distal left ureter and there is a 3 mm stone at the left ureteral vesicle junction. There is a Foley  catheter within the bladder.  The bladder is decompressed.  Bladder wall appears thickened but this may be due to the presence of the Foley balloon.  There is calcification of the abdominal aorta.  Distal abdominal aorta is dilated to 30 x 28 mm, stable from prior studies.  Bowel is normal except for diverticulosis of the sigmoid colon. There is no free fluid.  There are no acute musculoskeletal findings.  Prostate measures 52 x 50 x45 mm.  On the prior study it measured about 45 x 37 mm.  IMPRESSION:  1.  At least two small stones in the distal left ureter appear to cause mild hydronephrosis 2.  Sigmoid colon diverticulosis 3.  Prostate measures 52 x 50 x 45 mm, representing mild enlargement when compared to the prior study.  4.  Right lower lobe consolidation concerning for pneumonitis. 5.  Stable aortic aneurysm   Original Report Authenticated By: Esperanza Heir, M.D.   Dg Chest 2 View  07/11/2013   *RADIOLOGY REPORT*  Clinical Data: Follow up pulmonary edema.  Short of breath.  CHEST - 2 VIEW  Comparison: 07/09/2013  Findings: No significant change bibasilar airspace disease and bilateral effusion.  There is underlying COPD with hyperinflation and pulmonary scarring.  IMPRESSION: No significant change bibasilar airspace disease in bilateral pleural effusion.  This may be due to fluid overload.  Pneumonia could also be present.   Original Report Authenticated By: Janeece Riggers, M.D.   US Renal  07/09/2013   *RADIOLOGY REPORT*  Clinical Data: Acute renal failure  RENAL/URINARY TRACT ULTRASOUND  Technique: Renal ultrasound  Comparison:   CT scan abdomen 06/06/2009  Findings: Right kidney measures 5.7 cm in length.  There is mild right hydronephrosis.  Proximal right hydroureter which measures 9.8 mm in diameter.  No diagnostic renal calculus.  Left kidney measures 10 cm in length.  Mild left hydronephrosis.  Bilateral increased cortical renal echogenicity probable due to medical renal disease or atrophy.  A  distended urinary bladder is noted.  The patient states is not able to void.  Clinical correlation is necessary to exclude a bladder outlet obstruction.  The urinary bladder measures 846 ml.  IMPRESSION:  1.  Bilateral hydronephrosis.  No diagnostic renal calculus.  Mild proximal right hydroureter. 2.  Bilateral echogenic kidney probable due to atrophy or chronic medical renal disease. 3.  Distended urinary bladder.  Clinical correlation is necessary to exclude bladder outlet obstruction.  Urinary bladder measures 846 ml.   Original Report Authenticated By: Natasha Mead, M.D.   Dg Chest Port 1 View  07/09/2013   *RADIOLOGY REPORT*  Clinical Data: Cough and congestion  PORTABLE CHEST - 1 VIEW  Comparison:  July 05, 2013  Findings:  There is interstitial and patchy alveolar edema in the lower lung zones bilaterally.  There are small effusions bilaterally, slightly larger on the left than the right.  Heart is upper normal in size.  The pulmonary vascularity is indicative of underlying emphysema.  There is consolidation in the left base medially.  No pneumothorax.  No adenopathy.  There is atherosclerotic change in the aorta.  IMPRESSION: Congestive heart failure superimposed on underlying emphysema.  Consolidation  medial left base.  Suspect superimposed pneumonia in this area.   Original Report Authenticated By: Bretta Bang, M.D.   Medications:     . amLODipine  5 mg Oral Daily  . antiseptic oral rinse  15 mL Mouth Rinse BID  . aspirin EC  81 mg Oral Daily  . enoxaparin (LOVENOX) injection  30 mg Subcutaneous Q24H  . feeding supplement  237 mL Oral BID BM  . hydrocerin   Topical BID  . metoprolol tartrate  25 mg Oral BID  . mupirocin ointment   Topical BID  . potassium chloride  40 mEq Oral Once  . sodium chloride  3 mL Intravenous Q12H   albuterol, alum & mag hydroxide-simeth, LORazepam  Assessment/ Plan:  Wesley [name] is a 79 y.o. African American male with a PMHX of PVD s/p stents, elevated  PSA/prostatitis, malignant neoplasm of larynx, HTN, PUD with hx of hemorrhage, and OA, who was admitted to [facility] on 07/04/2013 s/p fall for rhabdomyolysis, lactic acidosis, and CAP. CK was trending down with IVF, however, developed oliguria with acute renal failure.   Acute renal failure--improving, Cr trending down.  2/2 obstructive etiology with hx of BPH.  Improving. Renal ultrasound significant for b/l hydronephrosis, mild proximal right hydroureter, b/l echogenic kidney, and distended bladder.  Admitted for rhabdomyolysis initially. No hx of CKD. Cr on admission 1.03.  CT abdomen done 07/10/13: 2 small stones distal L ureter with mild hydronephrosis, sigmoid diverticulosis, enlarged prostate, RLL consolidation, and stable aortic aneurysm. Net -2.15L since admission.   Recent Labs Lab 07/07/13 0620 07/08/13 0715 07/09/13 0548 07/10/13 0540 07/11/13 0500  CREATININE 1.06 1.90* 2.75* 2.39* 1.85*   -needs urology follow up -continue to trend renal function   CAP: off all antibiotics.  Was on Ceftin and Azithromycin RLL PNA. Afebrile with no leukocytosis. Was initially on Ceftriaxone and doxycycline.  -management per primary team  -will likely benefit from pulmonary PT  Case discussed and patient seen with Dr. Arrie Aran.     LOS: 7  Signed: Darden Palmer, MD PGY-2, Internal Medicine Resident Pager: [phone]  07/11/2013,8:21 AM

## 2013-07-11 NOTE — Progress Notes (Signed)
Subjective: Appreciate nephrology management. Note taken of contact with patient's POA and the patient's desire to go home. He has no complaints, denies chest pain, denies shortness of breath.  Objective: Lab:  Recent Labs  07/10/13 0540  WBC 5.1  HGB 10.1*  HCT 28.3*  MCV 102.9*  PLT 148*    Recent Labs  07/09/13 0548 07/10/13 0540 07/11/13 0500  NA 136 136 136  K 4.6 4.1 3.4*  CL 100 99 99  GLUCOSE 104* 88 86  BUN 22 25* 21  CREATININE 2.75* 2.39* 1.85*  CALCIUM 8.8 8.7 8.5  MG  --  1.6 1.5  PHOS  --  2.9 2.7    Imaging: 2 D echo: Study Conclusions  - Procedure narrative: Transthoracic echocardiography. Image quality was poor. The study was technically difficult, as a result of poor acoustic windows. - Left ventricle: Systolic function was normal. The estimated ejection fraction was in the range of 60% to 65%. Doppler parameters are consistent with abnormal left ventricular relaxation (grade 1 diastolic dysfunction). Doppler parameters are consistent with high ventricular filling pressure. - Pulmonary arteries: PA peak pressure: 41mm Hg (S).  CT abd/pelvis 8/18: IMPRESSION:  1. At least two small stones in the distal left ureter appear to  cause mild hydronephrosis  2. Sigmoid colon diverticulosis  3. Prostate measures 52 x 50 x 45 mm, representing mild  enlargement when compared to the prior study.  4. Right lower lobe consolidation concerning for pneumonitis.  5. Stable aortic aneurysm   Scheduled Meds: . amLODipine  5 mg Oral Daily  . antiseptic oral rinse  15 mL Mouth Rinse BID  . aspirin EC  81 mg Oral Daily  . enoxaparin (LOVENOX) injection  30 mg Subcutaneous Q24H  . feeding supplement  237 mL Oral BID BM  . hydrocerin   Topical BID  . metoprolol tartrate  25 mg Oral BID  . mupirocin ointment   Topical BID  . potassium chloride  40 mEq Oral Once  . sodium chloride  3 mL Intravenous Q12H   Continuous Infusions:  PRN Meds:.albuterol, alum &  mag hydroxide-simeth, LORazepam   Physical Exam: Filed Vitals:   07/11/13 0603  BP: 121/49  Pulse: 98  Temp: 98.2 F (36.8 C)  Resp: 20    Intake/Output Summary (Last 24 hours) at 07/11/13 0840 Last data filed at 07/11/13 1610  Gross per 24 hour  Intake    123 ml  Output   1500 ml  Net  -1377 ml   Total this adm: -2,159 Gen'l - skinny elderly AA man in no distress HEENT - temporal wasting, teeth - dentures are loose Neck - no JVD Cor - 2+ radial, RRR Pulm - normal respirations, no increased WOB, no rales or wheezes Abd - BS+ x 4 Ext - no deformity except for thinness.  Derm - non-healing abrasion distal left leg, no exudate Neuro - A&O x 3, CN II-XII grossly normal.     Assessment/Plan: 1. Rhabdo - resolved  2.  Falls - will need PT when home  3. CAP - mostly a radiologic finding. No leukocytosis, cough, fever. Completed 6 days rocephin and azithromycin. No respiratory distress  4. Renal - acute renal failure 2/2 bladder outlet obstruction 2/2 BPH with prostatitis vs prostate cancer. Good post obstructive diuresis with falling creatinine  Plan F/u lab  Will need to go home with catheter in place  Outpatient urology f/u  5. CHF - no respiratory distress. 2 D echo c/w diastolic dysfunction.  Plan Continue oral furosemide  Continue present medication  Outpatient f/u Dr. Alanda Amass [facility]  6. Swallow eval - work up in progress; for Modified barium swallow today.  Dispo - frail man but in his right mind who declines offer of SNF and insists on going home. Will need HH-RN/PT. F-F form done. Anticipate d/c in AM   [employer] [facility] (o) [phone]; (c) [phone] Call-grp - [facility]  Tele: [phone]  07/11/2013, 8:37 AM

## 2013-07-11 NOTE — Progress Notes (Signed)
Notified MD of BP & HR. Scheduled Metoprolol given per MD's orders. Will recheck BP & HR.

## 2013-07-11 NOTE — Procedures (Signed)
Objective Swallowing Evaluation: Modified Barium Swallowing Study  Patient Details  Name: Wesley [name] MRN: [id number] Date of Birth: 12-Apr-1920  Today's Date: 07/11/2013 Time: 1000-1040 SLP Time Calculation (min): 40 min  Past Medical History:  Past Medical History  Diagnosis Date  . Osteoarthrosis, unspecified whether generalized or localized, other specified sites   . Acute kidney failure with other specified pathological lesion in kidney   . Prostatitis   . Postsurgical percutaneous transluminal coronary angioplasty status     weintraub  . Cataract extraction status `  . Peripheral vascular disease, unspecified   . Personal history of malignant neoplasm of larynx   . Elevated prostate specific antigen (PSA)   . Gout, unspecified   . Acute peptic ulcer with hemorrhage   . Unspecified essential hypertension   . Hyperlipidemia    Past Surgical History:  Past Surgical History  Procedure Laterality Date  . Percutaneous translumional coronary angioplastty      stents to bilateral legs for PVD  . Cataract extraction    . Truncal vagotomy  06/2005  . Gastrojejunosotomy  06/2005  . Cholecystectomy  06/2005   HPI:  79 year old male admitted 07/04/13 s/p fall.  PMH significant for COPD, throat CA s/p radiation treatment.  RN contacted SLP re: choking episodes, sometimes on secretions.     Assessment / Plan / Recommendation Clinical Impression  Dysphagia Diagnosis: Moderate cervical esophageal phase dysphagia;Severe cervical esophageal phase dysphagia;Moderate pharyngeal phase dysphagia Clinical impression: Pt. exhibits a moderate-severe cervical esophageal dysphagia, with a very tight crichopharyngeal segment, that caused a 13mm barium tablet to lodge. Great effort and multiple swallows of liquids, puree, and pudding consistencies, to finally move the pill. While the pill was lodged, swallows of liquids and even pudding were aspirated.  Prior to the pill episode, pt. aspirated thin  liquids during the swallow due to reduced laryngeal elevation and decreased laryngeal closure.  Pt. did have a cough response to aspiration, but this did not clear the aspirate.  Pt. also coughed at other times, when there was no aspiration or penetration observed.  Head turn to right and chin tuck were attempted, but did not prevent aspiration.  There was no radiologist present to confirm the esophageal observations.  Question if pt. would be a candiate for dilitation of UES or possible botox injection.      Treatment Recommendation  Therapy as outlined in treatment plan below    Diet Recommendation Dysphagia 1 (Puree);Nectar-thick liquid   Liquid Administration via: No straw Medication Administration: Crushed with puree Supervision: Patient able to self feed;Full supervision/cueing for compensatory strategies Compensations: Slow rate;Small sips/bites Postural Changes and/or Swallow Maneuvers: Seated upright 90 degrees;Upright 30-60 min after meal    Other  Recommendations Recommended Consults: Consider ENT evaluation Oral Care Recommendations: Oral care QID Other Recommendations: Have oral suction available;Clarify dietary restrictions   Follow Up Recommendations  24 hour supervision/assistance    Frequency and Duration min 2x/week  2 weeks   Pertinent Vitals/Pain n/a    SLP Swallow Goals Patient will consume recommended diet without observed clinical signs of aspiration with: Moderate assistance Patient will utilize recommended strategies during swallow to increase swallowing safety with: Moderate assistance   General HPI: 79 year old male admitted 07/04/13 s/p fall.  PMH significant for COPD, throat CA s/p radiation treatment.  RN contacted SLP re: choking episodes, sometimes on secretions. Type of Study: Modified Barium Swallowing Study Reason for Referral: Objectively evaluate swallowing function Previous Swallow Assessment: BSE 8/17 Diet Prior to this Study:  Thin liquids (Full  Liquids) Temperature Spikes Noted: No Respiratory Status: Supplemental O2 delivered via (comment) History of Recent Intubation: No Behavior/Cognition: Alert;Cooperative;Pleasant mood Oral Cavity - Dentition: Dentures, top Oral Motor / Sensory Function: Within functional limits Self-Feeding Abilities: Able to feed self Patient Positioning: Upright in chair Baseline Vocal Quality: Clear Volitional Cough: Weak Volitional Swallow: Unable to elicit Anatomy: Other (Comment) (s/p radiation treatment for cancer) Pharyngeal Secretions: Not observed secondary MBS    Reason for Referral Objectively evaluate swallowing function   Oral Phase Oral Preparation/Oral Phase Oral Phase: Impaired Oral - Solids Oral - Regular: Impaired mastication;Delayed oral transit (No lower teeth)   Pharyngeal Phase Pharyngeal Phase Pharyngeal Phase: Impaired Pharyngeal - Thin Pharyngeal - Thin Teaspoon: Delayed swallow initiation;Premature spillage to pyriform sinuses;Reduced epiglottic inversion;Reduced anterior laryngeal mobility;Reduced laryngeal elevation;Reduced airway/laryngeal closure;Penetration/Aspiration during swallow Penetration/Aspiration details (thin teaspoon): Material enters airway, remains ABOVE vocal cords and not ejected out Pharyngeal - Thin Cup: Delayed swallow initiation;Reduced epiglottic inversion;Reduced anterior laryngeal mobility;Reduced laryngeal elevation;Reduced airway/laryngeal closure;Penetration/Aspiration during swallow;Pharyngeal residue - cp segment Penetration/Aspiration details (thin cup): Material enters airway, passes BELOW cords and not ejected out despite cough attempt by patient Pharyngeal - Solids Pharyngeal - Pill: Reduced anterior laryngeal mobility;Reduced laryngeal elevation;Reduced tongue base retraction;Pharyngeal residue - cp segment  Cervical Esophageal Phase    GO    Cervical Esophageal Phase Cervical Esophageal Phase: Impaired Cervical Esophageal Phase -  Solids Pill: Reduced cricopharyngeal relaxation;Prominent cricopharyngeal segment Cervical Esophageal Phase - Comment Cervical Esophageal Comment: The 13mm tablet lodged at the level of the CP segment and finally cleared after multiple swallows with liquids, puree, and then pudding consistencies and effortful swallows (likely after it begain to dissolve).           Maryjo Rochester T 07/11/2013, 12:54 PM

## 2013-07-11 NOTE — Progress Notes (Signed)
OT Cancellation Note  Patient Details Name: Wesley [name] MRN: [id number] DOB: 1920-06-05   Cancelled Treatment:    Reason Eval/Treat Not Completed: Patient at procedure or test/ unavailable. Will check back as schedule allows.  Lennox Laity [phone] 07/11/2013, 10:19 AM

## 2013-07-12 ENCOUNTER — Inpatient Hospital Stay (HOSPITAL_COMMUNITY)
Admission: EM | Admit: 2013-07-12 | Discharge: 2013-07-17 | Disposition: A | Discharge status: Skilled Nursing Facility | Payer: Medicare Other | Source: Home / Self Care | Attending: Internal Medicine | Admitting: Internal Medicine

## 2013-07-12 ENCOUNTER — Emergency Department (HOSPITAL_COMMUNITY): Payer: Medicare Other

## 2013-07-12 ENCOUNTER — Encounter (HOSPITAL_COMMUNITY): Payer: Self-pay | Admitting: Emergency Medicine

## 2013-07-12 DIAGNOSIS — Q782 Osteopetrosis: Secondary | ICD-10-CM

## 2013-07-12 DIAGNOSIS — N19 Unspecified kidney failure: Secondary | ICD-10-CM | POA: Diagnosis present

## 2013-07-12 DIAGNOSIS — Z681 Body mass index (BMI) 19 or less, adult: Secondary | ICD-10-CM

## 2013-07-12 DIAGNOSIS — I739 Peripheral vascular disease, unspecified: Secondary | ICD-10-CM

## 2013-07-12 DIAGNOSIS — I509 Heart failure, unspecified: Secondary | ICD-10-CM | POA: Diagnosis present

## 2013-07-12 DIAGNOSIS — Z66 Do not resuscitate: Secondary | ICD-10-CM | POA: Diagnosis present

## 2013-07-12 DIAGNOSIS — M1990 Unspecified osteoarthritis, unspecified site: Secondary | ICD-10-CM | POA: Diagnosis present

## 2013-07-12 DIAGNOSIS — E876 Hypokalemia: Secondary | ICD-10-CM | POA: Diagnosis not present

## 2013-07-12 DIAGNOSIS — I959 Hypotension, unspecified: Secondary | ICD-10-CM | POA: Diagnosis present

## 2013-07-12 DIAGNOSIS — E785 Hyperlipidemia, unspecified: Secondary | ICD-10-CM | POA: Diagnosis present

## 2013-07-12 DIAGNOSIS — E860 Dehydration: Secondary | ICD-10-CM

## 2013-07-12 DIAGNOSIS — R5381 Other malaise: Secondary | ICD-10-CM

## 2013-07-12 DIAGNOSIS — R972 Elevated prostate specific antigen [PSA]: Secondary | ICD-10-CM

## 2013-07-12 DIAGNOSIS — N401 Enlarged prostate with lower urinary tract symptoms: Secondary | ICD-10-CM | POA: Diagnosis present

## 2013-07-12 DIAGNOSIS — Z7982 Long term (current) use of aspirin: Secondary | ICD-10-CM

## 2013-07-12 DIAGNOSIS — R259 Unspecified abnormal involuntary movements: Secondary | ICD-10-CM

## 2013-07-12 DIAGNOSIS — D696 Thrombocytopenia, unspecified: Secondary | ICD-10-CM

## 2013-07-12 DIAGNOSIS — I10 Essential (primary) hypertension: Secondary | ICD-10-CM | POA: Diagnosis present

## 2013-07-12 DIAGNOSIS — I5031 Acute diastolic (congestive) heart failure: Secondary | ICD-10-CM | POA: Diagnosis present

## 2013-07-12 DIAGNOSIS — M6282 Rhabdomyolysis: Principal | ICD-10-CM

## 2013-07-12 DIAGNOSIS — M109 Gout, unspecified: Secondary | ICD-10-CM

## 2013-07-12 DIAGNOSIS — H18419 Arcus senilis, unspecified eye: Secondary | ICD-10-CM | POA: Diagnosis present

## 2013-07-12 DIAGNOSIS — I5033 Acute on chronic diastolic (congestive) heart failure: Secondary | ICD-10-CM

## 2013-07-12 DIAGNOSIS — N320 Bladder-neck obstruction: Secondary | ICD-10-CM | POA: Diagnosis present

## 2013-07-12 DIAGNOSIS — Z85819 Personal history of malignant neoplasm of unspecified site of lip, oral cavity, and pharynx: Secondary | ICD-10-CM

## 2013-07-12 DIAGNOSIS — J9819 Other pulmonary collapse: Secondary | ICD-10-CM | POA: Diagnosis present

## 2013-07-12 DIAGNOSIS — J189 Pneumonia, unspecified organism: Secondary | ICD-10-CM | POA: Diagnosis present

## 2013-07-12 DIAGNOSIS — N178 Other acute kidney failure: Secondary | ICD-10-CM | POA: Diagnosis present

## 2013-07-12 DIAGNOSIS — R531 Weakness: Secondary | ICD-10-CM

## 2013-07-12 DIAGNOSIS — F17200 Nicotine dependence, unspecified, uncomplicated: Secondary | ICD-10-CM | POA: Diagnosis present

## 2013-07-12 DIAGNOSIS — N138 Other obstructive and reflux uropathy: Secondary | ICD-10-CM | POA: Diagnosis present

## 2013-07-12 DIAGNOSIS — R64 Cachexia: Secondary | ICD-10-CM | POA: Diagnosis present

## 2013-07-12 DIAGNOSIS — R627 Adult failure to thrive: Secondary | ICD-10-CM | POA: Diagnosis present

## 2013-07-12 DIAGNOSIS — Z9861 Coronary angioplasty status: Secondary | ICD-10-CM

## 2013-07-12 DIAGNOSIS — J438 Other emphysema: Secondary | ICD-10-CM | POA: Diagnosis present

## 2013-07-12 DIAGNOSIS — Z79899 Other long term (current) drug therapy: Secondary | ICD-10-CM

## 2013-07-12 LAB — URINALYSIS, ROUTINE W REFLEX MICROSCOPIC
Glucose, UA: NEGATIVE mg/dL
Leukocytes, UA: NEGATIVE
Protein, ur: 30 mg/dL — AB
Specific Gravity, Urine: 1.014 (ref 1.005–1.030)
Urobilinogen, UA: 0.2 mg/dL (ref 0.0–1.0)

## 2013-07-12 LAB — CBC WITH DIFFERENTIAL/PLATELET
Basophils Absolute: 0.1 10*3/uL (ref 0.0–0.1)
Eosinophils Absolute: 0 10*3/uL (ref 0.0–0.7)
Hemoglobin: 9.5 g/dL — ABNORMAL LOW (ref 13.0–17.0)
Lymphocytes Relative: 14 % (ref 12–46)
MCH: 36.5 pg — ABNORMAL HIGH (ref 26.0–34.0)
MCHC: 36.5 g/dL — ABNORMAL HIGH (ref 30.0–36.0)
Monocytes Absolute: 0.5 10*3/uL (ref 0.1–1.0)
Neutrophils Relative %: 77 % (ref 43–77)
Platelets: 276 10*3/uL (ref 150–400)
RDW: 13.3 % (ref 11.5–15.5)

## 2013-07-12 LAB — COMPREHENSIVE METABOLIC PANEL
ALT: 21 U/L (ref 0–53)
Albumin: 2.3 g/dL — ABNORMAL LOW (ref 3.5–5.2)
Alkaline Phosphatase: 47 U/L (ref 39–117)
Calcium: 8.3 mg/dL — ABNORMAL LOW (ref 8.4–10.5)
GFR calc Af Amer: 45 mL/min — ABNORMAL LOW (ref >90)
Potassium: 4.7 mEq/L (ref 3.5–5.1)
Sodium: 134 mEq/L — ABNORMAL LOW (ref 135–145)
Total Protein: 6.6 g/dL (ref 6.0–8.3)

## 2013-07-12 LAB — URINE MICROSCOPIC-ADD ON

## 2013-07-12 LAB — CK
Total CK: 98 U/L (ref 7–232)
Total CK: 111 U/L (ref 7–232)

## 2013-07-12 LAB — LACTIC ACID, PLASMA: Lactic Acid, Venous: 1.4 mmol/L (ref 0.5–2.2)

## 2013-07-12 LAB — PRO B NATRIURETIC PEPTIDE: Pro B Natriuretic peptide (BNP): 2941 pg/mL — ABNORMAL HIGH (ref 0–450)

## 2013-07-12 LAB — BASIC METABOLIC PANEL
CO2: 30 mEq/L (ref 19–32)
Calcium: 8.6 mg/dL (ref 8.4–10.5)
GFR calc Af Amer: 46 mL/min — ABNORMAL LOW (ref >90)
Sodium: 136 mEq/L (ref 135–145)

## 2013-07-12 MED ORDER — ONDANSETRON HCL 4 MG/2ML IJ SOLN: 4.0000 mg | Freq: Four times a day (QID) | INTRAMUSCULAR | Status: DC | PRN

## 2013-07-12 MED ORDER — ACETAMINOPHEN 650 MG RE SUPP: 650.0000 mg | Freq: Four times a day (QID) | RECTAL | Status: DC | PRN

## 2013-07-12 MED ORDER — ACETAMINOPHEN 325 MG PO TABS
650.0000 mg | ORAL_TABLET | Freq: Four times a day (QID) | ORAL | Status: DC | PRN
  Administered 2013-07-13 – 2013-07-16 (×2): 650 mg via ORAL
  Filled 2013-07-12 (×2): qty 2

## 2013-07-12 MED ORDER — PIPERACILLIN-TAZOBACTAM 3.375 G IVPB 30 MIN
3.3750 g | Freq: Once | INTRAVENOUS | Status: AC
  Administered 2013-07-12: 3.375 g via INTRAVENOUS
  Filled 2013-07-12: qty 50

## 2013-07-12 MED ORDER — DEXTROSE 5 % IV SOLN
1.0000 g | INTRAVENOUS | Status: DC
  Administered 2013-07-12: 1 g via INTRAVENOUS
  Filled 2013-07-12 (×2): qty 1

## 2013-07-12 MED ORDER — ENSURE COMPLETE PO LIQD: 237.0000 mL | Freq: Two times a day (BID) | ORAL | Status: DC

## 2013-07-12 MED ORDER — VANCOMYCIN HCL IN DEXTROSE 1-5 GM/200ML-% IV SOLN
1000.0000 mg | Freq: Once | INTRAVENOUS | Status: AC
  Administered 2013-07-13: 1000 mg via INTRAVENOUS
  Filled 2013-07-12: qty 200

## 2013-07-12 MED ORDER — SODIUM CHLORIDE 0.9 % IJ SOLN
3.0000 mL | Freq: Two times a day (BID) | INTRAMUSCULAR | Status: DC
  Administered 2013-07-12 – 2013-07-17 (×3): 3 mL via INTRAVENOUS

## 2013-07-12 MED ORDER — VANCOMYCIN HCL IN DEXTROSE 750-5 MG/150ML-% IV SOLN
750.0000 mg | INTRAVENOUS | Status: DC
  Filled 2013-07-12: qty 150

## 2013-07-12 MED ORDER — ENSURE COMPLETE PO LIQD
237.0000 mL | Freq: Two times a day (BID) | ORAL | Status: DC
  Administered 2013-07-13 – 2013-07-17 (×8): 237 mL via ORAL

## 2013-07-12 MED ORDER — ONDANSETRON HCL 4 MG PO TABS: 4.0000 mg | ORAL_TABLET | Freq: Four times a day (QID) | ORAL | Status: DC | PRN

## 2013-07-12 MED ORDER — SODIUM CHLORIDE 0.9 % IV BOLUS (SEPSIS)
500.0000 mL | Freq: Once | INTRAVENOUS | Status: AC
  Administered 2013-07-12: 500 mL via INTRAVENOUS

## 2013-07-12 MED ORDER — STARCH (THICKENING) PO POWD
ORAL | Status: DC | PRN
  Filled 2013-07-12: qty 227

## 2013-07-12 MED ORDER — POTASSIUM CHLORIDE CRYS ER 10 MEQ PO TBCR: 10.0000 meq | EXTENDED_RELEASE_TABLET | Freq: Every day | ORAL | Status: DC

## 2013-07-12 MED ORDER — ASPIRIN EC 81 MG PO TBEC
81.0000 mg | DELAYED_RELEASE_TABLET | Freq: Every day | ORAL | Status: DC
  Administered 2013-07-13 – 2013-07-17 (×5): 81 mg via ORAL
  Filled 2013-07-12 (×5): qty 1

## 2013-07-12 MED ORDER — SODIUM CHLORIDE 0.9 % IJ SOLN
3.0000 mL | Freq: Two times a day (BID) | INTRAMUSCULAR | Status: DC
  Administered 2013-07-12 – 2013-07-17 (×9): 3 mL via INTRAVENOUS

## 2013-07-12 MED ORDER — STARCH (THICKENING) PO POWD: ORAL | Status: DC

## 2013-07-12 NOTE — Progress Notes (Signed)
Patient discharge teaching given, including activity, diet, follow-up appoints, and medications. Patient verbalized understanding of all discharge instructions. IV access was d/c'd. Vitals are stable. Skin is intact except as charted in most recent assessments. Pt to be escorted out by NT, to be driven home by caregiver.  

## 2013-07-12 NOTE — ED Provider Notes (Signed)
CSN: [id number]     Arrival date & time 07/12/13  1835 History     First MD Initiated Contact with Patient 07/12/13 1840     Chief Complaint  Patient presents with  . Generalized Body Aches   (Consider location/radiation/quality/duration/timing/severity/associated sxs/prior Treatment) HPI Pt lives alone at home. D/C'd from hospital today at noon for rhabdomyolysis after falling and lying on the floor 36 hours. Pt had questionable pneumonia on presentation and initially received abx but d/c'd because pt was asymptomatic and had normal WBC. Pt states that since his d/c he has had increased fatigue and generalized weakness. +subjective fever and chills. +cough with sputum production. No lower ext swelling or pain. Pt c/o abd pain which he states has been present for several days. No N/V/D/C.    Past Medical History  Diagnosis Date  . Osteoarthrosis, unspecified whether generalized or localized, other specified sites   . Acute kidney failure with other specified pathological lesion in kidney   . Prostatitis   . Postsurgical percutaneous transluminal coronary angioplasty status     weintraub  . Cataract extraction status `  . Peripheral vascular disease, unspecified   . Personal history of malignant neoplasm of larynx   . Elevated prostate specific antigen (PSA)   . Gout, unspecified   . Acute peptic ulcer with hemorrhage   . Unspecified essential hypertension   . Hyperlipidemia    Past Surgical History  Procedure Laterality Date  . Percutaneous translumional coronary angioplastty      stents to bilateral legs for PVD  . Cataract extraction    . Truncal vagotomy  06/2005  . Gastrojejunosotomy  06/2005  . Cholecystectomy  06/2005   Family History  Problem Relation Age of Onset  . Heart disease Father   . Prostate cancer Brother   . Heart disease Brother    History  Substance Use Topics  . Smoking status: Current Some Day Smoker -- 0.50 packs/day    Types: Cigarettes, Cigars   . Smokeless tobacco: Not on file  . Alcohol Use: Yes     Comment: 2-3 shots of scotch per day    Review of Systems  Constitutional: Positive for fever, chills, diaphoresis and fatigue.  HENT: Negative for neck pain and neck stiffness.   Respiratory: Positive for cough and shortness of breath. Negative for wheezing.   Cardiovascular: Negative for chest pain, palpitations and leg swelling.  Gastrointestinal: Positive for abdominal pain. Negative for nausea, vomiting, diarrhea and constipation.  Genitourinary: Negative for dysuria and frequency.  Musculoskeletal: Negative for myalgias and back pain.  Skin: Negative for rash and wound.  Neurological: Positive for dizziness, weakness (generalized) and light-headedness. Negative for numbness and headaches.    Allergies  Review of patient's allergies indicates no known allergies.  Home Medications   No current outpatient prescriptions on file. BP 128/64  Pulse 91  Temp(Src) 98.6 F (37 C) (Oral)  Resp 18  Ht 5\' 11"  (1.803 m)  Wt 130 lb 1.1 oz (59 kg)  BMI 18.15 kg/m2  SpO2 100% Physical Exam  ED Course   Procedures (including critical care time)  Labs Reviewed  CBC WITH DIFFERENTIAL - Abnormal; Notable for the following:    RBC 2.60 (*)    Hemoglobin 9.5 (*)    HCT 26.0 (*)    MCH 36.5 (*)    MCHC 36.5 (*)    All other components within normal limits  COMPREHENSIVE METABOLIC PANEL - Abnormal; Notable for the following:    Sodium 134 (*)  Chloride 95 (*)    Glucose, Bld 107 (*)    Creatinine, Ser 1.51 (*)    Calcium 8.3 (*)    Albumin 2.3 (*)    GFR calc non Af Amer 39 (*)    GFR calc Af Amer 45 (*)    All other components within normal limits  PRO B NATRIURETIC PEPTIDE - Abnormal; Notable for the following:    Pro B Natriuretic peptide (BNP) 3985.0 (*)    All other components within normal limits  URINALYSIS, ROUTINE W REFLEX MICROSCOPIC - Abnormal; Notable for the following:    Color, Urine AMBER (*)     APPearance CLOUDY (*)    Hgb urine dipstick SMALL (*)    Protein, ur 30 (*)    All other components within normal limits  POTASSIUM - Abnormal; Notable for the following:    Potassium 3.4 (*)    All other components within normal limits  URINE MICROSCOPIC-ADD ON - Abnormal; Notable for the following:    Casts HYALINE CASTS (*)    All other components within normal limits  COMPREHENSIVE METABOLIC PANEL - Abnormal; Notable for the following:    Sodium 134 (*)    Potassium 3.3 (*)    Glucose, Bld 100 (*)    Creatinine, Ser 1.48 (*)    Calcium 8.0 (*)    Albumin 2.0 (*)    GFR calc non Af Amer 40 (*)    GFR calc Af Amer 46 (*)    All other components within normal limits  CBC WITH DIFFERENTIAL - Abnormal; Notable for the following:    RBC 2.58 (*)    Hemoglobin 9.2 (*)    HCT 26.0 (*)    MCV 100.8 (*)    MCH 35.7 (*)    All other components within normal limits  MRSA PCR SCREENING  CULTURE, BLOOD (ROUTINE X 2)  CULTURE, BLOOD (ROUTINE X 2)  TROPONIN I  CK  LACTIC ACID, PLASMA   Dg Chest Port 1 View  07/12/2013   *RADIOLOGY REPORT*  Clinical Data: Weakness  PORTABLE CHEST - 1 VIEW  Comparison: 07/11/2013  Findings: Cardiomediastinal silhouette is stable.  No pulmonary edema.  Atherosclerotic calcifications of thoracic aorta again noted.  Persistent bilateral small pleural effusion with streaky bilateral basilar atelectasis or infiltrate right greater than left.  IMPRESSION: No pulmonary edema.  Atherosclerotic calcifications of thoracic aorta again noted.  Persistent bilateral small pleural effusion with streaky bilateral basilar atelectasis or infiltrate right greater than left.   Original Report Authenticated By: Natasha Mead, M.D.   1. Healthcare-associated pneumonia   2. Hypotension   3. Weakness     Date: 07/12/2013  Rate: 109  Rhythm: sinus tachycardia  QRS Axis: normal  Intervals: QRS prolonged  ST/T Wave abnormalities: nonspecific T wave changes  Conduction  Disutrbances:left bundle branch block  Narrative Interpretation:   Old EKG Reviewed: changes noted Pt has what appears to be PAC's since previous EKG.   MDM  Discussed with [facility] who will admit for unresolved Pneumonia. Repeat labs pending   Loren Racer, MD 07/13/13 1705

## 2013-07-12 NOTE — Progress Notes (Signed)
Occupational Therapy Treatment Patient Details Name: Wesley [name] MRN: [id number] DOB: July 20, 1920 Today's Date: 07/12/2013 Time: 2440-1027 OT Time Calculation (min): 29 min  OT Assessment / Plan / Recommendation  History of present illness Pt is a 79 y/o male admitted after a fall with inability to get up. Per chart review, pt was on the floor for a day and a half before his aide found him and called 911. Pt subsequently with rhabdomyolysis as a result of dehydration.    OT comments  Pt continues to be dependent in bed mobility, a high fall risk, and to require assist for ADL.  He is adamant he wants to go home and claims his boarders and cousin with help him to the degree that is necessary.   Follow Up Recommendations  SNF;Supervision/Assistance - 24 hour (Pt is refusing SNF, plan for HHOT.)    Barriers to Discharge       Equipment Recommendations  None recommended by OT    Recommendations for Other Services    Frequency Min 2X/week   Progress towards OT Goals Progress towards OT goals: Not progressing toward goals - comment (self limiting)  Plan Discharge plan needs to be updated    Precautions / Restrictions Precautions Precautions: Fall   Pertinent Vitals/Pain VSS, on 02, soreness in hips/ legs with movement, repositioned    ADL  Eating/Feeding: Modified independent Where Assessed - Eating/Feeding: Bed level Grooming: Wash/dry hands;Wash/dry face;Set up Where Assessed - Grooming: Unsupported sitting Upper Body Dressing: Set up Where Assessed - Upper Body Dressing: Unsupported sitting Equipment Used: Gait belt Transfers/Ambulation Related to ADLs: Min guard A for all with rollator ADL Comments: Pt eating in an unsafe position in bed upon OTs arrival.  Agreeable to sitting up in chair to eat, but once at EOB, pt declined stating he was too cold.  Stood and took 2 steps to [facility] with rollator.  Declined ADL other than washing face and hands, had been bathed earlier.     OT  Diagnosis:    OT Problem List:   OT Treatment Interventions:     OT Goals(current goals can now be found in the care plan section) Acute Rehab OT Goals Patient Stated Goal: to return home  Visit Information  Last OT Received On: 07/12/13 Assistance Needed: +1 History of Present Illness: Pt is a 79 y/o male admitted after a fall with inability to get up. Per chart review, pt was on the floor for a day and a half before his aide found him and called 911. Pt subsequently with rhabdomyolysis as a result of dehydration.     Subjective Data      Prior Functioning       Cognition  Cognition Arousal/Alertness: Awake/alert Behavior During Therapy: WFL for tasks assessed/performed Overall Cognitive Status: Impaired/Different from baseline Area of Impairment: Safety/judgement;Awareness Safety/Judgement: Decreased awareness of safety;Decreased awareness of deficits General Comments: Pt continuing to decline SNF even with PT pointing out his inability to get his legs in the bed, fall risk, low endurance, and pt going home with foley.    Mobility  Bed Mobility Bed Mobility: Supine to Sit;Sitting - Scoot to Edge of Bed;Sit to Supine Supine to Sit: 4: Min assist;With rails;HOB elevated Sitting - Scoot to Edge of Bed: 4: Min assist;With rail Sit to Supine: 4: Min assist (for LEs) Transfers Transfers: Sit to Stand;Stand to Sit Sit to Stand: 4: Min guard;With upper extremity assist;With armrests;From bed Stand to Sit: 4: Min guard;With upper extremity assist;With armrests;To bed  Exercises      Balance     End of Session OT - End of Session Equipment Utilized During Treatment: Gait belt;Oxygen Activity Tolerance: Patient tolerated treatment well Patient left: in bed;with call bell/phone within reach;with family/visitor present;with nursing/sitter in room  GO     Evern Bio 07/12/2013, 9:14 AM [phone]

## 2013-07-12 NOTE — H&P (Signed)
[facility] History and Physical  Wesley [name] WUJ:[id number] DOB: [birth year] DOA: 07/12/2013  Referring physician: ER physician. PCP: Wesley Regulus, MD   Chief Complaint: Weakness.  HPI: Wesley [name] is a 79 y.o. male who was discharged yesterday after being admitted for fall with rhabdomyolysis and acute renal failure secondary to bladder outlet obstruction presents to the ER because of weakness and unable to ambulate. Patient at the time of discharge was advised to skilled nursing facility which patient had refused. In the ER patient was found to be hypotensive with tachycardia and a fluid bolus was given with the improvement in blood pressure. Patient's chest x-ray shows possible infiltrates and during recent admission patient also was treated for pneumonia. Patient presently will be admitted for further management. Patient has been empirically started on antibiotics for pneumonia and will be given fluid boluses for hypotension given the history of CHF. Patient denies any chest pain shortness of breath nausea vomiting abdominal pain diarrhea focal deficits. Patient's labs are pending.  Review of Systems: As presented in the history of presenting illness, rest negative.  Past Medical History  Diagnosis Date  . Osteoarthrosis, unspecified whether generalized or localized, other specified sites   . Acute kidney failure with other specified pathological lesion in kidney   . Prostatitis   . Postsurgical percutaneous transluminal coronary angioplasty status     weintraub  . Cataract extraction status `  . Peripheral vascular disease, unspecified   . Personal history of malignant neoplasm of larynx   . Elevated prostate specific antigen (PSA)   . Gout, unspecified   . Acute peptic ulcer with hemorrhage   . Unspecified essential hypertension   . Hyperlipidemia    Past Surgical History  Procedure Laterality Date  . Percutaneous translumional coronary angioplastty      stents to  bilateral legs for PVD  . Cataract extraction    . Truncal vagotomy  06/2005  . Gastrojejunosotomy  06/2005  . Cholecystectomy  06/2005   Social History:  reports that he has been smoking Cigarettes and Cigars.  He has been smoking about 0.50 packs per day. He does not have any smokeless tobacco history on file. He reports that  drinks alcohol. He reports that he does not use illicit drugs. Home. where does patient live-- Not sure. Can patient participate in ADLs?  No Known Allergies  Family History  Problem Relation Age of Onset  . Heart disease Father   . Prostate cancer Brother   . Heart disease Brother       Prior to Admission medications   Medication Sig Start Date End Date Taking? Authorizing Provider  amLODipine (NORVASC) 5 MG tablet Take 5 mg by mouth daily.   Yes Historical Provider, MD  aspirin EC 81 MG tablet Take 81 mg by mouth daily.   Yes Historical Provider, MD  feeding supplement (ENSURE COMPLETE) LIQD Take 237 mL by mouth 2 (two) times daily between meals. 07/12/13  Yes Jacques Navy, MD  food thickener (THICK IT) POWD Use to make all liquids nectar thick consistancy 07/12/13  Yes Jacques Navy, MD  metoprolol (LOPRESSOR) 50 MG tablet Take 50 mg by mouth daily.   Yes Historical Provider, MD  potassium chloride SA (K-DUR,KLOR-CON) 10 MEQ tablet Take 1 tablet (10 mEq total) by mouth daily. 07/12/13  Yes Jacques Navy, MD  PRESCRIPTION MEDICATION Apply 1 application topically daily. Medicated cream for sores on legs   Yes Historical Provider, MD   Physical Exam: Filed Vitals:  07/12/13 1843 07/12/13 1930 07/12/13 1945 07/12/13 2015  BP: 92/52 122/41  99/47  Pulse: 112 109 104 109  Temp: 98 F (36.7 C)     TempSrc: Oral     Resp: 23 19 21 19   SpO2: 95% 94% 98% 96%     General:  Well-developed and moderately nourished.  Eyes: Anicteric no pallor.  ENT: No discharge from the ears eyes nose mouth.  Neck: No mass felt.  Cardiovascular: S1-S2  heard.  Respiratory: No rhonchi or crepitations.  Abdomen: Soft nontender bowel sounds present.  Skin: No rash.  Musculoskeletal: No edema.  Psychiatric: Appears normal.  Neurologic: Alert awake oriented to time place and person. Moves all extremities.  Labs on Admission:  Basic Metabolic Panel:  Recent Labs Lab 07/08/13 0715 07/09/13 0548 07/10/13 0540 07/11/13 0500 07/12/13 0636  NA 137 136 136 136 136  K 5.1 4.6 4.1 3.4* 3.3*  CL 100 100 99 99 97  CO2 25 24 25 24 30   GLUCOSE 113* 104* 88 86 104*  BUN 14 22 25* 21 16  CREATININE 1.90* 2.75* 2.39* 1.85* 1.47*  CALCIUM 9.4 8.8 8.7 8.5 8.6  MG  --   --  1.6 1.5  --   PHOS  --   --  2.9 2.7  --    Liver Function Tests:  Recent Labs Lab 07/10/13 0540 07/11/13 0500  ALBUMIN 2.2* 2.0*   No results found for this basename: LIPASE, AMYLASE,  in the last 168 hours No results found for this basename: AMMONIA,  in the last 168 hours CBC:  Recent Labs Lab 07/07/13 0620 07/10/13 0540  WBC 9.0 5.1  NEUTROABS 8.2*  --   HGB 11.9* 10.1*  HCT 33.8* 28.3*  MCV 102.7* 102.9*  PLT 150 148*   Cardiac Enzymes:  Recent Labs Lab 07/07/13 0620 07/08/13 0715 07/09/13 0548 07/12/13 0636  CKTOTAL 1745* 753* 320* 98    BNP (last 3 results)  Recent Labs  07/10/13 0540 07/12/13 0636  PROBNP 3789.0* 2941.0*   CBG: No results found for this basename: GLUCAP,  in the last 168 hours  Radiological Exams on Admission: Dg Chest 2 View  07/11/2013   *RADIOLOGY REPORT*  Clinical Data: Follow up pulmonary edema.  Short of breath.  CHEST - 2 VIEW  Comparison: 07/09/2013  Findings: No significant change bibasilar airspace disease and bilateral effusion.  There is underlying COPD with hyperinflation and pulmonary scarring.  IMPRESSION: No significant change bibasilar airspace disease in bilateral pleural effusion.  This may be due to fluid overload.  Pneumonia could also be present.   Original Report Authenticated By: Janeece Riggers, M.D.   Dg Chest Port 1 View  07/12/2013   *RADIOLOGY REPORT*  Clinical Data: Weakness  PORTABLE CHEST - 1 VIEW  Comparison: 07/11/2013  Findings: Cardiomediastinal silhouette is stable.  No pulmonary edema.  Atherosclerotic calcifications of thoracic aorta again noted.  Persistent bilateral small pleural effusion with streaky bilateral basilar atelectasis or infiltrate right greater than left.  IMPRESSION: No pulmonary edema.  Atherosclerotic calcifications of thoracic aorta again noted.  Persistent bilateral small pleural effusion with streaky bilateral basilar atelectasis or infiltrate right greater than left.   Original Report Authenticated By: Natasha Mead, M.D.   Dg Swallowing Func-speech Pathology  07/11/2013   Lenor Derrick, CCC-SLP     07/11/2013 12:56 PM Objective Swallowing Evaluation: Modified Barium Swallowing Study   Patient Details  Name: Wesley [name] MRN: [id number] Date of Birth: 1920-02-23  Today's Date: 07/11/2013 Time:  1000-1040 SLP Time Calculation (min): 40 min  Past Medical History:  Past Medical History  Diagnosis Date  . Osteoarthrosis, unspecified whether generalized or localized,  other specified sites   . Acute kidney failure with other specified pathological lesion  in kidney   . Prostatitis   . Postsurgical percutaneous transluminal coronary angioplasty  status     weintraub  . Cataract extraction status `  . Peripheral vascular disease, unspecified   . Personal history of malignant neoplasm of larynx   . Elevated prostate specific antigen (PSA)   . Gout, unspecified   . Acute peptic ulcer with hemorrhage   . Unspecified essential hypertension   . Hyperlipidemia    Past Surgical History:  Past Surgical History  Procedure Laterality Date  . Percutaneous translumional coronary angioplastty      stents to bilateral legs for PVD  . Cataract extraction    . Truncal vagotomy  06/2005  . Gastrojejunosotomy  06/2005  . Cholecystectomy  06/2005   HPI:  79 year old male admitted 07/04/13 s/p  fall.  PMH significant for  COPD, throat CA s/p radiation treatment.  RN contacted SLP re:  choking episodes, sometimes on secretions.     Assessment / Plan / Recommendation Clinical Impression  Dysphagia Diagnosis: Moderate cervical esophageal phase  dysphagia;Severe cervical esophageal phase dysphagia;Moderate  pharyngeal phase dysphagia Clinical impression: Pt. exhibits a moderate-severe cervical  esophageal dysphagia, with a very tight crichopharyngeal segment,  that caused a 13mm barium tablet to lodge. Great effort and  multiple swallows of liquids, puree, and pudding consistencies,  to finally move the pill. While the pill was lodged, swallows of  liquids and even pudding were aspirated.  Prior to the pill  episode, pt. aspirated thin liquids during the swallow due to  reduced laryngeal elevation and decreased laryngeal closure.  Pt.  did have a cough response to aspiration, but this did not clear  the aspirate.  Pt. also coughed at other times, when there was no  aspiration or penetration observed.  Head turn to right and chin  tuck were attempted, but did not prevent aspiration.  There was  no radiologist present to confirm the esophageal observations.   Question if pt. would be a candiate for dilitation of UES or  possible botox injection.      Treatment Recommendation  Therapy as outlined in treatment plan below    Diet Recommendation Dysphagia 1 (Puree);Nectar-thick liquid   Liquid Administration via: No straw Medication Administration: Crushed with puree Supervision: Patient able to self feed;Full supervision/cueing  for compensatory strategies Compensations: Slow rate;Small sips/bites Postural Changes and/or Swallow Maneuvers: Seated upright 90  degrees;Upright 30-60 min after meal    Other  Recommendations Recommended Consults: Consider ENT  evaluation Oral Care Recommendations: Oral care QID Other Recommendations: Have oral suction available;Clarify  dietary restrictions   Follow Up Recommendations   24 hour supervision/assistance    Frequency and Duration min 2x/week  2 weeks   Pertinent Vitals/Pain [name]/a    SLP Swallow Goals Patient will consume recommended diet without observed clinical  signs of aspiration with: Moderate assistance Patient will utilize recommended strategies during swallow to  increase swallowing safety with: Moderate assistance   General HPI: 79 year old male admitted 07/04/13 s/p fall.  PMH  significant for COPD, throat CA s/p radiation treatment.  RN  contacted SLP re: choking episodes, sometimes on secretions. Type of Study: Modified Barium Swallowing Study Reason for Referral: Objectively evaluate swallowing function Previous Swallow Assessment: BSE 8/17  Diet Prior to this Study: Thin liquids (Full Liquids) Temperature Spikes Noted: No Respiratory Status: Supplemental O2 delivered via (comment) History of Recent Intubation: No Behavior/Cognition: Alert;Cooperative;Pleasant mood Oral Cavity - Dentition: Dentures, top Oral Motor / Sensory Function: Within functional limits Self-Feeding Abilities: Able to feed self Patient Positioning: Upright in chair Baseline Vocal Quality: Clear Volitional Cough: Weak Volitional Swallow: Unable to elicit Anatomy: Other (Comment) (s/p radiation treatment for cancer) Pharyngeal Secretions: Not observed secondary MBS    Reason for Referral Objectively evaluate swallowing function   Oral Phase Oral Preparation/Oral Phase Oral Phase: Impaired Oral - Solids Oral - Regular: Impaired mastication;Delayed oral transit (No  lower teeth)   Pharyngeal Phase Pharyngeal Phase Pharyngeal Phase: Impaired Pharyngeal - Thin Pharyngeal - Thin Teaspoon: Delayed swallow initiation;Premature  spillage to pyriform sinuses;Reduced epiglottic inversion;Reduced  anterior laryngeal mobility;Reduced laryngeal elevation;Reduced  airway/laryngeal closure;Penetration/Aspiration during swallow Penetration/Aspiration details (thin teaspoon): Material enters  airway, remains ABOVE vocal  cords and not ejected out Pharyngeal - Thin Cup: Delayed swallow initiation;Reduced  epiglottic inversion;Reduced anterior laryngeal mobility;Reduced  laryngeal elevation;Reduced airway/laryngeal  closure;Penetration/Aspiration during swallow;Pharyngeal residue  - cp segment Penetration/Aspiration details (thin cup): Material enters  airway, passes BELOW cords and not ejected out despite cough  attempt by patient Pharyngeal - Solids Pharyngeal - Pill: Reduced anterior laryngeal mobility;Reduced  laryngeal elevation;Reduced tongue base retraction;Pharyngeal  residue - cp segment  Cervical Esophageal Phase    GO    Cervical Esophageal Phase Cervical Esophageal Phase: Impaired Cervical Esophageal Phase - Solids Pill: Reduced cricopharyngeal relaxation;Prominent  cricopharyngeal segment Cervical Esophageal Phase - Comment Cervical Esophageal Comment: The 13mm tablet lodged at the level  of the CP segment and finally cleared after multiple swallows  with liquids, puree, and then pudding consistencies and effortful  swallows (likely after it begain to dissolve).           Wesley [name] 07/11/2013, 12:54 PM      Assessment/Plan Principal Problem:   Weakness Active Problems:   Hypotension   Healthcare-associated pneumonia   Renal failure   CHF (congestive heart failure)   1. Weakness with hypotension - weakness is probably secondary to patient's hypotension. Hypotension could be from dehydration. Patient's labs are pending including metabolic panel lactate acid levels hemoglobin and CK levels. Continue with as needed fluid boluses. Patient is mildly hypotensive with tachycardia but I don'[name] think patient's septic. Patient has been started on empiric antibiotics for health care associated pneumonia. Hold antihypertensives. 2. Possible pneumonia - patient has been placed on vancomycin and cefepime for possible health care associated pneumonia. Blood cultures have been ordered. 3. CHF - recent 2-D echo showed EF  of 60-65%. Since patient is mildly hypotensive patient will be given when necessary IV fluid boluses as needed. 4. Renal failure with bladder outlet obstruction on full catheter - patient's creatinine improved with Foley catheter placement and hydration during recent admission. Metabolic panel is pending. Continue to follow intake output and metabolic panel. 5. History of throat cancer in remission.  Patient's labs are pending.    Code Status: DO NOT RESUSCITATE.  Family Communication: None.  Disposition Plan: Admit to inpatient under Dr. Oliver Barre.    Wesley [name]. [facility] Pager [phone].  If 7PM-7AM, please contact night-coverage [email or web address] Password [facility] 07/12/2013, 8:44 PM

## 2013-07-12 NOTE — Progress Notes (Signed)
For d/c home although advised to accept SNF  Dictated [phone]

## 2013-07-12 NOTE — Discharge Summary (Signed)
NAMEBEVAN, DISNEY NO.:  [id number]  MEDICAL RECORD NO.:  [id number]  LOCATION:  5W12C                        FACILITY:  [facility]  PHYSICIAN:  Rosalyn Gess. Kylin Genna, MD  DATE OF BIRTH:  [birth year]  DATE OF ADMISSION:  07/04/2013 DATE OF DISCHARGE:  07/12/2013                              DISCHARGE SUMMARY   ADMITTING DIAGNOSIS:  Rhabdomyolysis.  DISCHARGE DIAGNOSES: 1. Rhabdomyolysis, resolved. 2. Acute renal insufficiency, improving. 3. Bladder outlet obstruction secondary to benign prostatic     hypertrophy versus prostate cancer. 4. Congestive heart failure.  CONSULTANTS:  Terrial Rhodes, M.D. for Nephrology.  PROCEDURES/IMAGING: 1. CT cervical spine on day of admission with no cervical spine     fractures, multi-level degenerative disk disease with moderate     spinal stenosis, C3-4 and C6-7.  Multilevel foraminal stenosis.     Sclerosis involving C3, C7, T3 in the right pedicle and transverse     process at T3 suggestive of metastatic prostate disease.  CT of the     head on day of admission, no acute intracranial abnormality noted,     stable severe generalized atrophy, and moderate-to-severe chronic     microvascular ischemic changes are noted.  Left parietal scalp     hematoma of the vertex without underlying skull fracture noted. 2. Chest x-ray on day of admission, right lower lobe pneumonia     superimposed on COPD emphysema.  Stable scarring left lower lobe. 3. Chest x-ray, July 05, 2013, persistent opacity in the right lung     base.  Emphysema with left basilar scarring. 4. Ultrasound renal July 09, 2013, with bilateral hydronephrosis.     No diagnostic renal calculus.  Mild proximal right hydroureter.     Bilateral echogenic kidney probably due to atrophy or chronic     medical renal disease.  Distended urinary bladder with 846 mL     retention. 5. Portable chest x-ray July 09, 2013, with congestive heart failure     superimposed upon  underlying emphysema. 6. CT abdomen and pelvis July 10, 2013, with at least 2 small stones     in the distal left ureter, which appear to be causing mild     hydronephrosis, sigmoid colon diverticulosis.  Prostate measures 52     x 50 x 45 mm representing mild enlargement when compared to the     prior study.  Right lower lobe consolidation.  Stable aortic     aneurysm noted. 7. Chest x-ray, July 11, 2013, with no significant change in     bibasilar airspace disease and bilateral pleural effusion. 8. Modified barium swallow, which revealed moderate cervical     esophageal phase dysphagia.  Moderate-to-severe cervical dysphagia     with cricopharyngeal segment that cause a 13 mm barium tablet to     lodge.  Great effort and multiple swallows of liquids.  Pudding     consistencies finally moved the pill.  Recommendation was for     pureed diet with nectar thick liquid.  HISTORY OF PRESENT ILLNESS:  Mr. Lyford is a 79 year old gentleman with history of peripheral vascular disease, history of elevated PSA/prostatitis, history of gout, hypertension, hyperlipidemia,  history of throat cancer status post treatment who is in remission.  The patient was at home, he fell, was unable to get up.  He was evidently on the floor for approximately 36 hours.  He was found on the morning of admission and brought to the emergency department.  The patient did not have access to food or water during the time that he was down and was incontinent of bowel and bladder.  The patient in the emergency department was found have a creatinine of 1.03.  CK was 7169, troponin was less than 0.3, lactic acid was 7.1, platelet count was low at 128,000.  Urinalysis was unremarkable.  CT as noted in the above report. Question of consolidation in the lung suggestive of pneumonia.  The patient was subsequently admitted for management of these problems.  HOSPITAL COURSE: 1. Rhabdomyolysis.  The patient was vigorously  hydrated during the     initial several days of his hospitalizations with a steady decline     in his total CK.  Final measurement day of discharge was 80.     This was considered to be a resolved problem. 2. Imbalance and falls.  The patient was seen by Physical Therapy who     thought the patient would benefit from ongoing physical therapy and     recommended skilled care.  The patient was adamant about not going     to skilled care, and therefore he will require home PT, which has     been ordered. 3. Community-acquired pneumonia.  The patient had radiographic     findings consistent with consolidation, but he had no leukocytosis.     No cough, no fever.  The patient was initially started on     doxycycline.  He was switched to Rocephin and azithromycin and     received 6 days of Rocephin and 5 days of azithromycin.  He never     produced any sputum.  He never had an active cough, never had     fever.  Chest x-ray remained unchanged.  In retrospect, it feels     unlikely the patient had an active pneumonia. 4. Renal.  The patient developed acute renal insufficiency secondary     to bladder outlet obstruction, which was either due to small     ureteral stone with hydronephrosis versus enlargement of the     prostate gland.  The patient has a history of an elevated PSA, but     has never warranted treatment.  His PSA was recheck and was 454.     CT did reveal an enlarged prostate.  The patient did have Foley     catheter placed and with that he did have good diuresis with post     obstructive diuresis of over 3 L.  The patient was to continue with     Foley catheter given the enlargement of prostate and bladder outlet     obstruction.  The patient continued to improve and his final     creatinine on the day of discharge was 1.47.  Nephrology felt no     additional workup was needed and signed off.  They did recommend     the patient have an outpatient Urology followup.  The patient  will     be discharged home with Foley catheter with home health nursing to     help manage and he will be set up with Urology as soon as possible     basis.  5. Congestive heart failure.  With his acute renal insufficiency, the     patient also developed acute congestive heart failure.  A 2D echo     was performed on July 10, 2013, which showed an ejection fraction     of 60% to 65%, which was probably pseudo normal ejection fraction     with abnormal left ventricular relaxation as grade 1 diastolic     dysfunction.  It was felt that his heart failure was diastolic in     nature.  The patient was diuresed and had improvement.  ProBNP on     July 10, 2013, was 3789 and on July 12, 2013, was down to 2941.     The patient continues to have good urine output.  He will be     continued on medications to help manage his heart failure including     diuretics.  The patient will need to have followup with Dr.     Alanda Amass at [facility] and [facility] to be arranged on as     soon as possible basis after discharge.   Disposition.  During his hospital stay, the patient was advised by     Physical Therapy, Occupational Therapy, nursing case management,     social worker, and Medical to have skilled care.  The patient's     family also pressured him to accept skilled care.  The patient was     adamant in his refusal to accept skilled care and insisted on     returning to home.  The patient is mentally competent and therefore     able to make decisions for himself.  The patient will be discharged     to home.  He has arranged to have people with him in the home most     of the day.  He will have home health nursing PT, OT, and Aide     Services.  The patient has been advised that this is considered a     less than optimal move.  He is advised that he may have significant     meaningful recurrent problems.  Discussed with the patient end of life care.  Reviewed the probabilities of  outcome with CPR in the event of a cardiac arrest as well as the  sequelae of success involving Intensive Care for prolonged period of time.  At this point, the patient does agree to being a DNR.  He will be sent home with an out of facility order.  DISCHARGE EXAMINATION:  VITAL SIGNS:  Temperature was 99.8, blood pressure 107/63, heart rate 105, respirations 18, O2 sats 95%. GENERAL APPEARANCE:  This is a very thin elderly African American gentleman who is awake, alert, in no distress. HEENT:  Conjunctivae and sclerae were slightly muddied.  He has arcus senilis.  Pupils were reactive.  The patient has full dentures, which are poorly fitted and loose. NECK:  Supple.  No JVD was apparent in the sitting position. PULMONARY:  The patient has no increased work of breathing.  He has no wheezing. CARDIOVASCULAR:  The patient had a quiet precordium with a regular rate and rhythm. ABDOMEN:  Scaphoid.  He had positive bowel sounds.  No guarding or rebound. GENITALIA:  The patient has an indwelling Foley catheter. EXTREMITIES:  The patient is very thin, but no actual deformities are noted. DERM:  The patient has an abrasion on his distal left lower extremity slow to heal, will require wound dressings with just  dry dressing. NEURO:  The patient is awake, alert.  He is oriented to person, place, time, and context.  His cognition seems normal.  No slurred speech, no facial droop, or other focal abnormalities noted.  FINAL LABORATORY DATA:  From the day of discharge, sodium was 136, potassium 3.3, chloride 97, CO2 of 30, BUN 16, creatinine 1.47, glucose was 104.  Total CK was 98.  BNP was 2941.  Final CBC from July 10, 2013, was with a white count of 5100, hemoglobin 10.1 g, platelet count 148,000.  PSA performed July 05, 2013, was 434.  Urinalysis on July 09, 2013, was negative.  DISCHARGE MEDICATIONS: 1. Amlodipine 5 mg daily. 2. Aspirin 81 mg daily. 3. Ensure 1 can b.i.d. between  meals with Thick-It powder to be used     to make liquids to nectar thick consistency. 4. Metoprolol 50 mg once daily. 5. Potassium 10 mEq daily. 6. Medicated cream for use on his legs, which he has at home, which he     will resume using.  DISPOSITION:  The patient is discharged home.  Appointment will be scheduled for him with Urology on an ASAP basis.  He will see Dr. Debby Bud on the same day he sees Urology for convenience in transportation.  The patient will be set up to see Dr. Alanda Amass after he sees Urology.  The patient's prognosis is poor given his multiple medical problems and advanced age.  He is a DNR.     Rosalyn Gess Tyner Codner, MD     MEN/MEDQ  D:  07/12/2013  T:  07/12/2013  Job:  [id number]  cc:   Gerlene Burdock A. Alanda Amass, M.D. Terrial Rhodes, M.D.

## 2013-07-12 NOTE — ED Notes (Signed)
Pt to ED via EMS with c/o generalize body pain, mainly at abdomen and dizziness. Pt seen at ED today and left AMA. Pt states "I feel bad." Per EMS CBG-98, RR-18, BP-105/60, SpO2-100% on 2L [place].

## 2013-07-12 NOTE — ED Provider Notes (Signed)
Discussed case with Dr. Berlinda Last, transfer of care from Dr. Berlinda Last at change in shift.   Wesley [name] is a 79 y/o M with PMHx of acute kdiney disease, PVD, h/o malignant neoplasm of the larynx, gout, HTN, and HLD presenting to the ED with subjective fever, generalized bodyaches, increased fatigue, weakness, abdominal pain. Patient was discharged today at approximately 12:00PM for rhabdomyolosis - patient was treated in the hospital for pneumonia, but discharged since asymptomatic and negative elevation in WBC. In ED setting patient has had a mild low-grade fever, the highest temperature being 99.99F. Patient has been mildly tachycardic throughout the stay secondary to rise in temperature. Patient has had some episodes of hypotension while in ED setting - lowest being 92/52.   Dr. Berlinda Last has placed in all the orders, just awaiting labs. Dr. Berlinda Last has placed patient on IV fluids and IV broad-spectrum antibiotics. Dr. Berlinda Last has spoken to Dr. Toniann Fail and patient is admitted to Internal medicine. Dr. Rueben Bash to see patient.  CK 111. First set of troponins negative elevation. Pro BNP 3985. CMP low sodium (134) and chloride, patient appears dehydrated. Elevated Creatinine (1.51). CBC negative elevation of WBC. Lactic acid negative elevation. Negative infection noted to urine. Chest xray noted persistence bilateral small pleural effusions with streaky bilateral basilar atelectasis/infiltrate in the right lobe more so than the left - suspicion to HCAP.  Blood cultures obtained.   8:23PM Dr. Toniann Fail to see patient. Patient admitted to Internal Medicine services.    Raymon Mutton, PA-C 07/13/13 [phone]

## 2013-07-13 ENCOUNTER — Telehealth: Payer: Self-pay | Admitting: Internal Medicine

## 2013-07-13 DIAGNOSIS — R972 Elevated prostate specific antigen [PSA]: Secondary | ICD-10-CM

## 2013-07-13 DIAGNOSIS — I509 Heart failure, unspecified: Secondary | ICD-10-CM

## 2013-07-13 DIAGNOSIS — I5033 Acute on chronic diastolic (congestive) heart failure: Secondary | ICD-10-CM

## 2013-07-13 LAB — CBC WITH DIFFERENTIAL/PLATELET
Basophils Relative: 0 % (ref 0–1)
Eosinophils Absolute: 0.1 10*3/uL (ref 0.0–0.7)
Eosinophils Relative: 1 % (ref 0–5)
Hemoglobin: 9.2 g/dL — ABNORMAL LOW (ref 13.0–17.0)
Lymphocytes Relative: 17 % (ref 12–46)
MCHC: 35.4 g/dL (ref 30.0–36.0)
Neutrophils Relative %: 75 % (ref 43–77)
RBC: 2.58 MIL/uL — ABNORMAL LOW (ref 4.22–5.81)

## 2013-07-13 LAB — COMPREHENSIVE METABOLIC PANEL
CO2: 27 mEq/L (ref 19–32)
Calcium: 8 mg/dL — ABNORMAL LOW (ref 8.4–10.5)
Creatinine, Ser: 1.48 mg/dL — ABNORMAL HIGH (ref 0.50–1.35)
GFR calc Af Amer: 46 mL/min — ABNORMAL LOW (ref >90)
GFR calc non Af Amer: 40 mL/min — ABNORMAL LOW (ref >90)
Glucose, Bld: 100 mg/dL — ABNORMAL HIGH (ref 70–99)

## 2013-07-13 LAB — MRSA PCR SCREENING: MRSA by PCR: NEGATIVE

## 2013-07-13 MED ORDER — FUROSEMIDE 40 MG PO TABS
40.0000 mg | ORAL_TABLET | Freq: Two times a day (BID) | ORAL | Status: DC
  Administered 2013-07-13 – 2013-07-17 (×8): 40 mg via ORAL
  Filled 2013-07-13 (×10): qty 1

## 2013-07-13 NOTE — Progress Notes (Signed)
Subjective: Wesley [name] was d/c 8/20 after a course in the hospital for mild rhabdo complicated by BOO with acute renal failure. He had a possible RLL pneumonia and received several days of antibiotics. By Aug 20 there were no clinical signs of pneumonia, his acute renal obstructive renal failure was improving and acute CHF was improved and he was on oral diuretics.  Wesley [name] developed more acute SOB after being home for several hours. He was returned to [facility] ED where CXR revealed decreased pleural effusions, emphysema and atelectasis with interstial edema and possible infiltrate. Lab reveal improved renal function, absence of leukocytosis and a normal differential. He is admitted with weakness, SOB and HAP.  Objective: Lab:  Recent Labs  07/12/13 1853 07/13/13 0350  WBC 6.5 5.7  NEUTROABS 5.0 4.2  HGB 9.5* 9.2*  HCT 26.0* 26.0*  MCV 100.0 100.8*  PLT 276 214    Recent Labs  07/11/13 0500 07/12/13 0636 07/12/13 1853 07/12/13 2144 07/13/13 0350  NA 136 136 134*  --  134*  K 3.4* 3.3* 4.7 3.4* 3.3*  CL 99 97 95*  --  96  GLUCOSE 86 104* 107*  --  100*  BUN 21 16 18   --  18  CREATININE 1.85* 1.47* 1.51*  --  1.48*  CALCIUM 8.5 8.6 8.3*  --  8.0*  MG 1.5  --   --   --   --   PHOS 2.7  --   --   --   --     Imaging: X-rays reveiwed with radiologist and the series supports initial pna with improvement, pleural effusions with improvement and at this admission emphysema and interstial edema.  Scheduled Meds: . aspirin EC  81 mg Oral Daily  . ceFEPime (MAXIPIME) IV  1 g Intravenous Q24H  . feeding supplement  237 mL Oral BID BM  . sodium chloride  3 mL Intravenous Q12H  . sodium chloride  3 mL Intravenous Q12H  . vancomycin  750 mg Intravenous Q24H   Continuous Infusions:  PRN Meds:.acetaminophen, acetaminophen, food thickener, ondansetron (ZOFRAN) IV, ondansetron   Physical Exam: Filed Vitals:   07/13/13 1541  BP: 128/64  Pulse: 91  Temp: 98.6 F (37 C)  Resp: 18     Intake/Output Summary (Last 24 hours) at 07/13/13 1849 Last data filed at 07/13/13 1700  Gross per 24 hour  Intake    530 ml  Output    600 ml  Net    -70 ml   Gen'l- Elderly thin AA man in no distress. O2 sat 100% Chest - good breath sounds w/o rales Cor - RRR      Assessment/Plan: 1. ID/pul - no fever, sputum production, leukocytosis or left shift with no clear cut infiltrate on x-ray= no clear evidence of HAP  Plan D/c antibiotic  F/u CBCD  procalcitonin  2. Renal - patient with obstructive renal failure improving  Plan Leave foley  Follow Bmet  Urology consult - will call in AM  3. Cardiac - patient with probable CHF. Mildly elevated BNP. At admission he was hypotensive with possible intravascular depletion.  2 D echo Aug 18th: Study Conclusions  - Procedure narrative: Transthoracic echocardiography. Image quality was poor. The study was technically difficult, as a result of poor acoustic windows. - Left ventricle: Systolic function was normal. The estimated ejection fraction was in the range of 60% to 65%. Doppler parameters are consistent with abnormal left ventricular relaxation (grade 1 diastolic dysfunction). Doppler parameters are consistent with  high ventricular filling pressure. - Pulmonary arteries: PA peak pressure: 41mm Hg (S).  Suggestive of diastolic dysfunction  Plan Continue oral diuretics  Daily wts and strict I/Os  Will add coreg in AM if BP stable  Consult [facility] in AM  4. Code status - limited code.  Stable to transfer to tele bed.      Illene Regulus [facility] (o) [phone]; (c) [phone] Call-grp - [facility]  Tele: [phone]  07/13/2013, 6:45 PM

## 2013-07-13 NOTE — Progress Notes (Signed)
Utilization Review Completed.  

## 2013-07-13 NOTE — Progress Notes (Signed)
[facility]  Patient Status: New  [facility] is providing the following services: RN, PT, OT and MSW Referred for services but came back to hospital day of discharge before start of services.  If patient discharges after hours, please call [phone].   Kizzie Furnish 07/13/2013, 11:18 AM

## 2013-07-13 NOTE — Progress Notes (Signed)
INITIAL NUTRITION ASSESSMENT  DOCUMENTATION CODES Per approved criteria  -Underweight   INTERVENTION:  Strawberry Ensure Complete twice daily (350 kcals, 13 gm protein per 8 fl oz bottle) RD to follow for nutrition care plan  NUTRITION DIAGNOSIS: Inadequate oral intake related to poor appetite as evidenced by meal tray observation  Goal: Pt to meet >/= 90% of their estimated nutrition needs   Monitor:  PO & supplemental intake, weight, labs, I/O's  Reason for Assessment: Malnutrition Screening Tool Report  79 y.o. male  Admitting Dx: Weakness  ASSESSMENT: Patient who was discharged 8/20 after being admitted for fall with rhabdomyolysis; presented to ER because of weakness and unable to ambulate; found to be hypotensive with tachycardia; chest X-ray showed possible infiltrates ---> admitted for further management.   RD unable to obtain much nutrition hx from patient; very sleepy; was able to tell me he likes Strawberry Ensure supplements; PO intake poor at 25% per flowsheet records; patient with visible muscle wasting to upper body; RD attempted to speak with Reggie (Grandson); no answer to phone call.  RD suspects some level of malnutrition, however, unable to identify at this time.  Height: Ht Readings from Last 1 Encounters:  07/12/13 5\' 11"  (1.803 m)    Weight: Wt Readings from Last 1 Encounters:  07/13/13 130 lb 1.1 oz (59 kg)    Ideal Body Weight: 172 lb  % Ideal Body Weight: 75%  Wt Readings from Last 15 Encounters:  07/13/13 130 lb 1.1 oz (59 kg)  07/12/13 140 lb 10.5 oz (63.8 kg)  11/22/12 141 lb 0.6 oz (63.975 kg)  11/01/12 141 lb 12.8 oz (64.32 kg)  01/25/12 145 lb 2 oz (65.828 kg)  12/02/11 143 lb (64.864 kg)  03/12/11 149 lb (67.586 kg)  12/30/10 148 lb (67.132 kg)  10/21/10 149 lb (67.586 kg)  05/30/09 150 lb (68.04 kg)  07/05/08 161 lb (73.029 kg)  04/20/07 146 lb (66.225 kg)    Usual Body Weight: 141 lb  % Usual Body Weight: 92%  BMI:   Body mass index is 18.15 kg/(m^2).  Estimated Nutritional Needs: Kcal: 1500-1700 Protein: 70-80 gm Fluid: 1.5-1.7 L  Skin: leg wound  Diet Order: Cardiac  EDUCATION NEEDS: -No education needs identified at this time   Intake/Output Summary (Last 24 hours) at 07/13/13 1022 Last data filed at 07/13/13 0800  Gross per 24 hour  Intake    170 ml  Output    400 ml  Net   -230 ml    Labs:   Recent Labs Lab 07/10/13 0540 07/11/13 0500 07/12/13 0636 07/12/13 1853 07/12/13 2144 07/13/13 0350  NA 136 136 136 134*  --  134*  K 4.1 3.4* 3.3* 4.7 3.4* 3.3*  CL 99 99 97 95*  --  96  CO2 25 24 30 28   --  27  BUN 25* 21 16 18   --  18  CREATININE 2.39* 1.85* 1.47* 1.51*  --  1.48*  CALCIUM 8.7 8.5 8.6 8.3*  --  8.0*  MG 1.6 1.5  --   --   --   --   PHOS 2.9 2.7  --   --   --   --   GLUCOSE 88 86 104* 107*  --  100*     Scheduled Meds: . aspirin EC  81 mg Oral Daily  . ceFEPime (MAXIPIME) IV  1 g Intravenous Q24H  . feeding supplement  237 mL Oral BID BM  . sodium chloride  3 mL Intravenous  Q12H  . sodium chloride  3 mL Intravenous Q12H  . vancomycin  750 mg Intravenous Q24H    Continuous Infusions:   Past Medical History  Diagnosis Date  . Osteoarthrosis, unspecified whether generalized or localized, other specified sites   . Acute kidney failure with other specified pathological lesion in kidney   . Prostatitis   . Postsurgical percutaneous transluminal coronary angioplasty status     weintraub  . Cataract extraction status `  . Peripheral vascular disease, unspecified   . Personal history of malignant neoplasm of larynx   . Elevated prostate specific antigen (PSA)   . Gout, unspecified   . Acute peptic ulcer with hemorrhage   . Unspecified essential hypertension   . Hyperlipidemia     Past Surgical History  Procedure Laterality Date  . Percutaneous translumional coronary angioplastty      stents to bilateral legs for PVD  . Cataract extraction    .  Truncal vagotomy  06/2005  . Gastrojejunosotomy  06/2005  . Cholecystectomy  06/2005    Maureen Chatters, RD, LDN Pager #: [phone] After-Hours Pager #: [phone]

## 2013-07-13 NOTE — Telephone Encounter (Signed)
Called patient, pt is back in the hospital.  Told family member to call when patient is discharged for hospital follow up appointment.

## 2013-07-13 NOTE — Telephone Encounter (Signed)
Message copied by Newell Coral on Thu Jul 13, 2013  2:57 PM ------      Message from: Illene Regulus E      Created: Wed Jul 12, 2013  9:15 AM       1. Transition - Acute renal failure, chf      2. Needs referral to urology ASAP - best date is Wed 27th or after. He has Bladder outlet obstruction, elevated PSA  @ 424 and is sent home with a foley catheter.      3. Needs office visit - try to coordinate with the urology appointment            Contact person for appointments is [name] (c) [phone]            Thanks ------

## 2013-07-14 ENCOUNTER — Inpatient Hospital Stay (HOSPITAL_COMMUNITY): Payer: Medicare Other

## 2013-07-14 ENCOUNTER — Telehealth: Payer: Self-pay | Admitting: General Practice

## 2013-07-14 DIAGNOSIS — N189 Chronic kidney disease, unspecified: Secondary | ICD-10-CM

## 2013-07-14 DIAGNOSIS — M6282 Rhabdomyolysis: Principal | ICD-10-CM

## 2013-07-14 DIAGNOSIS — I129 Hypertensive chronic kidney disease with stage 1 through stage 4 chronic kidney disease, or unspecified chronic kidney disease: Secondary | ICD-10-CM

## 2013-07-14 DIAGNOSIS — Q782 Osteopetrosis: Secondary | ICD-10-CM

## 2013-07-14 LAB — CBC WITH DIFFERENTIAL/PLATELET
Basophils Absolute: 0 10*3/uL (ref 0.0–0.1)
Basophils Relative: 0 % (ref 0–1)
Eosinophils Absolute: 0.1 10*3/uL (ref 0.0–0.7)
HCT: 28.5 % — ABNORMAL LOW (ref 39.0–52.0)
Hemoglobin: 10.3 g/dL — ABNORMAL LOW (ref 13.0–17.0)
Lymphs Abs: 1 10*3/uL (ref 0.7–4.0)
MCH: 35.9 pg — ABNORMAL HIGH (ref 26.0–34.0)
MCHC: 36.1 g/dL — ABNORMAL HIGH (ref 30.0–36.0)
Monocytes Absolute: 0.5 10*3/uL (ref 0.1–1.0)
Neutro Abs: 5.4 10*3/uL (ref 1.7–7.7)
RDW: 13.1 % (ref 11.5–15.5)

## 2013-07-14 LAB — BASIC METABOLIC PANEL
BUN: 15 mg/dL (ref 6–23)
Chloride: 94 mEq/L — ABNORMAL LOW (ref 96–112)
Creatinine, Ser: 1.27 mg/dL (ref 0.50–1.35)
GFR calc non Af Amer: 48 mL/min — ABNORMAL LOW (ref >90)
Glucose, Bld: 105 mg/dL — ABNORMAL HIGH (ref 70–99)
Potassium: 3.3 mEq/L — ABNORMAL LOW (ref 3.5–5.1)

## 2013-07-14 NOTE — Consult Note (Signed)
CC: Recent admission for ARF secondary to BOO.   HPI:  Wesley [name] was again readmitted to hospital 07/12/13 for weakness, and change in kidney function. Was discharged 07/10/13 to home from recent fall, dehydration, and elevated creatine.   Per Wesley [name] and family member no previous hx of prostate cancer, hematuria, or dysuria; but does have hx of frequency and nocturia. Was scheduled April 2014 to see Dr. Brunilda Payor for consult in regards to BPH with LUTS and this appointment was cancelled.    Objective: Vital signs in last 24 hours: Temp:  [97.5 F (36.4 C)-98.8 F (37.1 C)] 98.4 F (36.9 C) (08/22 0744) Pulse Rate:  [73-108] 107 (08/22 0744) Resp:  [18-20] 20 (08/22 0744) BP: (103-128)/(49-64) 119/61 mmHg (08/22 0744) SpO2:  [97 %-100 %] 100 % (08/22 0744) Weight:  [58.6 kg (129 lb 3 oz)] 58.6 kg (129 lb 3 oz) (08/22 0744)  Intake/Output from previous day: 08/21 0701 - 08/22 0700 In: 700 [P.O.:700] Out: 1150 [Urine:1150] Intake/Output this shift: Total I/O In: 120 [P.O.:120] Out: -   Physical Exam:  General:alert, cooperative, appears stated age and no distress GI: soft, non tender, normal bowel sounds, no palpable masses, no organomegaly, no inguinal hernia Male genitalia: Penis: uncircumcised Urethral Meatus: normal with foley draining clear straw colored urine.    Lab Results:  Recent Labs  07/12/13 1853 07/13/13 0350 07/14/13 0500  HGB 9.5* 9.2* 10.3*  HCT 26.0* 26.0* 28.5*   BMET  Recent Labs  07/13/13 0350 07/14/13 0500  NA 134* 133*  K 3.3* 3.3*  CL 96 94*  CO2 27 29  GLUCOSE 100* 105*  BUN 18 15  CREATININE 1.48* 1.27  CALCIUM 8.0* 8.2*   No results found for this basename: LABPT, INR,  in the last 72 hours No results found for this basename: LABURIN,  in the last 72 hours Results for orders placed during the hospital encounter of 07/12/13  CULTURE, BLOOD (ROUTINE X 2)     Status: None   Collection Time    07/12/13  8:48 PM      Result Value Range Status   Specimen Description BLOOD HAND LEFT   Final   Special Requests BOTTLES DRAWN AEROBIC ONLY 4CC   Final   Culture  Setup Time     Final   Value: 07/13/2013 01:32     Performed at [facility]   Culture     Final   Value:        BLOOD CULTURE RECEIVED NO GROWTH TO DATE CULTURE WILL BE HELD FOR 5 DAYS BEFORE ISSUING A FINAL NEGATIVE REPORT     Performed at [facility]   Report Status PENDING   Incomplete  CULTURE, BLOOD (ROUTINE X 2)     Status: None   Collection Time    07/12/13  8:56 PM      Result Value Range Status   Specimen Description BLOOD HAND LEFT   Final   Special Requests BOTTLES DRAWN AEROBIC ONLY 4CC   Final   Culture  Setup Time     Final   Value: 07/13/2013 01:32     Performed at [facility]   Culture     Final   Value:        BLOOD CULTURE RECEIVED NO GROWTH TO DATE CULTURE WILL BE HELD FOR 5 DAYS BEFORE ISSUING A FINAL NEGATIVE REPORT     Performed at [facility]   Report Status PENDING   Incomplete  MRSA PCR SCREENING  Status: None   Collection Time    07/13/13  1:33 AM      Result Value Range Status   MRSA by PCR NEGATIVE  NEGATIVE Final   Comment:            The GeneXpert MRSA Assay (FDA     approved for NASAL specimens     only), is one component of a     comprehensive MRSA colonization     surveillance program. It is not     intended to diagnose MRSA     infection nor to guide or     monitor treatment for     MRSA infections.    Studies/Results: Dg Chest Port 1 View  07/12/2013   *RADIOLOGY REPORT*  Clinical Data: Weakness  PORTABLE CHEST - 1 VIEW  Comparison: 07/11/2013  Findings: Cardiomediastinal silhouette is stable.  No pulmonary edema.  Atherosclerotic calcifications of thoracic aorta again noted.  Persistent bilateral small pleural effusion with streaky bilateral basilar atelectasis or infiltrate right greater than left.  IMPRESSION: No pulmonary edema.  Atherosclerotic calcifications of thoracic aorta again  noted.  Persistent bilateral small pleural effusion with streaky bilateral basilar atelectasis or infiltrate right greater than left.   Original Report Authenticated By: Natasha Mead, M.D.    Assessment/Plan: Continue foley due to bladder outlet obstruction  Wesley [name] is a DNR and will be transferred to SNF when ok with medicine. With his age and co-morbidities recommend Wesley [name] maintain chronic foley and follow-up in our office at time of discharge. At age 45 no PSA recommended (1) age >19 and (2) false elevation secondary to foley. Wesley [name] will be at high risk of chronic UTI but recommend no ABX coverage unless Wesley [name] symptomatic ie: altered mental status, temp >100.5, or gross hematuria.   Elevated creatine improving with foley. Recommend Renal US to evaluate for bilateral hydronephrosis.    LOS: 2 days   [facility] 07/14/2013, 11:28 AM

## 2013-07-14 NOTE — Clinical Social Work Placement (Addendum)
    Clinical Social Work Department CLINICAL SOCIAL WORK PLACEMENT NOTE 07/20/2013  Patient:  CHARLE, CLEAR  Account Number:  [id number] Admit date:  07/12/2013  Clinical Social Worker:  Lupita Leash Mohsen Odenthal, LCSWA  Date/time:  07/14/2013 03:13 PM  Clinical Social Work is seeking post-discharge placement for this patient at the following level of care:   SKILLED NURSING   (*CSW will update this form in Epic as items are completed)   07/14/2013  Patient/family provided with [facility] Department of Clinical Social Work's list of facilities offering this level of care within the geographic area requested by the patient (or if unable, by the patient's family).  07/14/2013  Patient/family informed of their freedom to choose among providers that offer the needed level of care, that participate in Medicare, Medicaid or managed care program needed by the patient, have an available bed and are willing to accept the patient.  07/14/2013  Patient/family informed of [facility]' ownership interest in [facility], as well as of the fact that they are under no obligation to receive care at this facility.  PASARR submitted to EDS on 07/14/2013 PASARR number received from EDS on 07/14/2013  FL2 transmitted to all facilities in geographic area requested by pt/family on  07/14/2013 FL2 transmitted to all facilities within larger geographic area on   Patient informed that his/her managed care company has contracts with or will negotiate with  certain facilities, including the following:   NA     Patient/family informed of bed offers received:  07/17/2013 Patient chooses bed at [facility] Physician recommends and patient chooses bed at    Patient to be transferred to [facility] on  07/17/2013 Patient to be transferred to facility by Ambulance  Sharin Mons)  The following physician request were entered in Epic:   Additional Comments: 07/17/13  Patient and his  grandsone requested placement at [facility] and ok per MD for d/c there today.  Patient requested that his cousine [name] (c) [phone] sign his admission papers at the facility.  He did not wish to sign hiimself in.  Patient gave permission for his grandson Reggoe Willeen Cass to be contacted re: d/c today. Nursing was notified and called report to SNF. CSW signing off.  Lorri Frederick. Lerlene Treadwell, LCSWA [phone]

## 2013-07-14 NOTE — Progress Notes (Signed)
Pt resting comfortable on bed, very anxious at this time.  Pt is calling for help and require help right away, pt oriented and encouraged that we will be on his room ASAP and that we will round on him every hour to help him.We'll continue with POC.

## 2013-07-14 NOTE — Progress Notes (Signed)
Pt is alert and oriented x3. Pt has no complaints of pain at this time. Pt has worked with PT today. Pt has a foley.

## 2013-07-14 NOTE — Progress Notes (Signed)
Subjective: Mr. Cisse is somewhat subdued and sad but accepting of the need for SNF. No c/o chest pain or SOB  Objective: Lab:  Recent Labs  07/12/13 1853 07/13/13 0350 07/14/13 0500  WBC 6.5 5.7 7.0  NEUTROABS 5.0 4.2 5.4  HGB 9.5* 9.2* 10.3*  HCT 26.0* 26.0* 28.5*  MCV 100.0 100.8* 99.3  PLT 276 214 257    Recent Labs  07/12/13 1853 07/12/13 2144 07/13/13 0350 07/14/13 0500  NA 134*  --  134* 133*  K 4.7 3.4* 3.3* 3.3*  CL 95*  --  96 94*  GLUCOSE 107*  --  100* 105*  BUN 18  --  18 15  CREATININE 1.51*  --  1.48* 1.27  CALCIUM 8.3*  --  8.0* 8.2*   procalcitonin 0.56  Imaging:  Scheduled Meds: . aspirin EC  81 mg Oral Daily  . feeding supplement  237 mL Oral BID BM  . furosemide  40 mg Oral BID  . sodium chloride  3 mL Intravenous Q12H  . sodium chloride  3 mL Intravenous Q12H   Continuous Infusions:  PRN Meds:.acetaminophen, acetaminophen, food thickener, ondansetron (ZOFRAN) IV, ondansetron   Physical Exam: Filed Vitals:   07/14/13 0744  BP: 119/61  Pulse: 107  Temp: 98.4 F (36.9 C)  Resp: 20  gen'l - gaunt AA man in no distress HEENT- temporal wasting noted Cor - 2+ radial, distal heart sounds, RRR Pulm - no increased work of breathing. No rales or wheezing Abd - scaphoid, BS + Derm - wound left distal LE - no evidence of infection      Assessment/Plan: 1. ID - afebrile, normal WBC. Procalcitonin low.  2. Renal - creatinine 1.27! Urology consult called  3. Cardiac - no distress. No rales. Last BNP elevated and chest x-ray with interstial edema. Plan [facility] consult call placed  Continue furosemide bid  BMet and BNP in AM  F/u CXR in AM  Dispo - SNF - will get follow up PT eval   Illene Regulus [facility] (o) [phone]; (c) [phone] Call-grp - Patsi Sears IM  Tele: [phone]  07/14/2013, 10:39 AM

## 2013-07-14 NOTE — Clinical Social Work Psychosocial (Signed)
     Clinical Social Work Department BRIEF PSYCHOSOCIAL ASSESSMENT 07/14/2013  Patient:  Wesley [name], Wesley [name]     Account Number:  [id number]     Admit date:  07/12/2013  Clinical Social Worker:  Tiburcio Pea  Date/Time:  07/14/2013 02:50 PM  Referred by:  Physician  Date Referred:  07/13/2013 Referred for  SNF Placement   Other Referral:   Interview type:  Other - See comment Other interview type:   Patient and spoke to grandson Reggie via phone    PSYCHOSOCIAL DATA Living Status:  WIFE Admitted from facility:   Level of care:   Primary support name:  Jeri Modena  [phone] Primary support relationship to patient:  FAMILY Degree of support available:   Grandson and patient states he will make all decisions    CURRENT CONCERNS Current Concerns  Post-Acute Placement   Other Concerns:    SOCIAL WORK ASSESSMENT / PLAN 79 year old male- admitted from home where he lives with his wife who recently was released from [facility].  Per Physial Therapy, patient is recommended for short term SNF. CSW met with patient - he is agreeable and wants to go to [facility] where his wife received her therapy. He defers all decisions to his grandson Reggie. CSW contacted Reggie who confirmed same but agrees to full bed seach in case [facility] cannot accept patient.  Fl2 will be placed on chart for MD's signature.  Bed search initiated.   Assessment/plan status:  Psychosocial Support/Ongoing Assessment of Needs Other assessment/ plan:   Information/referral to community resources:   SNF bed list provided to patient    PATIENTS/FAMILYS RESPONSE TO PLAN OF CARE: Patient is alert and oriented; he is agreeable to short term SNF placement as is his grandson Reggie.  CSW left message for Marylene Land- Admissions at [facility] to review patient for possible admission. CSW will assist with placement when medically stable. Patient and grandson have a very positive attitude regarding SNF  placement; stated that patient's wife received very good care and therapy there and grandson feels that that patinet will do well there.

## 2013-07-14 NOTE — Consult Note (Signed)
Reason for Consult: CHF Referring Physician: TRH  HPI: The patient is a 79 y/o AAM, who has been followed by Dr. Alanda Amass in the past for PVD. Past cardiovascular records are not in EMR, but the patient denies any prior cardiac issues. [facility] has been consulted to evaluate for suspected acute HF. He was recently discharged on 07/10/13 after being admitted for a fall with rhabdomyolysis and acute renal failure secondary to bladder outlet obstruction.  During that admission, he evidently developed acute diastolic HF.  An echo on 8/18 demonstrated normal systolic function with an EF of 60-65% and grade I diastolic dysfunction. His CXR was consistent with CHF. He was treated with diurtics and had improvement. His BNP on day of discharge, on 8/18, was 3,789.   He was readmitted 2 days later on 07/12/13 for weakness and unfortunately sustained a second fall at home. On this admission, he was found to be hypotensive and tachycardic and was given IVFs. It is now suspected that he is in acute HF and we have been asked to evaluate. He denies any SOB, no orthopnea/PNA and no chest pain.    Past Medical History  Diagnosis Date  . Osteoarthrosis, unspecified whether generalized or localized, other specified sites   . Acute kidney failure with other specified pathological lesion in kidney   . Prostatitis   . Postsurgical percutaneous transluminal coronary angioplasty status     weintraub  . Cataract extraction status `  . Peripheral vascular disease, unspecified   . Personal history of malignant neoplasm of larynx   . Elevated prostate specific antigen (PSA)   . Gout, unspecified   . Acute peptic ulcer with hemorrhage   . Unspecified essential hypertension   . Hyperlipidemia     Past Surgical History  Procedure Laterality Date  . Percutaneous translumional coronary angioplastty      stents to bilateral legs for PVD  . Cataract extraction    . Truncal vagotomy  06/2005  . Gastrojejunosotomy  06/2005  .  Cholecystectomy  06/2005    Family History  Problem Relation Age of Onset  . Heart disease Father   . Prostate cancer Brother   . Heart disease Brother     Social History:  reports that he has been smoking Cigarettes and Cigars.  He has been smoking about 0.50 packs per day. He does not have any smokeless tobacco history on file. He reports that  drinks alcohol. He reports that he does not use illicit drugs.  Allergies: No Known Allergies  Medications:  Prior to Admission:  Prescriptions prior to admission  Medication Sig Dispense Refill  . amLODipine (NORVASC) 5 MG tablet Take 5 mg by mouth daily.      Marland Kitchen aspirin EC 81 MG tablet Take 81 mg by mouth daily.      . feeding supplement (ENSURE COMPLETE) LIQD Take 237 mL by mouth 2 (two) times daily between meals.      . food thickener (THICK IT) POWD Use to make all liquids nectar thick consistancy  850 g  5  . metoprolol (LOPRESSOR) 50 MG tablet Take 50 mg by mouth daily.      . potassium chloride SA (K-DUR,KLOR-CON) 10 MEQ tablet Take 1 tablet (10 mEq total) by mouth daily.  30 tablet  5  . PRESCRIPTION MEDICATION Apply 1 application topically daily. Medicated cream for sores on legs        Results for orders placed during the hospital encounter of 07/12/13 (from the past 48 hour(s))  CBC WITH DIFFERENTIAL     Status: Abnormal   Collection Time    07/12/13  6:53 PM      Result Value Range   WBC 6.5  4.0 - 10.5 K/uL   RBC 2.60 (*) 4.22 - 5.81 MIL/uL   Hemoglobin 9.5 (*) 13.0 - 17.0 g/dL   HCT 16.1 (*) 09.6 - 04.5 %   MCV 100.0  78.0 - 100.0 fL   MCH 36.5 (*) 26.0 - 34.0 pg   MCHC 36.5 (*) 30.0 - 36.0 g/dL   RDW 40.9  81.1 - 91.4 %   Platelets 276  150 - 400 K/uL   Neutrophils Relative % 77  43 - 77 %   Lymphocytes Relative 14  12 - 46 %   Monocytes Relative 8  3 - 12 %   Eosinophils Relative 0  0 - 5 %   Basophils Relative 1  0 - 1 %   Neutro Abs 5.0  1.7 - 7.7 K/uL   Lymphs Abs 0.9  0.7 - 4.0 K/uL   Monocytes Absolute 0.5   0.1 - 1.0 K/uL   Eosinophils Absolute 0.0  0.0 - 0.7 K/uL   Basophils Absolute 0.1  0.0 - 0.1 K/uL   Smear Review LARGE PLATELETS PRESENT    COMPREHENSIVE METABOLIC PANEL     Status: Abnormal   Collection Time    07/12/13  6:53 PM      Result Value Range   Sodium 134 (*) 135 - 145 mEq/L   Potassium 4.7  3.5 - 5.1 mEq/L   Comment: HEMOLYSIS AT THIS LEVEL MAY AFFECT RESULT   Chloride 95 (*) 96 - 112 mEq/L   CO2 28  19 - 32 mEq/L   Glucose, Bld 107 (*) 70 - 99 mg/dL   BUN 18  6 - 23 mg/dL   Creatinine, Ser 7.82 (*) 0.50 - 1.35 mg/dL   Calcium 8.3 (*) 8.4 - 10.5 mg/dL   Total Protein 6.6  6.0 - 8.3 g/dL   Albumin 2.3 (*) 3.5 - 5.2 g/dL   AST 35  0 - 37 U/L   ALT 21  0 - 53 U/L   Comment: HEMOLYSIS AT THIS LEVEL MAY AFFECT RESULT   Alkaline Phosphatase 47  39 - 117 U/L   Comment: HEMOLYSIS AT THIS LEVEL MAY AFFECT RESULT   Total Bilirubin 0.7  0.3 - 1.2 mg/dL   GFR calc non Af Amer 39 (*) >90 mL/min   GFR calc Af Amer 45 (*) >90 mL/min   Comment: (NOTE)     The eGFR has been calculated using the CKD EPI equation.     This calculation has not been validated in all clinical situations.     eGFR's persistently <90 mL/min signify possible Chronic Kidney     Disease.  PRO B NATRIURETIC PEPTIDE     Status: Abnormal   Collection Time    07/12/13  6:53 PM      Result Value Range   Pro B Natriuretic peptide (BNP) 3985.0 (*) 0 - 450 pg/mL  TROPONIN I     Status: None   Collection Time    07/12/13  6:53 PM      Result Value Range   Troponin I <0.30  <0.30 ng/mL   Comment:            Due to the release kinetics of cTnI,     a negative result within the first hours     of the onset of symptoms  does not rule out     myocardial infarction with certainty.     If myocardial infarction is still suspected,     repeat the test at appropriate intervals.  CK     Status: None   Collection Time    07/12/13  6:53 PM      Result Value Range   Total CK 111  7 - 232 U/L   Comment: HEMOLYSIS AT  THIS LEVEL MAY AFFECT RESULT  CULTURE, BLOOD (ROUTINE X 2)     Status: None   Collection Time    07/12/13  8:48 PM      Result Value Range   Specimen Description BLOOD HAND LEFT     Special Requests BOTTLES DRAWN AEROBIC ONLY 4CC     Culture  Setup Time       Value: 07/13/2013 01:32     Performed at [facility]   Culture       Value:        BLOOD CULTURE RECEIVED NO GROWTH TO DATE CULTURE WILL BE HELD FOR 5 DAYS BEFORE ISSUING A FINAL NEGATIVE REPORT     Performed at [facility]   Report Status PENDING    CULTURE, BLOOD (ROUTINE X 2)     Status: None   Collection Time    07/12/13  8:56 PM      Result Value Range   Specimen Description BLOOD HAND LEFT     Special Requests BOTTLES DRAWN AEROBIC ONLY 4CC     Culture  Setup Time       Value: 07/13/2013 01:32     Performed at [facility]   Culture       Value:        BLOOD CULTURE RECEIVED NO GROWTH TO DATE CULTURE WILL BE HELD FOR 5 DAYS BEFORE ISSUING A FINAL NEGATIVE REPORT     Performed at [facility]   Report Status PENDING    LACTIC ACID, PLASMA     Status: None   Collection Time    07/12/13  8:59 PM      Result Value Range   Lactic Acid, Venous 1.4  0.5 - 2.2 mmol/L  URINALYSIS, ROUTINE W REFLEX MICROSCOPIC     Status: Abnormal   Collection Time    07/12/13  9:13 PM      Result Value Range   Color, Urine AMBER (*) YELLOW   Comment: BIOCHEMICALS MAY BE AFFECTED BY COLOR   APPearance CLOUDY (*) CLEAR   Specific Gravity, Urine 1.014  1.005 - 1.030   pH 7.5  5.0 - 8.0   Glucose, UA NEGATIVE  NEGATIVE mg/dL   Hgb urine dipstick SMALL (*) NEGATIVE   Bilirubin Urine NEGATIVE  NEGATIVE   Ketones, ur NEGATIVE  NEGATIVE mg/dL   Protein, ur 30 (*) NEGATIVE mg/dL   Urobilinogen, UA 0.2  0.0 - 1.0 mg/dL   Nitrite NEGATIVE  NEGATIVE   Leukocytes, UA NEGATIVE  NEGATIVE  URINE MICROSCOPIC-ADD ON     Status: Abnormal   Collection Time    07/12/13  9:13 PM      Result Value Range   Squamous  Epithelial / LPF RARE  RARE   WBC, UA 0-2  <3 WBC/hpf   RBC / HPF 3-6  <3 RBC/hpf   Bacteria, UA RARE  RARE   Casts HYALINE CASTS (*) NEGATIVE  POTASSIUM     Status: Abnormal   Collection Time    07/12/13  9:44 PM  Result Value Range   Potassium 3.4 (*) 3.5 - 5.1 mEq/L  MRSA PCR SCREENING     Status: None   Collection Time    07/13/13  1:33 AM      Result Value Range   MRSA by PCR NEGATIVE  NEGATIVE   Comment:            The GeneXpert MRSA Assay (FDA     approved for NASAL specimens     only), is one component of a     comprehensive MRSA colonization     surveillance program. It is not     intended to diagnose MRSA     infection nor to guide or     monitor treatment for     MRSA infections.  COMPREHENSIVE METABOLIC PANEL     Status: Abnormal   Collection Time    07/13/13  3:50 AM      Result Value Range   Sodium 134 (*) 135 - 145 mEq/L   Potassium 3.3 (*) 3.5 - 5.1 mEq/L   Chloride 96  96 - 112 mEq/L   CO2 27  19 - 32 mEq/L   Glucose, Bld 100 (*) 70 - 99 mg/dL   BUN 18  6 - 23 mg/dL   Creatinine, Ser 2.13 (*) 0.50 - 1.35 mg/dL   Calcium 8.0 (*) 8.4 - 10.5 mg/dL   Total Protein 6.2  6.0 - 8.3 g/dL   Albumin 2.0 (*) 3.5 - 5.2 g/dL   AST 19  0 - 37 U/L   ALT 17  0 - 53 U/L   Alkaline Phosphatase 53  39 - 117 U/L   Total Bilirubin 0.7  0.3 - 1.2 mg/dL   GFR calc non Af Amer 40 (*) >90 mL/min   GFR calc Af Amer 46 (*) >90 mL/min   Comment: (NOTE)     The eGFR has been calculated using the CKD EPI equation.     This calculation has not been validated in all clinical situations.     eGFR's persistently <90 mL/min signify possible Chronic Kidney     Disease.  CBC WITH DIFFERENTIAL     Status: Abnormal   Collection Time    07/13/13  3:50 AM      Result Value Range   WBC 5.7  4.0 - 10.5 K/uL   RBC 2.58 (*) 4.22 - 5.81 MIL/uL   Hemoglobin 9.2 (*) 13.0 - 17.0 g/dL   HCT 08.6 (*) 57.8 - 46.9 %   MCV 100.8 (*) 78.0 - 100.0 fL   MCH 35.7 (*) 26.0 - 34.0 pg   MCHC 35.4   30.0 - 36.0 g/dL   RDW 62.9  52.8 - 41.3 %   Platelets 214  150 - 400 K/uL   Neutrophils Relative % 75  43 - 77 %   Lymphocytes Relative 17  12 - 46 %   Monocytes Relative 7  3 - 12 %   Eosinophils Relative 1  0 - 5 %   Basophils Relative 0  0 - 1 %   Neutro Abs 4.2  1.7 - 7.7 K/uL   Lymphs Abs 1.0  0.7 - 4.0 K/uL   Monocytes Absolute 0.4  0.1 - 1.0 K/uL   Eosinophils Absolute 0.1  0.0 - 0.7 K/uL   Basophils Absolute 0.0  0.0 - 0.1 K/uL   RBC Morphology POLYCHROMASIA PRESENT     WBC Morphology TOXIC GRANULATION    PROCALCITONIN     Status: None  Collection Time    07/13/13  7:22 PM      Result Value Range   Procalcitonin 0.56     Comment:            Interpretation:     PCT > 0.5 ng/mL and <= 2 ng/mL:     Systemic infection (sepsis) is possible,     but other conditions are known to elevate     PCT as well.     (NOTE)             ICU PCT Algorithm               Non ICU PCT Algorithm        ----------------------------     ------------------------------             PCT < 0.25 ng/mL                 PCT < 0.1 ng/mL         Stopping of antibiotics            Stopping of antibiotics           strongly encouraged.               strongly encouraged.        ----------------------------     ------------------------------           PCT level decrease by               PCT < 0.25 ng/mL           >= 80% from peak PCT           OR PCT 0.25 - 0.5 ng/mL          Stopping of antibiotics                                                 encouraged.         Stopping of antibiotics               encouraged.        ----------------------------     ------------------------------           PCT level decrease by              PCT >= 0.25 ng/mL           < 80% from peak PCT            AND PCT >= 0.5 ng/mL            Continuing antibiotics                                                  encouraged.           Continuing antibiotics                encouraged.        ----------------------------      ------------------------------         PCT level increase compared          PCT > 0.5 ng/mL             with peak PCT  AND              PCT >= 0.5 ng/mL             Escalation of antibiotics                                              strongly encouraged.          Escalation of antibiotics            strongly encouraged.  BASIC METABOLIC PANEL     Status: Abnormal   Collection Time    07/14/13  5:00 AM      Result Value Range   Sodium 133 (*) 135 - 145 mEq/L   Potassium 3.3 (*) 3.5 - 5.1 mEq/L   Chloride 94 (*) 96 - 112 mEq/L   CO2 29  19 - 32 mEq/L   Glucose, Bld 105 (*) 70 - 99 mg/dL   BUN 15  6 - 23 mg/dL   Creatinine, Ser 0.98  0.50 - 1.35 mg/dL   Calcium 8.2 (*) 8.4 - 10.5 mg/dL   GFR calc non Af Amer 48 (*) >90 mL/min   GFR calc Af Amer 55 (*) >90 mL/min   Comment: (NOTE)     The eGFR has been calculated using the CKD EPI equation.     This calculation has not been validated in all clinical situations.     eGFR's persistently <90 mL/min signify possible Chronic Kidney     Disease.  CBC WITH DIFFERENTIAL     Status: Abnormal   Collection Time    07/14/13  5:00 AM      Result Value Range   WBC 7.0  4.0 - 10.5 K/uL   RBC 2.87 (*) 4.22 - 5.81 MIL/uL   Hemoglobin 10.3 (*) 13.0 - 17.0 g/dL   HCT 11.9 (*) 14.7 - 82.9 %   MCV 99.3  78.0 - 100.0 fL   MCH 35.9 (*) 26.0 - 34.0 pg   MCHC 36.1 (*) 30.0 - 36.0 g/dL   RDW 56.2  13.0 - 86.5 %   Platelets 257  150 - 400 K/uL   Neutrophils Relative % 78 (*) 43 - 77 %   Lymphocytes Relative 14  12 - 46 %   Monocytes Relative 7  3 - 12 %   Eosinophils Relative 1  0 - 5 %   Basophils Relative 0  0 - 1 %   Neutro Abs 5.4  1.7 - 7.7 K/uL   Lymphs Abs 1.0  0.7 - 4.0 K/uL   Monocytes Absolute 0.5  0.1 - 1.0 K/uL   Eosinophils Absolute 0.1  0.0 - 0.7 K/uL   Basophils Absolute 0.0  0.0 - 0.1 K/uL   RBC Morphology TARGET CELLS     WBC Morphology TOXIC GRANULATION     Comment: ATYPICAL LYMPHOCYTES    Dg Chest 2 View  07/14/2013    *RADIOLOGY REPORT*  Clinical Data: Follow-up pulmonary edema  CHEST - 2 VIEW  Comparison: 07/12/2013  Findings: Changes of underlying COPD are evident with hyperinflation.  There has been near complete interval clearing of the interstitial edema since the prior exam. A small amount of residual pleural fluid is seen bilaterally and some minimal septal prominence may indicate some mild residual interstitial fluid on underlying chronic change. No significant fissural fluid is seen and no alveolar infiltrates  are identified.  The heart and mediastinal contours are unchanged with aortic ectasia and aortic calcification again identified.  Mild elevation of the left hemidiaphragm is stable.  IMPRESSION: Interval near complete resolution of interstitial edema with only minimal residual interstitial fluid suspected.   Original Report Authenticated By: Rhodia Albright, M.D.   Dg Chest Port 1 View  07/12/2013   *RADIOLOGY REPORT*  Clinical Data: Weakness  PORTABLE CHEST - 1 VIEW  Comparison: 07/11/2013  Findings: Cardiomediastinal silhouette is stable.  No pulmonary edema.  Atherosclerotic calcifications of thoracic aorta again noted.  Persistent bilateral small pleural effusion with streaky bilateral basilar atelectasis or infiltrate right greater than left.  IMPRESSION: No pulmonary edema.  Atherosclerotic calcifications of thoracic aorta again noted.  Persistent bilateral small pleural effusion with streaky bilateral basilar atelectasis or infiltrate right greater than left.   Original Report Authenticated By: Natasha Mead, M.D.    Review of Systems  Respiratory: Positive for cough and sputum production. Negative for shortness of breath.   Cardiovascular: Negative for chest pain, orthopnea and PND.  Musculoskeletal: Positive for falls.  Neurological: Positive for weakness.  All other systems reviewed and are negative.   Blood pressure 97/61, pulse 85, temperature 97.8 F (36.6 C), temperature source Oral, resp.  rate 20, height 5\' 11"  (1.803 m), weight 129 lb 3 oz (58.6 kg), SpO2 100.00%. Physical Exam  Constitutional: He is oriented to person, place, and time. He appears well-developed and well-nourished. No distress.  Neck: No JVD present. Carotid bruit is not present.  Cardiovascular: Normal rate and intact distal pulses.  Exam reveals no gallop and no friction rub.   Murmur heard. Pulses:      Radial pulses are 2+ on the right side, and 2+ on the left side.       Dorsalis pedis pulses are 1+ on the right side, and 1+ on the left side.  Respiratory: Effort normal and breath sounds normal. No respiratory distress. He has no wheezes. He has no rales.  GI: Soft. Bowel sounds are normal. He exhibits no distension and no mass. There is no tenderness.  Musculoskeletal: He exhibits no edema.  Neurological: He is alert and oriented to person, place, and time.  Skin: Skin is warm and dry. He is not diaphoretic.  + bilateral venous stasis dermatitis and ulcers  Psychiatric: He has a normal mood and affect. His behavior is normal.    Assessment/Plan: Principal Problem:   Weakness Active Problems:   Hypotension   Healthcare-associated pneumonia   Renal failure   CHF (congestive heart failure)  Plan: Consult placed for evaluation of acute/chronic diastolic HF. Pt apparently had acute HF during initial admission several days ago [facility] not consulted at that time). He apparently improved after diuresis. He has been readmitted for weakness and a fall. He does not appear to be in profound acute HF at this time, based on physical exam, CXR and breathing status. He denies SOB. O2 stats are 100% on RA. No peripheral edema noted on exam. Lungs are fairly clear bilaterally. His most recent BNP on 8/20 was 3,985 (compared to 3,789 on 8/18). This is also in the setting of CRI. He is currently on 40 mg PO lasix BID. I see no need for IV diuretics at this time. Continue with PO regimen. Recommend supplemental potassium  for hypokalemia. MD to follow.     Allayne Butcher, PA-C 07/14/2013, 4:21 PM

## 2013-07-14 NOTE — Evaluation (Signed)
Physical Therapy Evaluation Patient Details Name: Wesley [name] MRN: [id number] DOB: [birth year] Today's Date: 07/14/2013 Time: 8295-6213 PT Time Calculation (min): 22 min  PT Assessment / Plan / Recommendation History of Present Illness  Wesley [name] is a 79 y.o. male who was discharged yesterday after being admitted for fall with rhabdomyolysis and acute renal failure secondary to bladder outlet obstruction presents to the ER because of weakness and unable to ambulate. Patient at the time of discharge was advised to skilled nursing facility which patient had refused. In the ER patient was found to be hypotensive with tachycardia and a fluid bolus was given with the improvement in blood pressure. Patient's chest x-ray shows possible infiltrates and during recent admission patient also was treated for pneumonia. Patient presently will be admitted for further management. Patient has been empirically started on antibiotics for pneumonia and will be given fluid boluses for hypotension given the history of CHF. Patient denies any chest pain shortness of breath nausea vomiting abdominal pain diarrhea focal deficits. Patient's labs are pending.  Clinical Impression  Pt admitted with the above. Pt currently with functional limitations due to the deficits listed below (see PT Problem List). Pt recently discharged home however PT recommend SNF and pt refused SNF.  Pt now readmitted due to the above and now agreeable to SNF.  Pt will benefit from skilled PT to increase their independence and safety with mobility to allow discharge to the venue listed below.     PT Assessment  Patient needs continued PT services    Follow Up Recommendations  SNF    Barriers to Discharge Decreased caregiver support      Equipment Recommendations  None recommended by PT    Frequency Min 3X/week    Precautions / Restrictions Precautions Precautions: Fall   Pertinent Vitals/Pain C/o abdominal pain but does not rate       Mobility  Bed Mobility Bed Mobility: Supine to Sit;Sitting - Scoot to Edge of Bed Supine to Sit: 4: Min assist;With rails;HOB elevated Details for Bed Mobility Assistance: (A) with LE OOB with cues for technique Transfers Transfers: Sit to Stand;Stand to Sit Sit to Stand: With upper extremity assist;With armrests;From bed;4: Min assist Stand to Sit: 4: Min guard;With upper extremity assist;With armrests;To bed Details for Transfer Assistance: (A) to initiate transfer with cues for hand placement Ambulation/Gait Ambulation/Gait Assistance: 4: Min guard Ambulation Distance (Feet): 30 Feet Assistive device: Rolling walker Ambulation/Gait Assistance Details: Minguard for safety with cues for RW position Gait Pattern: Step-through pattern;Shuffle;Trunk flexed Gait velocity: decreased Stairs: No Wheelchair Mobility Wheelchair Mobility: No    Exercises     PT Diagnosis: Difficulty walking  PT Problem List: Decreased strength;Decreased activity tolerance;Decreased balance;Decreased mobility;Decreased coordination;Decreased knowledge of use of DME;Decreased safety awareness;Pain PT Treatment Interventions: DME instruction;Gait training;Functional mobility training;Therapeutic activities;Therapeutic exercise;Balance training;Patient/family education     PT Goals(Current goals can be found in the care plan section) Acute Rehab PT Goals Patient Stated Goal: to return home but I'll do whatever you say PT Goal Formulation: With patient Time For Goal Achievement: 07/21/13 Potential to Achieve Goals: Good  Visit Information  Last PT Received On: 07/14/13 Assistance Needed: +1 History of Present Illness: Wesley [name] is a 79 y.o. male who was discharged yesterday after being admitted for fall with rhabdomyolysis and acute renal failure secondary to bladder outlet obstruction presents to the ER because of weakness and unable to ambulate. Patient at the time of discharge was advised to  skilled nursing facility which patient had refused.  In the ER patient was found to be hypotensive with tachycardia and a fluid bolus was given with the improvement in blood pressure. Patient's chest x-ray shows possible infiltrates and during recent admission patient also was treated for pneumonia. Patient presently will be admitted for further management. Patient has been empirically started on antibiotics for pneumonia and will be given fluid boluses for hypotension given the history of CHF. Patient denies any chest pain shortness of breath nausea vomiting abdominal pain diarrhea focal deficits. Patient's labs are pending.       Prior Functioning  Home Living Family/patient expects to be discharged to:: Private residence Living Arrangements: Alone Available Help at Discharge: Family;Friend(s);Available 24 hours/day Type of Home: Apartment Home Access: Stairs to enter Entrance Stairs-Number of Steps: 1 Home Layout: One level Home Equipment: Walker - 2 wheels;Cane - single point;Bedside commode Additional Comments: Pt seemed unsure of having 24 hour assistance available when asked by PT.  Pt recently d/c 07/12/13 and readmitted same day due to unable to ambulate in house. Prior Function Level of Independence: Independent with assistive device(s) ADL's / Homemaking Assistance Needed: Pt does not drive, but family/friends take him where he needs to go.  Hires someone to assist with housekeeping. Comments: Pt reports he goes to the liquor store every day with his cousin.  States his "drinking buddy" could probably stay at home with him 24/7. Also reports h/o at least 2 falls prior to the fall that resulted in his admission to hospital. Communication Communication: No difficulties    Cognition  Cognition Arousal/Alertness: Awake/alert Behavior During Therapy: WFL for tasks assessed/performed Overall Cognitive Status: Impaired/Different from baseline Area of Impairment:  Safety/judgement;Awareness Safety/Judgement: Decreased awareness of safety;Decreased awareness of deficits General Comments: Pt more agreeable to SNF at this time due to readmission from generalized weakness.    Extremity/Trunk Assessment Lower Extremity Assessment Lower Extremity Assessment: Generalized weakness Cervical / Trunk Assessment Cervical / Trunk Assessment: Kyphotic   Balance Static Sitting Balance Static Sitting - Balance Support: Bilateral upper extremity supported;Feet supported Static Sitting - Level of Assistance: 5: Stand by assistance Static Standing Balance Static Standing - Balance Support: Bilateral upper extremity supported Static Standing - Level of Assistance: 4: Min assist  End of Session PT - End of Session Equipment Utilized During Treatment: Gait belt;Oxygen Activity Tolerance: Patient limited by fatigue Patient left: in chair;with call bell/phone within reach Nurse Communication: Mobility status  GP     Derrill Bagnell 07/14/2013, 12:47 PM  Jake Shark, PT DPT [phone]

## 2013-07-14 NOTE — Consult Note (Signed)
Pt. Seen and examined. Agree with the NP/PA-C note as written. Very pleasant 79 yo gentleman with some diastolic HF in the recent past. He has CKD and appears euvolemic on his current diuretics. BNP is elevated, but stable. Neck veins are not distended. There is no peripheral edema or ascites that I can appreciate. He does have some rhonchi with cough, suggestive of bronchial mucous -no rales are appreciated. I would recommend continuing his current diuretic regimen, measuring daily weights and watching I's/O's to keep him euvolemic if not net negative.   Thanks for consulting [place]. Please contact us if you need further assistance.  Chrystie Nose, MD, [facility] Attending Cardiologist [facility]

## 2013-07-14 NOTE — Progress Notes (Signed)
Report given to receiving RN. Patient is stable. No signs or symptoms of distress or discomfort.  

## 2013-07-14 NOTE — Telephone Encounter (Signed)
Attempted transitional care call:  No answer at residence.

## 2013-07-14 NOTE — Care Management Note (Signed)
    Page 1 of 1   07/14/2013     6:51:21 AM   CARE MANAGEMENT NOTE 07/14/2013  Patient:  Wesley [name], Wesley [name]   Account Number:  [id number]  Date Initiated:  07/14/2013  Documentation initiated by:  Alvira Philips Assessment:   79 yr-old male adm with dx of weakness and hypotension; lives alone, reports he has a caregiver who visits daily     DC Planning Services  CM consult      Per UR Regulation:  Reviewed for med. necessity/level of care/duration of stay  Comments:  PCP: Dr Illene Regulus  07/14/13 0641 Verdis Prime RN MSN BSN CCM Pt discharged home on 8/20, readmitted same day.  ST SNF for rehab was recommended @ time of previous discharge, but pt refused.  Referral was made to [facility] for RN, PT, OT, and SW - pt was readmitted prior to start of services.

## 2013-07-15 LAB — BASIC METABOLIC PANEL
BUN: 14 mg/dL (ref 6–23)
CO2: 28 mEq/L (ref 19–32)
Chloride: 94 mEq/L — ABNORMAL LOW (ref 96–112)
GFR calc Af Amer: 47 mL/min — ABNORMAL LOW (ref >90)
Glucose, Bld: 98 mg/dL (ref 70–99)
Potassium: 3.3 mEq/L — ABNORMAL LOW (ref 3.5–5.1)

## 2013-07-15 MED ORDER — POTASSIUM CHLORIDE CRYS ER 20 MEQ PO TBCR
20.0000 meq | EXTENDED_RELEASE_TABLET | Freq: Two times a day (BID) | ORAL | Status: DC
  Administered 2013-07-15 – 2013-07-17 (×5): 20 meq via ORAL
  Filled 2013-07-15 (×7): qty 1

## 2013-07-15 NOTE — Progress Notes (Signed)
Subjective: Breathing okay today.  No distress.  No significant pain  Objective: Weight change:   Intake/Output Summary (Last 24 hours) at 07/15/13 1359 Last data filed at 07/14/13 1832  Gross per 24 hour  Intake    240 ml  Output      0 ml  Net    240 ml   Filed Vitals:   07/14/13 1335 07/14/13 1852 07/14/13 2140 07/15/13 0509  BP: 97/61 116/62 108/58 127/45  Pulse: 85 106 110 56  Temp: 97.8 F (36.6 C)  97.8 F (36.6 C) 98.2 F (36.8 C)  TempSrc: Oral  Oral Oral  Resp: 20  18 18   Height:      Weight:    56.7 kg (125 lb)  SpO2: 100%  97% 100%    General Appearance: Alert, cooperative, no distress, cachectic-appearing  Lungs: Clear to auscultation bilaterally, respirations unlabored Heart: Regular rhythm with no murmurs or gallops Abdomen: Soft, non-tender, bowel sounds active all four quadrants, no masses, no organomegaly Extremities: Extremities normal, atraumatic, no cyanosis or edema Neuro: Alert and oriented x3, nonfocal  Lab Results: Results for orders placed during the hospital encounter of 07/12/13 (from the past 48 hour(s))  PROCALCITONIN     Status: None   Collection Time    07/13/13  7:22 PM      Result Value Range   Procalcitonin 0.56     Comment:            Interpretation:     PCT > 0.5 ng/mL and <= 2 ng/mL:     Systemic infection (sepsis) is possible,     but other conditions are known to elevate     PCT as well.     (NOTE)             ICU PCT Algorithm               Non ICU PCT Algorithm        ----------------------------     ------------------------------             PCT < 0.25 ng/mL                 PCT < 0.1 ng/mL         Stopping of antibiotics            Stopping of antibiotics           strongly encouraged.               strongly encouraged.        ----------------------------     ------------------------------           PCT level decrease by               PCT < 0.25 ng/mL           >= 80% from peak PCT           OR PCT 0.25 - 0.5 ng/mL           Stopping of antibiotics                                                 encouraged.         Stopping of antibiotics               encouraged.        ----------------------------     ------------------------------  PCT level decrease by              PCT >= 0.25 ng/mL           < 80% from peak PCT            AND PCT >= 0.5 ng/mL            Continuing antibiotics                                                  encouraged.           Continuing antibiotics                encouraged.        ----------------------------     ------------------------------         PCT level increase compared          PCT > 0.5 ng/mL             with peak PCT AND              PCT >= 0.5 ng/mL             Escalation of antibiotics                                              strongly encouraged.          Escalation of antibiotics            strongly encouraged.  BASIC METABOLIC PANEL     Status: Abnormal   Collection Time    07/14/13  5:00 AM      Result Value Range   Sodium 133 (*) 135 - 145 mEq/L   Potassium 3.3 (*) 3.5 - 5.1 mEq/L   Chloride 94 (*) 96 - 112 mEq/L   CO2 29  19 - 32 mEq/L   Glucose, Bld 105 (*) 70 - 99 mg/dL   BUN 15  6 - 23 mg/dL   Creatinine, Ser 0.34  0.50 - 1.35 mg/dL   Calcium 8.2 (*) 8.4 - 10.5 mg/dL   GFR calc non Af Amer 48 (*) >90 mL/min   GFR calc Af Amer 55 (*) >90 mL/min   Comment: (NOTE)     The eGFR has been calculated using the CKD EPI equation.     This calculation has not been validated in all clinical situations.     eGFR's persistently <90 mL/min signify possible Chronic Kidney     Disease.  CBC WITH DIFFERENTIAL     Status: Abnormal   Collection Time    07/14/13  5:00 AM      Result Value Range   WBC 7.0  4.0 - 10.5 K/uL   RBC 2.87 (*) 4.22 - 5.81 MIL/uL   Hemoglobin 10.3 (*) 13.0 - 17.0 g/dL   HCT 74.2 (*) 59.5 - 63.8 %   MCV 99.3  78.0 - 100.0 fL   MCH 35.9 (*) 26.0 - 34.0 pg   MCHC 36.1 (*) 30.0 - 36.0 g/dL   RDW 75.6  43.3 - 29.5 %    Platelets 257  150 - 400 K/uL   Neutrophils Relative % 78 (*) 43 - 77 %   Lymphocytes Relative  14  12 - 46 %   Monocytes Relative 7  3 - 12 %   Eosinophils Relative 1  0 - 5 %   Basophils Relative 0  0 - 1 %   Neutro Abs 5.4  1.7 - 7.7 K/uL   Lymphs Abs 1.0  0.7 - 4.0 K/uL   Monocytes Absolute 0.5  0.1 - 1.0 K/uL   Eosinophils Absolute 0.1  0.0 - 0.7 K/uL   Basophils Absolute 0.0  0.0 - 0.1 K/uL   RBC Morphology TARGET CELLS     WBC Morphology TOXIC GRANULATION     Comment: ATYPICAL LYMPHOCYTES  PROCALCITONIN     Status: None   Collection Time    07/15/13  4:50 AM      Result Value Range   Procalcitonin 0.30     Comment:            Interpretation:     PCT (Procalcitonin) <= 0.5 ng/mL:     Systemic infection (sepsis) is not likely.     Local bacterial infection is possible.     (NOTE)             ICU PCT Algorithm               Non ICU PCT Algorithm        ----------------------------     ------------------------------             PCT < 0.25 ng/mL                 PCT < 0.1 ng/mL         Stopping of antibiotics            Stopping of antibiotics           strongly encouraged.               strongly encouraged.        ----------------------------     ------------------------------           PCT level decrease by               PCT < 0.25 ng/mL           >= 80% from peak PCT           OR PCT 0.25 - 0.5 ng/mL          Stopping of antibiotics                                                 encouraged.         Stopping of antibiotics               encouraged.        ----------------------------     ------------------------------           PCT level decrease by              PCT >= 0.25 ng/mL           < 80% from peak PCT            AND PCT >= 0.5 ng/mL            Continuing antibiotics  encouraged.           Continuing antibiotics                encouraged.        ----------------------------     ------------------------------          PCT level increase compared          PCT > 0.5 ng/mL             with peak PCT AND              PCT >= 0.5 ng/mL             Escalation of antibiotics                                              strongly encouraged.          Escalation of antibiotics            strongly encouraged.  BASIC METABOLIC PANEL     Status: Abnormal   Collection Time    07/15/13  4:50 AM      Result Value Range   Sodium 136  135 - 145 mEq/L   Potassium 3.3 (*) 3.5 - 5.1 mEq/L   Chloride 94 (*) 96 - 112 mEq/L   CO2 28  19 - 32 mEq/L   Glucose, Bld 98  70 - 99 mg/dL   BUN 14  6 - 23 mg/dL   Creatinine, Ser 1.47 (*) 0.50 - 1.35 mg/dL   Calcium 8.1 (*) 8.4 - 10.5 mg/dL   GFR calc non Af Amer 41 (*) >90 mL/min   GFR calc Af Amer 47 (*) >90 mL/min   Comment: (NOTE)     The eGFR has been calculated using the CKD EPI equation.     This calculation has not been validated in all clinical situations.     eGFR's persistently <90 mL/min signify possible Chronic Kidney     Disease.  PRO B NATRIURETIC PEPTIDE     Status: Abnormal   Collection Time    07/15/13  4:50 AM      Result Value Range   Pro B Natriuretic peptide (BNP) 1261.0 (*) 0 - 450 pg/mL    Studies/Results: Dg Chest 2 View  07/14/2013   *RADIOLOGY REPORT*  Clinical Data: Follow-up pulmonary edema  CHEST - 2 VIEW  Comparison: 07/12/2013  Findings: Changes of underlying COPD are evident with hyperinflation.  There has been near complete interval clearing of the interstitial edema since the prior exam. A small amount of residual pleural fluid is seen bilaterally and some minimal septal prominence may indicate some mild residual interstitial fluid on underlying chronic change. No significant fissural fluid is seen and no alveolar infiltrates are identified.  The heart and mediastinal contours are unchanged with aortic ectasia and aortic calcification again identified.  Mild elevation of the left hemidiaphragm is stable.  IMPRESSION: Interval near complete  resolution of interstitial edema with only minimal residual interstitial fluid suspected.   Original Report Authenticated By: Rhodia Albright, M.D.   Medications: Scheduled Meds: . aspirin EC  81 mg Oral Daily  . feeding supplement  237 mL Oral BID BM  . furosemide  40 mg Oral BID  . potassium chloride  20 mEq Oral BID  . sodium chloride  3 mL Intravenous Q12H  . sodium chloride  3 mL Intravenous Q12H   Continuous Infusions:  PRN Meds:.acetaminophen, acetaminophen, food thickener, ondansetron (ZOFRAN) IV, ondansetron  Assessment/Plan: Principal Problem:   Weakness - multifactorial Active Problems:   Hypotension - likely from dehydration but does not tolerate aggressive hydration either secondary to diastolic dysfunction   Healthcare-associated pneumonia - resolved   Renal failure - resolved   CHF (congestive heart failure) - appreciate cardiology consultation.  We'll try to keep euvolemic   Hypokalemia - start potassium supplementation with ongoing Lasix therapy and check basic metabolic profile in a.m.   Urinary retention - chronic Foley for now and followup with Dr. Brunilda Payor after hospitalization   Decreased mood - okay today   CODE STATUS: No CODE BLUE   Disposition - skilled nursing facility placement    LOS: 3 days   Pearla Dubonnet, MD 07/15/2013, 1:59 PM

## 2013-07-15 NOTE — Progress Notes (Signed)
Pt resting comfortable, VSS, no c/o pain or discomfort no distress noticed. Foley catheter still in place, draining clear urine at this time. We'll continue with POC.

## 2013-07-16 LAB — BASIC METABOLIC PANEL
BUN: 16 mg/dL (ref 6–23)
CO2: 26 mEq/L (ref 19–32)
Chloride: 92 mEq/L — ABNORMAL LOW (ref 96–112)
GFR calc Af Amer: 53 mL/min — ABNORMAL LOW (ref >90)
Glucose, Bld: 106 mg/dL — ABNORMAL HIGH (ref 70–99)
Potassium: 3.5 mEq/L (ref 3.5–5.1)

## 2013-07-16 NOTE — Progress Notes (Addendum)
Subjective: Patient is feeling better today and without shortness of breath.  Renal function is stable with negative fluid balance of 1300 cc over the past 24 hours.  Potassium has been added to his regimen and potassium is normal today.  Continue to follow bmet and BNP daily.  Patient has diastolic dysfunction with an ejection fraction of 60-65% per 2-D echocardiogram on 07/10/2013.  ProBNP is much improved - down to 1261, from greater than 3000.  Objective: Weight change: -3.3 kg (-7 lb 4.4 oz)  Intake/Output Summary (Last 24 hours) at 07/16/13 0832 Last data filed at 07/16/13 0431  Gross per 24 hour  Intake    760 ml  Output   2050 ml  Net  -1290 ml   Filed Vitals:   07/15/13 0509 07/15/13 1412 07/15/13 2203 07/16/13 0541  BP: 127/45 106/56 109/62 120/68  Pulse: 56 106 99 97  Temp: 98.2 F (36.8 C) 98.3 F (36.8 C) 98.2 F (36.8 C) 98.1 F (36.7 C)  TempSrc: Oral Oral Oral Oral  Resp: 18 19 20 20   Height:      Weight: 56.7 kg (125 lb)   55.3 kg (121 lb 14.6 oz)  SpO2: 100% 95% 96% 94%   General Appearance: Alert, cooperative, no distress, cachectic-appearing  Lungs: Clear to auscultation bilaterally, respirations unlabored  Heart: Regular rhythm with no murmurs or gallops  Abdomen: Soft, non-tender, bowel sounds active all four quadrants, no masses, no organomegaly  Extremities: Extremities normal, atraumatic, no cyanosis or edema  Neuro: Alert and oriented x3, nonfocal  Lab Results: Results for orders placed during the hospital encounter of 07/12/13 (from the past 48 hour(s))  PROCALCITONIN     Status: None   Collection Time    07/15/13  4:50 AM      Result Value Range   Procalcitonin 0.30     Comment:            Interpretation:     PCT (Procalcitonin) <= 0.5 ng/mL:     Systemic infection (sepsis) is not likely.     Local bacterial infection is possible.     (NOTE)             ICU PCT Algorithm               Non ICU PCT Algorithm         ----------------------------     ------------------------------             PCT < 0.25 ng/mL                 PCT < 0.1 ng/mL         Stopping of antibiotics            Stopping of antibiotics           strongly encouraged.               strongly encouraged.        ----------------------------     ------------------------------           PCT level decrease by               PCT < 0.25 ng/mL           >= 80% from peak PCT           OR PCT 0.25 - 0.5 ng/mL          Stopping of antibiotics  encouraged.         Stopping of antibiotics               encouraged.        ----------------------------     ------------------------------           PCT level decrease by              PCT >= 0.25 ng/mL           < 80% from peak PCT            AND PCT >= 0.5 ng/mL            Continuing antibiotics                                                  encouraged.           Continuing antibiotics                encouraged.        ----------------------------     ------------------------------         PCT level increase compared          PCT > 0.5 ng/mL             with peak PCT AND              PCT >= 0.5 ng/mL             Escalation of antibiotics                                              strongly encouraged.          Escalation of antibiotics            strongly encouraged.  BASIC METABOLIC PANEL     Status: Abnormal   Collection Time    07/15/13  4:50 AM      Result Value Range   Sodium 136  135 - 145 mEq/L   Potassium 3.3 (*) 3.5 - 5.1 mEq/L   Chloride 94 (*) 96 - 112 mEq/L   CO2 28  19 - 32 mEq/L   Glucose, Bld 98  70 - 99 mg/dL   BUN 14  6 - 23 mg/dL   Creatinine, Ser 4.09 (*) 0.50 - 1.35 mg/dL   Calcium 8.1 (*) 8.4 - 10.5 mg/dL   GFR calc non Af Amer 41 (*) >90 mL/min   GFR calc Af Amer 47 (*) >90 mL/min   Comment: (NOTE)     The eGFR has been calculated using the CKD EPI equation.     This calculation has not been validated in all clinical  situations.     eGFR's persistently <90 mL/min signify possible Chronic Kidney     Disease.  PRO B NATRIURETIC PEPTIDE     Status: Abnormal   Collection Time    07/15/13  4:50 AM      Result Value Range   Pro B Natriuretic peptide (BNP) 1261.0 (*) 0 - 450 pg/mL  BASIC METABOLIC PANEL     Status: Abnormal   Collection Time    07/16/13  5:20 AM      Result Value Range   Sodium  133 (*) 135 - 145 mEq/L   Potassium 3.5  3.5 - 5.1 mEq/L   Chloride 92 (*) 96 - 112 mEq/L   CO2 26  19 - 32 mEq/L   Glucose, Bld 106 (*) 70 - 99 mg/dL   BUN 16  6 - 23 mg/dL   Creatinine, Ser 1.47  0.50 - 1.35 mg/dL   Calcium 8.1 (*) 8.4 - 10.5 mg/dL   GFR calc non Af Amer 45 (*) >90 mL/min   GFR calc Af Amer 53 (*) >90 mL/min   Comment: (NOTE)     The eGFR has been calculated using the CKD EPI equation.     This calculation has not been validated in all clinical situations.     eGFR's persistently <90 mL/min signify possible Chronic Kidney     Disease.  PRO B NATRIURETIC PEPTIDE     Status: Abnormal   Collection Time    07/16/13  5:20 AM      Result Value Range   Pro B Natriuretic peptide (BNP) 1261.0 (*) 0 - 450 pg/mL    Studies/Results: Dg Chest 2 View  07/14/2013   *RADIOLOGY REPORT*  Clinical Data: Follow-up pulmonary edema  CHEST - 2 VIEW  Comparison: 07/12/2013  Findings: Changes of underlying COPD are evident with hyperinflation.  There has been near complete interval clearing of the interstitial edema since the prior exam. A small amount of residual pleural fluid is seen bilaterally and some minimal septal prominence may indicate some mild residual interstitial fluid on underlying chronic change. No significant fissural fluid is seen and no alveolar infiltrates are identified.  The heart and mediastinal contours are unchanged with aortic ectasia and aortic calcification again identified.  Mild elevation of the left hemidiaphragm is stable.  IMPRESSION: Interval near complete resolution of interstitial  edema with only minimal residual interstitial fluid suspected.   Original Report Authenticated By: Rhodia Albright, M.D.   Medications: Scheduled Meds: . aspirin EC  81 mg Oral Daily  . feeding supplement  237 mL Oral BID BM  . furosemide  40 mg Oral BID  . potassium chloride  20 mEq Oral BID  . sodium chloride  3 mL Intravenous Q12H  . sodium chloride  3 mL Intravenous Q12H   Continuous Infusions:  PRN Meds:.acetaminophen, acetaminophen, food thickener, ondansetron (ZOFRAN) IV, ondansetron  Assessment/Plan: Principal Problem:  Weakness - multifactorial - patient feeling better today Active Problems:  Hypotension - Improved, likely from dehydration but does not tolerate aggressive hydration either secondary to diastolic dysfunction  Healthcare-associated pneumonia - resolved  Renal failure - resolved - renal function stable with excellent diuresis yesterday.  May need to reduce diuresis over the next several days CHF (congestive heart failure) - appreciate cardiology consultation. We'll try to keep euvolemic at this point - may need to reduce Lasix and potassium to one dose daily each Hypokalemia - continue potassium supplementation with ongoing Lasix therapy and check basic metabolic profile daily while hospitalized Urinary retention - chronic Foley for now and followup with Dr. Brunilda Payor after hospitalization  Decreased mood - better  CODE STATUS: No CODE BLUE  Disposition - skilled nursing facility placement   LOS: 4 days   Pearla Dubonnet, MD 07/16/2013, 8:32 AM

## 2013-07-16 NOTE — Consult Note (Signed)
I have evaluated pt's records and agree with plan as outlined above.

## 2013-07-16 NOTE — Progress Notes (Signed)
Weekend CSW provided SNF bed offers to patient. Patient's first choice of [facility] is still pending as of 07-16-13. Patient requested that CSW contact his decision maker/caregiver [name]. CSW called [name] and Data processing manager. Weekday CSW to follow for d/c planning.  Samuella Bruin, MSW, LCSWA Clinical Social Worker [facility]. [phone]

## 2013-07-16 NOTE — Progress Notes (Signed)
Pt assessment completed, VSS, no c/o, no distress noticed. No family member at the bedside at this time. Pt is very anxious and demanding at this time, calling the nurse station every 10 min and requesting staff to be right away on his room. Pt oriented about hourly rounding, and asked how we can help him better and meets his need while in his room. Pt needs meet at this time, pt denies any further need. Bed alarm in place for fall precaution.  We'll continue with POC and hourly rounding to meet pt's needs.

## 2013-07-17 DIAGNOSIS — I739 Peripheral vascular disease, unspecified: Secondary | ICD-10-CM

## 2013-07-17 DIAGNOSIS — E860 Dehydration: Secondary | ICD-10-CM

## 2013-07-17 LAB — BASIC METABOLIC PANEL
Chloride: 94 mEq/L — ABNORMAL LOW (ref 96–112)
GFR calc non Af Amer: 34 mL/min — ABNORMAL LOW (ref >90)
Glucose, Bld: 99 mg/dL (ref 70–99)
Potassium: 4.1 mEq/L (ref 3.5–5.1)
Sodium: 135 mEq/L (ref 135–145)

## 2013-07-17 MED ORDER — ONDANSETRON HCL 4 MG PO TABS: 4.0000 mg | ORAL_TABLET | Freq: Four times a day (QID) | ORAL | Status: AC | PRN

## 2013-07-17 MED ORDER — POTASSIUM CHLORIDE CRYS ER 10 MEQ PO TBCR: 20.0000 meq | EXTENDED_RELEASE_TABLET | Freq: Every day | ORAL | Status: DC

## 2013-07-17 MED ORDER — FUROSEMIDE 40 MG PO TABS: 40.0000 mg | ORAL_TABLET | Freq: Every day | ORAL | Status: AC

## 2013-07-17 MED ORDER — ACETAMINOPHEN 325 MG PO TABS: 650.0000 mg | ORAL_TABLET | Freq: Four times a day (QID) | ORAL | Status: AC | PRN

## 2013-07-17 NOTE — Progress Notes (Signed)
PT Cancellation Note  Patient Details Name: Wesley [name] MRN: [id number] DOB: [birth year]   Cancelled Treatment:    Reason Eval/Treat Not Completed: Patient declined, no reason specified   Jaleya Pebley,KATHrine E 07/17/2013, 9:12 AM Zenovia Jarred, PT, DPT 07/17/2013 Pager: [phone]

## 2013-07-17 NOTE — Progress Notes (Signed)
Subjective: Quiet weekend. Appreciate Urology and cardiology consults. Patient is doing better and has no active complaints.   Objective: Lab: No results found for this basename: WBC, NEUTROABS, HGB, HCT, MCV, PLT,  in the last 72 hours  Recent Labs  07/15/13 0450 07/16/13 0520 07/17/13 0610  NA 136 133* 135  K 3.3* 3.5 4.1  CL 94* 92* 94*  GLUCOSE 98 106* 99  BUN 14 16 20   CREATININE 1.44* 1.32 1.66*  CALCIUM 8.1* 8.1* 8.1*    Imaging:  Scheduled Meds: . aspirin EC  81 mg Oral Daily  . feeding supplement  237 mL Oral BID BM  . furosemide  40 mg Oral BID  . potassium chloride  20 mEq Oral BID  . sodium chloride  3 mL Intravenous Q12H  . sodium chloride  3 mL Intravenous Q12H   Continuous Infusions:  PRN Meds:.acetaminophen, acetaminophen, food thickener, ondansetron (ZOFRAN) IV, ondansetron   Physical Exam: Filed Vitals:   07/17/13 0420  BP: 104/62  Pulse: 102  Temp: 98 F (36.7 C)  Resp: 20   See d/c summary      Assessment/Plan: For transfer to SNF when bed available. Problems listed and address in d/c summary  Dictated # [phone]   [facility] (o) [phone]; (c) [phone] Call-grp - [facility]  Tele: [phone]  07/17/2013, 7:32 AM

## 2013-07-17 NOTE — ED Provider Notes (Signed)
Medical screening examination/treatment/procedure(s) were performed by non-physician practitioner and as supervising physician I was immediately available for consultation/collaboration.    Vida Roller, MD 07/17/13 [phone]

## 2013-07-17 NOTE — Discharge Summary (Signed)
NAMEGREYSEN, Wesley NO.:  [id number]  MEDICAL RECORD NO.:  [id number]  LOCATION:  4E06C                        FACILITY:  [facility]  PHYSICIAN:  Rosalyn Gess. Norins, MD  DATE OF BIRTH:  [birth year]  DATE OF ADMISSION:  07/12/2013 DATE OF DISCHARGE:  07/16/2013                              DISCHARGE SUMMARY   ADMITTING DIAGNOSES: 1. Diastolic heart failure with pulmonary edema. 2. Acute renal insufficiency. 3. Weakness and failure to thrive. 4. Bladder outlet obstruction with indwelling Foley catheter. 5. History of throat cancer in remission.  DISCHARGE DIAGNOSES: 1. Diastolic heart failure with pulmonary edema. 2. Acute renal insufficiency. 3. Weakness and failure to thrive. 4. Bladder outlet obstruction with indwelling Foley catheter. 5. History of throat cancer in remission.  CONSULTANTS:  Dr. Rennis Golden for [facility], Dr. Madilyn Hook PA Urology.  PROCEDURES/IMAGING:   1.Chest x-ray, July 12, 2013, which showed atherosclerotic calcifications of thoracic aorta.  Persistent bilateral small pleural effusions with streaky bibasilar atelectasis versus infiltrate. 2. Chest x-ray, July 14, 2013 with interval near complete resolution of interstitial edema with only minimal residual interstitial fluid suspected.  HISTORY OF PRESENT ILLNESS:  Mr. Broughton had just recently been in the hospital when he presented with rhabdomyolysis after a fall and being down for 36 hours.  CK was close to 8000.  During that hospitalization, he developed acute renal failure due to bladder outlet obstruction secondary to enlarged prostate with possible prostate cancer versus BPH. The patient also developed diastolic heart failure.  The patient had generalized weakness and was seen and evaluated by both PT and OT during that admission.  Skilled care was recommended, but the patient insisted upon returning home.  The patient was discharged to home, but returned to  hospital 4 hours later because of increasing shortness of breath and weakness and was readmitted with persistent diastolic heart failure with pulmonary edema.    Please see the recent discharge summary and the H&P for past medical history, family history, social history, and physical exam.  HOSPITAL COURSE: 1. Multifactorial weakness.  The patient continues to be very weak if     not weaker.  He was re-evaluated by PT and is still in need of     skilled care.  At this point, the patient has agreed to skilled care     and he is pending discharge when bed is available. 2. Acute renal failure.  The patient has indwelling Foley catheter and     with this his creatinine has returned to normal level. 3. Bladder outlet obstruction.  The patient with a longstanding     history of bladder outlet obstruction and BPH.  He has no prior     history of prostate cancer.  He had failed to see Dr. Brunilda Payor     approximately a year ago.  He may have had an acute exacerbation of     his BPH leading to bladder outlet obstruction versus metastatic     prostate cancer with an elevated PSA of 424.  He does have an     indwelling Foley catheter and Urology recommended leaving this in     place.  They  prefer to see him as an outpatient for any definitive     treatment. 4. Diastolic heart failure.  The patient was pushed into diastolic     heart failure with his renal failure.  He has been diuresed.     During this admission, he was continued on Lasix and had a total     output of -2125 mL. With a good diuresis his x-ray cleared.  The patient     has no respiratory distress and is breathing normally.  Cardiology     had no recommendations except to keep the patient euvolemic.  With the patient being medically stable, with his diastolic heart     failure being resolved, his renal failure being resolved with     normal creatinine, he is ready for transfer to skilled care when     bed is available.  DISCHARGE  PHYSICAL EXAMINATION:  VITAL SIGNS:  Temperature was 98, blood pressure 104/62, heart rate 102, respirations 20, O2 sats 95% on room air. GENERAL APPEARANCE:  This is a very thin, very elderly African American gentleman who is weak and tired, but in no acute distress. HEENT:  Bilateral temporal wasting is noted.  Conjunctivae is muddy. The patient has arcus senilis.  Pupils are equal.  Oropharynx without lesions. NECK:  Supple.  There is no thyromegaly.  No JVD.  No carotid bruits. PULMONARY:  The patient has no increased work of breathing.  He is moving air well.  He has no rales, wheezes, or rhonchi. CARDIOVASCULAR:  Radial pulse 2+.  No JVD or carotid bruits.  He has a quiet precordium.  Heart sounds are distant, but regular. ABDOMEN:  Scaphoid with no guarding or rebound.  No tenderness. GENITALIA:  The patient has a Foley catheter in place. EXTREMITIES:  The patient is very thin with no significant deformity. DERM:  The patient has a healing abrasion on his distal left lower extremity with good granulation tissue and no need for significant dressings or wound management. NEURO:  The patient is awake, alert.  He is oriented to person, place, time, and context.  He follows commands.  Moves all his extremities as instructed.  FINAL LABORATORY:  From August 25, sodium 135, potassium 4.1, chloride of 94, CO2 of 29, BUN of 20, creatinine has bumped back up to 1.66, calcium 8.1, glucose was 99.  BNP was 822.4, down from admission when it was 3985.  Procalcitonin was 0.56 on August 21, 0.3 on August 23.  Final CBC from August 22 with a white count of 7000, hemoglobin 10.3 g, platelet count 257,000.  Thyroid function not tested this admission. PSA from July 05, 2013 was 434.1.  Urinalysis July 12, 2013 with amber urine cloudy with a specific gravity of 1.014, small amount of hemoglobin, 0-2 WBCs, 3-6 RBCs, hyaline casts were noted.  Microbiology, MRSA negative by nasal swab.  Blood  cultures were no growth at time of discharge dictation.  DISCHARGE MEDICATIONS: 1. Tylenol 650 mg q.6 p.r.n. mild pain or discomfort. 2. Amlodipine 5 mg daily. 3. Aspirin 81 mg daily. 4. Feeding supplements using Ensure or equivalent 2 times daily     between meals.   5. Food thickener with Thick-It to nectar thick     consistency based on swallow study previous admission where the     patient had mild dysphagia. 5. Lasix 40 mg daily. 6. Metoprolol 50 mg daily. 7. Ondansetron 4 mg q.6 p.r.n. nausea. 8. Potassium 20 mEq daily. 9. The patient has been  using a topical medicated ointment to his     legs.  DISPOSITION:  The patient is for transfer to skilled care facility. He may follow all facility protocols.  The patient may have leave of absence with family.  The patient will need to have laboratory followup with a basic metabolic panel on Thursday, August 28.  The patient to see Dr. Debby Bud within 7 days of discharge from facility.  FOLLOWUP:  The patient is to see Dr. Lindaann Slough for Urology in 7-14 days.  Facility will need to call to make appointment.  CODE STATUS:  The patient is DNR, DNI by his request.  The patient's condition at time of discharge dictation is guarded given his advanced age and multiple medical problems.     Rosalyn Gess Norins, MD     MEN/MEDQ  D:  07/17/2013  T:  07/17/2013  Job:  [id number]

## 2013-07-17 NOTE — Progress Notes (Signed)
DC IV, DC Tele, DC to SNF. Discharge instructions and home medications discussed with patient. Patient denied any questions or concerns at this time. Patient leaving unit via EMS with foley catheter and appears in no acute distress.

## 2013-07-18 ENCOUNTER — Non-Acute Institutional Stay (SKILLED_NURSING_FACILITY): Payer: Medicare Other | Admitting: Internal Medicine

## 2013-07-18 DIAGNOSIS — R269 Unspecified abnormalities of gait and mobility: Secondary | ICD-10-CM

## 2013-07-18 DIAGNOSIS — I509 Heart failure, unspecified: Secondary | ICD-10-CM

## 2013-07-18 DIAGNOSIS — R339 Retention of urine, unspecified: Secondary | ICD-10-CM

## 2013-07-18 DIAGNOSIS — N401 Enlarged prostate with lower urinary tract symptoms: Secondary | ICD-10-CM

## 2013-07-18 DIAGNOSIS — N179 Acute kidney failure, unspecified: Secondary | ICD-10-CM

## 2013-07-18 DIAGNOSIS — I5032 Chronic diastolic (congestive) heart failure: Secondary | ICD-10-CM

## 2013-07-18 NOTE — Progress Notes (Signed)
Patient ID: Wesley [name], male   DOB: [birth year], 79 y.o.   MRN: [id number] Facility; [facility] Chief complaint; admission to SNF post admission to  from 07/12/2013 to 07/16/2013  History; is a 79 year old man living in his own apartment with some assistance for ADLs from a cousin. He was in hospital and then very transiently discharged home and then readmitted the hospital on 07/12/2013 with increasing shortness of breath and weakness. His prior hospitalization was when he presented with rhabdomyolysis after a fall and being down for 36 hours. CK was close to 8000. During this hospitalization he developed acute renal failure due to bladder outlet obstruction secondary to enlarged prostate. He is due to followup with Dr. Brunilda Payor. During this hospitalization he was felt to have multifactorial weakness, his PSA was 424. He had diastolic heart failure and was diuresed with Lasix.   The other issue appears to be declining gait. The patient tells me he was able to walk for short distances with his cane until a month ago. He noticed that his legs were simply too weak to support himself  Past Medical History  Diagnosis Date  . Osteoarthrosis, unspecified whether generalized or localized, other specified sites   . Acute kidney failure with other specified pathological lesion in kidney   . Prostatitis   . Postsurgical percutaneous transluminal coronary angioplasty status     weintraub  . Cataract extraction status `  . Peripheral vascular disease, unspecified   . Personal history of malignant neoplasm of larynx   . Elevated prostate specific antigen (PSA)   . Gout, unspecified   . Acute peptic ulcer with hemorrhage   . Unspecified essential hypertension   . Hyperlipidemia    Past Surgical History  Procedure Laterality Date  . Percutaneous translumional coronary angioplastty      stents to bilateral legs for PVD  . Cataract extraction    . Truncal vagotomy  06/2005  . Gastrojejunosotomy   06/2005  . Cholecystectomy  06/2005   Medication amlodipine 5 mg a day, ASA 81 daily, feeding supplements with good thickener, Lasix 40 twice a day, metoprolol 50 daily, but then Centron 4 mg every 6 when necessary nausea Kay Ciel 20 mEq daily.  Socially: The patient tells me that he was living in his own apartment where he spent for 15 years. He walks with a cane. Claims to be independent with ADLs. A cousin was helping him with outside activities. He has deteriorated over the last month prior to admission when the patient states he was no longer able to walk with his walker. He is a smoker and drinks "as half a bottle" of scotch. He is divorced. Has 2 sons that he is no longer in contact with  Family history none of relevance  Genreral; no weight loss Respiratory; no cough,no SOB, hemoptysis Cardiac: No exertional chest pain, no palpitations GI: No dysphagia, no abdominal pain, No change in bowel habits GU:No dysuria,no voiding difficulties. She describes absolutely no voiding difficulties that I can elicit Musculoskeletal;No active joint pain Neurologic; gait has been declining as noted in history of present illness  General; No acute distress Vitals; p-64 RR 20 HEENT; no oral lesions Respiratory; shallow air entry in the left lower lobe. Hyperresonance to percussion. No wheezing no clubbing Cardiac; S1normal,S2normal,no murmers, no gallops,no bruits GI; no liver, no spleen,no masses,no tenderness. Rectal exam reveals guaiac-negative stool no masses. He has an enlarged somewhat irregular prostate. But no overt nodules GU; No bladder distention, no CVA tenderness. Foley  cath in place MSK; no active synovitis Skin; he has ischemic-looking wounds in the medial aspect of his left lower leg. Ischemic changes in his feet and lower legs are noted. He does not complain of pain however. May be some degree of venous stasis. Peripheral pulses were not palpable below the femoral Neurologic: CN's  2-12 normal, he is very significant proximal up a little extremity weakness had 3+ out of 5 hip abduction and flexion. He is 4/5 in the major muscle groups in his upper arm. There is very significant wasting of his quads and hamstring but no fasciculations Gait; he did not assess this Mental Status; No overt Cognitive Difficulties, No depression   ------------------------------------------------------------ LV EF: 60% -   65%  ------------------------------------------------------------ Indications:      Hypertension - resistant 401.9. Hyperlipidemia - Mixed 272.2.  Smoker 780.57.  ------------------------------------------------------------ Study Conclusions  - Procedure narrative: Transthoracic echocardiography. Image   quality was poor. The study was technically difficult, as   a result of poor acoustic windows. - Left ventricle: Systolic function was normal. The   estimated ejection fraction was in the range of 60% to   65%. Doppler parameters are consistent with abnormal left   ventricular relaxation (grade 1 diastolic dysfunction).   Doppler parameters are consistent with high ventricular   filling pressure. - Pulmonary arteries: PA peak pressure: 41mm Hg (S). Transthoracic echocardiography.  M-mode, complete 2D, spectral Doppler, and color Doppler.  Height:  Height: 180.3cm. Height: 71in.  Weight:  Weight: 75kg. Weight: 165lb.  Body mass index:  BMI: 23.1kg/m^2.  Body surface area:    BSA: 1.56m^2.  Patient status:  Inpatient. Location:  Bedside.  Impression/plan #1 recent fall with lower extremity weakness and rhabdomyolysis. He was apparently on the floor for 36 hours. Would wonder how alcohol fits in with this #2 diastolically mediated congestive heart failure. On Lasix 40 mg twice a day. His electrolytes BUN creatinine will need to be rechecked. Certainly no evidence of this at the bedside #3 acute renal failure secondary to post renal factors now with a Foley catheter  in place. He has a followup appointment with urology who will need to follow this up. #4 renal insufficiency #5 what looks to be mostly ischemic wounds on his left leg. We'll dress these with the collagen hydrogel problem #6 dysphagia #7 failure to thrive  I wonder if it is going to be possible to rehabilitate this man to an independent setting.Marland Kitchen electrolytes will be rechecked appear. We'll verify followup with urology.

## 2013-07-18 NOTE — Telephone Encounter (Signed)
Patient re-admitted to hospital on 8/20.

## 2013-07-19 LAB — CULTURE, BLOOD (ROUTINE X 2): Culture: NO GROWTH

## 2013-07-27 ENCOUNTER — Encounter: Payer: Self-pay | Admitting: Internal Medicine

## 2013-07-27 ENCOUNTER — Encounter (HOSPITAL_COMMUNITY): Payer: Self-pay | Admitting: Emergency Medicine

## 2013-07-27 ENCOUNTER — Emergency Department (HOSPITAL_COMMUNITY)
Admission: EM | Admit: 2013-07-27 | Discharge: 2013-07-28 | Disposition: A | Discharge status: Skilled Nursing Facility | Payer: Medicare Other | Attending: Emergency Medicine | Admitting: Emergency Medicine

## 2013-07-27 ENCOUNTER — Emergency Department (HOSPITAL_COMMUNITY): Payer: Medicare Other

## 2013-07-27 ENCOUNTER — Other Ambulatory Visit (INDEPENDENT_AMBULATORY_CARE_PROVIDER_SITE_OTHER): Payer: Medicare Other

## 2013-07-27 ENCOUNTER — Ambulatory Visit (INDEPENDENT_AMBULATORY_CARE_PROVIDER_SITE_OTHER): Payer: Medicare Other | Admitting: Internal Medicine

## 2013-07-27 VITALS — BP 90/52 | Temp 96.7°F

## 2013-07-27 DIAGNOSIS — Z87448 Personal history of other diseases of urinary system: Secondary | ICD-10-CM | POA: Insufficient documentation

## 2013-07-27 DIAGNOSIS — J90 Pleural effusion, not elsewhere classified: Secondary | ICD-10-CM

## 2013-07-27 DIAGNOSIS — Z862 Personal history of diseases of the blood and blood-forming organs and certain disorders involving the immune mechanism: Secondary | ICD-10-CM | POA: Insufficient documentation

## 2013-07-27 DIAGNOSIS — E871 Hypo-osmolality and hyponatremia: Secondary | ICD-10-CM

## 2013-07-27 DIAGNOSIS — J9819 Other pulmonary collapse: Secondary | ICD-10-CM | POA: Insufficient documentation

## 2013-07-27 DIAGNOSIS — R972 Elevated prostate specific antigen [PSA]: Secondary | ICD-10-CM

## 2013-07-27 DIAGNOSIS — N19 Unspecified kidney failure: Secondary | ICD-10-CM

## 2013-07-27 DIAGNOSIS — Z7982 Long term (current) use of aspirin: Secondary | ICD-10-CM | POA: Insufficient documentation

## 2013-07-27 DIAGNOSIS — R634 Abnormal weight loss: Secondary | ICD-10-CM

## 2013-07-27 DIAGNOSIS — Z8639 Personal history of other endocrine, nutritional and metabolic disease: Secondary | ICD-10-CM | POA: Insufficient documentation

## 2013-07-27 DIAGNOSIS — R0789 Other chest pain: Secondary | ICD-10-CM | POA: Insufficient documentation

## 2013-07-27 DIAGNOSIS — I10 Essential (primary) hypertension: Secondary | ICD-10-CM | POA: Insufficient documentation

## 2013-07-27 DIAGNOSIS — I509 Heart failure, unspecified: Secondary | ICD-10-CM

## 2013-07-27 DIAGNOSIS — N138 Other obstructive and reflux uropathy: Secondary | ICD-10-CM

## 2013-07-27 DIAGNOSIS — Z9849 Cataract extraction status, unspecified eye: Secondary | ICD-10-CM | POA: Insufficient documentation

## 2013-07-27 DIAGNOSIS — Z8711 Personal history of peptic ulcer disease: Secondary | ICD-10-CM | POA: Insufficient documentation

## 2013-07-27 DIAGNOSIS — N401 Enlarged prostate with lower urinary tract symptoms: Secondary | ICD-10-CM

## 2013-07-27 DIAGNOSIS — M1990 Unspecified osteoarthritis, unspecified site: Secondary | ICD-10-CM | POA: Insufficient documentation

## 2013-07-27 DIAGNOSIS — Z8521 Personal history of malignant neoplasm of larynx: Secondary | ICD-10-CM | POA: Insufficient documentation

## 2013-07-27 DIAGNOSIS — Z79899 Other long term (current) drug therapy: Secondary | ICD-10-CM | POA: Insufficient documentation

## 2013-07-27 DIAGNOSIS — Z9861 Coronary angioplasty status: Secondary | ICD-10-CM | POA: Insufficient documentation

## 2013-07-27 DIAGNOSIS — J9811 Atelectasis: Secondary | ICD-10-CM

## 2013-07-27 DIAGNOSIS — F0390 Unspecified dementia without behavioral disturbance: Secondary | ICD-10-CM | POA: Insufficient documentation

## 2013-07-27 DIAGNOSIS — F17200 Nicotine dependence, unspecified, uncomplicated: Secondary | ICD-10-CM | POA: Insufficient documentation

## 2013-07-27 LAB — CBC WITH DIFFERENTIAL/PLATELET
Basophils Absolute: 0 10*3/uL (ref 0.0–0.1)
Basophils Relative: 0 % (ref 0–1)
Eosinophils Absolute: 0 10*3/uL (ref 0.0–0.7)
Eosinophils Relative: 1 % (ref 0–5)
HCT: 30.1 % — ABNORMAL LOW (ref 39.0–52.0)
Hemoglobin: 10.7 g/dL — ABNORMAL LOW (ref 13.0–17.0)
Lymphocytes Relative: 21 % (ref 12–46)
Lymphs Abs: 1.4 K/uL (ref 0.7–4.0)
MCH: 35.2 pg — ABNORMAL HIGH (ref 26.0–34.0)
MCHC: 35.5 g/dL (ref 30.0–36.0)
MCV: 99 fL (ref 78.0–100.0)
Monocytes Absolute: 0.6 10*3/uL (ref 0.1–1.0)
Monocytes Relative: 9 % (ref 3–12)
Neutro Abs: 4.6 10*3/uL (ref 1.7–7.7)
Neutrophils Relative %: 69 % (ref 43–77)
Platelets: 495 K/uL — ABNORMAL HIGH (ref 150–400)
RBC: 3.04 MIL/uL — ABNORMAL LOW (ref 4.22–5.81)
RDW: 13.7 % (ref 11.5–15.5)
WBC: 6.7 K/uL (ref 4.0–10.5)

## 2013-07-27 LAB — POTASSIUM: Potassium: 4.6 mEq/L (ref 3.5–5.1)

## 2013-07-27 LAB — CK: Total CK: 34 U/L (ref 7–232)

## 2013-07-27 LAB — URINALYSIS, ROUTINE W REFLEX MICROSCOPIC
Bilirubin Urine: NEGATIVE
Glucose, UA: NEGATIVE mg/dL
Ketones, ur: NEGATIVE mg/dL
Nitrite: NEGATIVE
Protein, ur: NEGATIVE mg/dL
Specific Gravity, Urine: 1.015 (ref 1.005–1.030)
Urobilinogen, UA: 1 mg/dL (ref 0.0–1.0)
pH: 5.5 (ref 5.0–8.0)

## 2013-07-27 LAB — COMPREHENSIVE METABOLIC PANEL
ALT: 9 U/L (ref 0–53)
AST: 27 U/L (ref 0–37)
Alkaline Phosphatase: 77 U/L (ref 39–117)
Alkaline Phosphatase: 83 U/L (ref 39–117)
BUN: 27 mg/dL — ABNORMAL HIGH (ref 6–23)
CO2: 23 mEq/L (ref 19–32)
Chloride: 92 mEq/L — ABNORMAL LOW (ref 96–112)
Creatinine, Ser: 2.2 mg/dL — ABNORMAL HIGH (ref 0.4–1.5)
GFR calc Af Amer: 34 mL/min — ABNORMAL LOW (ref >90)
GFR calc non Af Amer: 29 mL/min — ABNORMAL LOW (ref >90)
Glucose, Bld: 105 mg/dL — ABNORMAL HIGH (ref 70–99)
Potassium: 6.7 mEq/L (ref 3.5–5.1)
Potassium: 6.5 mEq/L (ref 3.5–5.1)
Sodium: 128 mEq/L — ABNORMAL LOW (ref 135–145)
Total Bilirubin: 1.6 mg/dL — ABNORMAL HIGH (ref 0.3–1.2)

## 2013-07-27 LAB — URINE MICROSCOPIC-ADD ON

## 2013-07-27 LAB — COMPREHENSIVE METABOLIC PANEL WITH GFR
AST: 33 U/L (ref 0–37)
Albumin: 2.7 g/dL — ABNORMAL LOW (ref 3.5–5.2)
BUN: 27 mg/dL — ABNORMAL HIGH (ref 6–23)
Calcium: 9.3 mg/dL (ref 8.4–10.5)
Creatinine, Ser: 1.89 mg/dL — ABNORMAL HIGH (ref 0.50–1.35)
Total Bilirubin: 0.8 mg/dL (ref 0.3–1.2)
Total Protein: 8.9 g/dL — ABNORMAL HIGH (ref 6.0–8.3)

## 2013-07-27 LAB — TROPONIN I: Troponin I: <0.3 ng/mL (ref <0.30)

## 2013-07-27 MED ORDER — MEGESTROL ACETATE 400 MG/10ML PO SUSP: 400.0000 mg | Freq: Every day | ORAL | Status: AC

## 2013-07-27 MED ORDER — ALBUTEROL SULFATE (5 MG/ML) 0.5% IN NEBU
5.0000 mg | INHALATION_SOLUTION | Freq: Once | RESPIRATORY_TRACT | Status: AC
  Administered 2013-07-27: 5 mg via RESPIRATORY_TRACT
  Filled 2013-07-27: qty 1

## 2013-07-27 MED ORDER — SODIUM CHLORIDE 0.9 % IV BOLUS (SEPSIS)
500.0000 mL | Freq: Once | INTRAVENOUS | Status: AC
  Administered 2013-07-27: 500 mL via INTRAVENOUS

## 2013-07-27 NOTE — ED Provider Notes (Signed)
CSN: [id number]     Arrival date & time 07/27/13  2006 History   First MD Initiated Contact with Patient 07/27/13 2058     Chief Complaint  Patient presents with  . Abnormal Lab   (Consider location/radiation/quality/duration/timing/severity/associated sxs/prior Treatment) HPI Comments: Patient reports not feeling well, having weakness generalized, as well as some chest tightness over the past 2 or 3 days. Patient is currently at a skilled nursing facility where he was sent following a recent admission for rhabdomyolysis and acute renal injury. Patient denies prior history of MI or coronary disease. He denies any nausea vomiting, reports appetite is mildly down. He reportedly has a Foley catheter in place following his recent admission. He had followup earlier today with his primary care physician, Dr. Debby Bud.  Reportedly his potassium came back elevated and he was instructed to present to the emergency department for recheck and possible acute management.  The history is provided by the patient and medical records.    Past Medical History  Diagnosis Date  . Osteoarthrosis, unspecified whether generalized or localized, other specified sites   . Acute kidney failure with other specified pathological lesion in kidney   . Prostatitis   . Postsurgical percutaneous transluminal coronary angioplasty status     weintraub  . Cataract extraction status `  . Peripheral vascular disease, unspecified   . Personal history of malignant neoplasm of larynx   . Elevated prostate specific antigen (PSA)   . Gout, unspecified   . Acute peptic ulcer with hemorrhage   . Unspecified essential hypertension   . Hyperlipidemia    Past Surgical History  Procedure Laterality Date  . Percutaneous translumional coronary angioplastty      stents to bilateral legs for PVD  . Cataract extraction    . Truncal vagotomy  06/2005  . Gastrojejunosotomy  06/2005  . Cholecystectomy  06/2005   Family History  Problem  Relation Age of Onset  . Heart disease Father   . Prostate cancer Brother   . Heart disease Brother    History  Substance Use Topics  . Smoking status: Current Some Day Smoker -- 0.50 packs/day    Types: Cigarettes, Cigars  . Smokeless tobacco: Not on file  . Alcohol Use: Yes     Comment: 2-3 shots of scotch per day    Review of Systems  Unable to perform ROS: Dementia    Allergies  Review of patient's allergies indicates no known allergies.  Home Medications   Current Outpatient Rx  Name  Route  Sig  Dispense  Refill  . acetaminophen (TYLENOL) 325 MG tablet   Oral   Take 2 tablets (650 mg total) by mouth every 6 (six) hours as needed for pain.   60 tablet      . amLODipine (NORVASC) 5 MG tablet   Oral   Take 5 mg by mouth daily.         Marland Kitchen aspirin EC 81 MG tablet   Oral   Take 81 mg by mouth daily.         . furosemide (LASIX) 40 MG tablet   Oral   Take 1 tablet (40 mg total) by mouth daily.   60 tablet   5   . lactose free nutrition (BOOST) LIQD   Oral   Take 237 mLs by mouth 2 (two) times daily between meals.         . megestrol (MEGACE) 400 MG/10ML suspension   Oral   Take 10 mLs (400 mg  total) by mouth daily.   480 mL   2   . metoprolol (LOPRESSOR) 50 MG tablet   Oral   Take 50 mg by mouth daily.         . ondansetron (ZOFRAN) 4 MG tablet   Oral   Take 1 tablet (4 mg total) by mouth every 6 (six) hours as needed for nausea.   20 tablet   0   . potassium chloride (K-DUR,KLOR-CON) 10 MEQ tablet   Oral   Take 2 tablets (20 mEq total) by mouth daily.   30 tablet   5   . STARCH-MALTO DEXTRIN (THICK-IT) POWD   Oral   Take 1 scoop by mouth as needed. Use to make all liquids nectar thick.          BP 96/45  Pulse 107  Temp(Src) 98.1 F (36.7 C) (Oral)  Resp 16  SpO2 98% Physical Exam  Nursing note and vitals reviewed. Constitutional: He appears well-developed and well-nourished. No distress.  HENT:  Head: Normocephalic and  atraumatic.  Mouth/Throat: Oropharynx is clear and moist.  Eyes: EOM are normal.  Neck: Normal range of motion. Neck supple.  Cardiovascular: Normal rate and intact distal pulses.   No murmur heard. Pulmonary/Chest: Effort normal. He has rales.  Paroxysmal coughing  Abdominal: Soft. He exhibits no distension. There is no tenderness. There is no rebound.  Musculoskeletal: He exhibits no edema.  Neurological: He is alert. He exhibits normal muscle tone. Coordination normal.  Skin: Skin is warm. No rash noted. He is not diaphoretic. No pallor.  Psychiatric: He has a normal mood and affect.    ED Course  Procedures (including critical care time) Labs Review Labs Reviewed  COMPREHENSIVE METABOLIC PANEL - Abnormal; Notable for the following:    Sodium 128 (*)    Potassium 6.5 (*)    Chloride 92 (*)    Glucose, Bld 105 (*)    BUN 27 (*)    Creatinine, Ser 1.89 (*)    Total Protein 8.9 (*)    Albumin 2.7 (*)    GFR calc non Af Amer 29 (*)    GFR calc Af Amer 34 (*)    All other components within normal limits  CBC WITH DIFFERENTIAL - Abnormal; Notable for the following:    RBC 3.04 (*)    Hemoglobin 10.7 (*)    HCT 30.1 (*)    MCH 35.2 (*)    Platelets 495 (*)    All other components within normal limits  URINALYSIS, ROUTINE W REFLEX MICROSCOPIC - Abnormal; Notable for the following:    Hgb urine dipstick TRACE (*)    Leukocytes, UA MODERATE (*)    All other components within normal limits  URINE MICROSCOPIC-ADD ON - Abnormal; Notable for the following:    Casts HYALINE CASTS (*)    All other components within normal limits  URINE CULTURE  TROPONIN I  CK  POTASSIUM  CK   Imaging Review Dg Chest 2 View  07/27/2013   *RADIOLOGY REPORT*  Clinical Data: Cough and weakness  CHEST - 2 VIEW  Comparison: 07/14/2013  Findings: Normal heart size.  Emphysematous changes are present. Bilateral pleural effusions have significantly decreased since CT study from 07/14/2013, with trace  residual pleural effusions suspected. Small residual left basilar opacity.  No pulmonary edema.  Right lung is clear.  Bones osteopenic.  IMPRESSION:  1.  Bilateral pleural effusions have significantly decreased/nearly resolved since chest radiograph of 07/14/2013. 2.  Small residual left basilar  opacity could reflect airspace disease or atelectasis.   Original Report Authenticated By: Britta Mccreedy, M.D.    RA sat is 98% and I interpret to be adequate  ECG at time 2055 shows SR at rate 87, atypical LBBB, abn ECG, no sig change from ECG on 07/12/13 except no PVC's and rate is slower.     First BMP showed hemolysis as well.  K+ was 6.4.  Repeat without hemolysis shows normal K+ of 4.6.  CXR shows improvement overall.  Left base opacity, I favor as atelectasis given normal WBC and no fever . Will d/c back to facility.    MDM   1. Hyponatremia   2. Pleural effusion   3. Atelectasis      Pt reports chest tightness for 2 days, no h/o CAD.  Not exerting himself, has been coughing, slight rales or wheeze could explain the tightness.  Will try a neb treatment.  Check troponin, recheck BMP and CBC as well.  No sig ECG changes to suggest hyperkalemia.  Will check UA and CK total as well.      Gavin Pound. Oletta Lamas, MD 07/27/13 9147

## 2013-07-27 NOTE — Assessment & Plan Note (Addendum)
In-dwelling catheter remains in place. He has had a rise in creatinine since discharge.  Plan Repeat Bmet  Catheter care instructions to come from urologist office  Has appointment for September 30th @ 0900

## 2013-07-27 NOTE — ED Notes (Signed)
EKG old and new given to EDP, Ghim, MD.

## 2013-07-27 NOTE — ED Notes (Signed)
Dr Oletta Lamas notified of critical potassium

## 2013-07-27 NOTE — Assessment & Plan Note (Addendum)
No clinical signs of failure at today's visit.  PLan BNP  Addendum: BNP  85

## 2013-07-27 NOTE — ED Notes (Signed)
Lab was called about not adding the ck and tropoinin on to the blood already in the lab the task was mark completed in epic,but lab stated that she didn't receive the  label Dr Oletta Lamas aware

## 2013-07-27 NOTE — Discharge Instructions (Signed)
 Hyponatremia  Hyponatremia is when the amount of salt (sodium) in your blood is too low. When sodium levels are low, your cells will absorb extra water and swell. The swelling happens throughout the body, but it mostly affects the brain. Severe brain swelling (cerebral edema), seizures, or coma can happen.  CAUSES   Heart, kidney, or liver problems.  Thyroid problems.  Adrenal gland problems.  Severe vomiting and diarrhea.  Certain medicines or illegal drugs.  Dehydration.  Drinking too much water.  Low-sodium diet. SYMPTOMS   Nausea and vomiting.  Confusion.  Lethargy.  Agitation.  Headache.  Twitching or shaking (seizures).  Unconsciousness.  Appetite loss.  Muscle weakness and cramping. DIAGNOSIS  Hyponatremia is identified by a simple blood test. Your caregiver will perform a history and physical exam to try to find the cause and type of hyponatremia. Other tests may be needed to measure the amount of sodium in your blood and urine. TREATMENT  Treatment will depend on the cause.   Fluids may be given through the vein (IV).  Medicines may be used to correct the sodium imbalance. If medicines are causing the problem, they will need to be adjusted.  Water or fluid intake may be restricted to restore proper balance. The speed of correcting the sodium problem is very important. If the problem is corrected too fast, nerve damage (sometimes unchangeable) can happen. HOME CARE INSTRUCTIONS   Only take medicines as directed by your caregiver. Many medicines can make hyponatremia worse. Discuss all your medicines with your caregiver.  Carefully follow any recommended diet, including any fluid restrictions.  You may be asked to repeat lab tests. Follow these directions.  Avoid alcohol and recreational drugs. SEEK MEDICAL CARE IF:   You develop worsening nausea, fatigue, headache, confusion, or weakness.  Your original hyponatremia symptoms return.  You have  problems following the recommended diet. SEEK IMMEDIATE MEDICAL CARE IF:   You have a seizure.  You faint.  You have ongoing diarrhea or vomiting. MAKE SURE YOU:   Understand these instructions.  Will watch your condition.  Will get help right away if you are not doing well or get worse. Document Released: 10/30/2002 Document Revised: 02/01/2012 Document Reviewed: 04/26/2011 [facility] Patient Information 2014 [place], [place].    Pleural Effusion The lining covering your lungs and the inside of your chest is called the pleura. Usually, the space between the 2 pleura contains no air and only a thin layer of fluid. A pleural effusion is an abnormal buildup of fluid in the pleural space. Fluid gathers when there is increased pressure in the lung vessels. This forces fluids out of the lungs and into the pleural space. Vessels may also leak fluids when there are infections, such as pneumonia, or other causes of soreness and redness (inflammation). Fluids leak into the lungs when protein in the blood is low or when certain vessels (lymphatics) are blocked. Finding a pleural effusion is important because it is usually caused by another disease. In order to treat a pleural effusion, your caregiver needs to find its cause. If left untreated, a large amount of fluid can build up and cause collapse of the lung. CAUSES   Heart failure.  Infections (pneumonia, tuberculosis), pulmonary embolism, pulmonary infarction.  Cancer (primary lung and metastatic), asbestosis.  Liver failure (cirrhosis).  Nephrotic syndrome, peritoneal dialysis, kidney problems (uremia).  Collagen vascular disease (systemic lupus erythematosis, rheumatoid arthritis).  Injury (trauma) to the chest or rupture of the digestive tube (esophagus).  Material in the  chest or pleural space (hemothorax, chylothorax).  Pancreatitis.  Surgery.  Drug reactions. SYMPTOMS  A pleural effusion can decrease the amount of space  available for breathing and make you short of breath. The fluid can become infected, which may cause pain and fever. Often, the pain is worse when taking a deep breath. The underlying disease (heart failure, pneumonia, blood clot, tuberculosis, cancer) may also cause symptoms. DIAGNOSIS   Your caregiver can usually tell what is wrong by talking to you (taking a history), doing an exam, and taking a routine X-ray. If the X-ray shows fluid in your chest, often fluid is removed from your chest with a needle for testing (diagnostic thoracentesis).  Sometimes, more specialized X-rays may be needed.  Sometimes, a small piece of tissue is removed and examined by a specialist (biopsy). TREATMENT  Treatment varies based on what caused the pleural effusion. Treatments include:  Removing as much fluid as possible using a needle (thoracentesis) to improve the cough and shortness of breath. This is a simple procedure which can be done at bedside. The risks are bleeding, infection, collapse of a lung, or low blood pressure.  Placing a tube in the chest to drain the effusion (tube thoracostomy). This is often used when there is an infection in the fluid. This is a simple procedure which can often be done at bedside or in a clinic. The procedure may be painful. The risks are the same as using a needle to drain the fluid. The chest tube usually remains for a few days and is connected to suction to improve fluid drainage. The tube, after placement, usually does not cause much discomfort.  Surgical removal of fibrous debris in and around the pleural space (decortication). This may be done with a flexible telescope (thoracoscope) through a small or large cut (incision). This is helpful for patients who have fibrosis or scar tissue that prevents complete lung expansion. The risks are infection, blood loss, and side effects from general anesthesia.  Sometimes, a procedure called pleurodesis is done. A chest tube is  placed and the fluid is drained. Next, an agent (tetracycline, talc powder) is added to the pleural space. This causes the lung and chest wall to stick together (adhesion). This leaves no potential space for fluid to build up. The risks include infection, blood loss, and side effects from general anesthesia.  If the effusion is caused by infection, it may be treated with antibiotics and improve without draining. HOME CARE INSTRUCTIONS   Take any medicines exactly as prescribed.  Follow up with your caregiver as directed.  Monitor your exercise capacity (the amount of walking you can do before you get short of breath).  Do not smoke. Ask your caregiver for help quitting. SEEK MEDICAL CARE IF:   Your exercise capacity seems to get worse or does not improve with time.  You do not recover from your illness. SEEK IMMEDIATE MEDICAL CARE IF:   Shortness of breath or chest pain develops or gets worse.  You have an oral temperature above 102 F (38.9 C), not controlled by medicine.  You develop a new cough, especially if the mucus (phlegm) is discolored. MAKE SURE YOU:   Understand these instructions.  Will watch your condition.  Will get help right away if you are not doing well or get worse. Document Released: 11/09/2005 Document Revised: 02/01/2012 Document Reviewed: 07/01/2007 [facility] Patient Information 2014 [place], [place].     K+ was rechecked and it was normal at 4.6.  Please follow  up with Dr. Harlow next week for a recheck regarding overall weakness.

## 2013-07-27 NOTE — Progress Notes (Signed)
Subjective:    Patient ID: Wesley [name], male    DOB: 08/23/1920, 79 y.o.   MRN: [id number]  HPI Mr. Petroni presents for hospital follow up. He was hospitalized for rhabdomyolysis and acute obstructive nephropathy and profound weakness. Hospital D/C summary reviewed. He was d/c to SNF with foley catheter. He is not really feeling any stronger. He is still having SOB. He did have lab work with Cr 1.8, Na, K normal, CK 44. There have been no changes in his condition otherwise. At discharge his Cr was 1.6.  Past Medical History  Diagnosis Date  . Osteoarthrosis, unspecified whether generalized or localized, other specified sites   . Acute kidney failure with other specified pathological lesion in kidney   . Prostatitis   . Postsurgical percutaneous transluminal coronary angioplasty status     weintraub  . Cataract extraction status `  . Peripheral vascular disease, unspecified   . Personal history of malignant neoplasm of larynx   . Elevated prostate specific antigen (PSA)   . Gout, unspecified   . Acute peptic ulcer with hemorrhage   . Unspecified essential hypertension   . Hyperlipidemia    Past Surgical History  Procedure Laterality Date  . Percutaneous translumional coronary angioplastty      stents to bilateral legs for PVD  . Cataract extraction    . Truncal vagotomy  06/2005  . Gastrojejunosotomy  06/2005  . Cholecystectomy  06/2005   Family History  Problem Relation Age of Onset  . Heart disease Father   . Prostate cancer Brother   . Heart disease Brother    History   Social History  . Marital Status: Divorced    Spouse Name: N/A    Number of Children: N/A  . Years of Education: N/A   Occupational History  . Not on file.   Social History Main Topics  . Smoking status: Current Some Day Smoker -- 0.50 packs/day    Types: Cigarettes, Cigars  . Smokeless tobacco: Not on file  . Alcohol Use: Yes     Comment: 2-3 shots of scotch per day  . Drug Use: No  . Sexual  Activity: No   Other Topics Concern  . 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   End-of-life: wants CPR but no prolonged heroic measures.    Current Outpatient Prescriptions on File Prior to Visit  Medication Sig Dispense Refill  . acetaminophen (TYLENOL) 325 MG tablet Take 2 tablets (650 mg total) by mouth every 6 (six) hours as needed for pain.  60 tablet    . amLODipine (NORVASC) 5 MG tablet Take 5 mg by mouth daily.      Marland Kitchen aspirin EC 81 MG tablet Take 81 mg by mouth daily.      . feeding supplement (ENSURE COMPLETE) LIQD Take 237 mL by mouth 2 (two) times daily between meals.      . food thickener (THICK IT) POWD Use to make all liquids nectar thick consistancy  850 g  5  . furosemide (LASIX) 40 MG tablet Take 1 tablet (40 mg total) by mouth daily.  60 tablet  5  . metoprolol (LOPRESSOR) 50 MG tablet Take 50 mg by mouth daily.      . ondansetron (ZOFRAN) 4 MG tablet Take 1  tablet (4 mg total) by mouth every 6 (six) hours as needed for nausea.  20 tablet  0  . potassium chloride (K-DUR,KLOR-CON) 10 MEQ tablet Take 2 tablets (20 mEq total) by mouth daily.  30 tablet  5  . PRESCRIPTION MEDICATION Apply 1 application topically daily. Medicated cream for sores on legs       No current facility-administered medications on file prior to visit.        Review of Systems  Constitutional: Positive for fever and fatigue. Negative for activity change and appetite change.  HENT: Negative.   Eyes: Negative.   Respiratory: Positive for chest tightness and shortness of breath.   Cardiovascular: Positive for chest pain.  Gastrointestinal: Negative.   Genitourinary: Positive for difficulty urinating. Negative for decreased urine volume.  Musculoskeletal: Negative.   Skin: Negative.   Hematological: Negative.    Psychiatric/Behavioral: Negative.         Objective:   Physical Exam Filed Vitals:   07/27/13 1434  BP: 90/52  Temp: 96.7 F (35.9 C)   Wt Readings from Last 3 Encounters:  07/17/13 121 lb 4.1 oz (55 kg)  07/12/13 140 lb 10.5 oz (63.8 kg)  11/22/12 141 lb 0.6 oz (63.975 kg)   Gen'l - very skinny AA man in no distress HEENT- Muddy sclera, arcus senilis Cor- 2+ radial RRR Pulm - shallow inspirations, no rales or wheezes Ext w/o edema       Assessment & Plan:

## 2013-07-27 NOTE — ED Notes (Signed)
Pt here from [place] with K level 6.7 pt here for further evaluation

## 2013-07-27 NOTE — Assessment & Plan Note (Signed)
BP Readings from Last 3 Encounters:  07/27/13 90/52  07/17/13 109/64  07/12/13 112/57   Running low but will continue present medicaitons

## 2013-07-27 NOTE — Assessment & Plan Note (Signed)
Poor appetite and continued weight loss  Plan - megace 400 mg po daily  Diet of choice.

## 2013-07-27 NOTE — ED Notes (Signed)
Bed: WA06 Expected date: 07/27/13 Expected time: 7:58 PM Means of arrival: Ambulance Comments: High potassium

## 2013-07-28 NOTE — ED Notes (Signed)
[facility] called for transportation to [facility]

## 2013-07-29 LAB — URINE CULTURE
Colony Count: NO GROWTH
Culture: NO GROWTH

## 2013-07-30 NOTE — Assessment & Plan Note (Signed)
Continued elevation of Creatinine:since discharge Cr 1.66 to most recent creatinine 1.89 (9/4). Proximal cause was BPH with ureteral obstruction. He has indwelling Foley catheter. In-patient GU consultation recommended keeping catheter until GU follow up, currently scheduled for September 30th with Dr. Arnoldo Morale. At today's exam no indication of infection.  Plan Keep GU appt  Contacted [facility] - requested assistance with chronic catheter care and follow-up: staff was to leave any care instructions with nursing at [facility]  F/u Bmet   Addendum Bmet with increased creatinine, K 6.7. Patient referred to ED - repeat K 6.4 hemolyzed, 4.6 non hemolyzed and patient discharge to facility.

## 2013-07-31 ENCOUNTER — Encounter: Payer: Self-pay | Admitting: Internal Medicine

## 2013-07-31 ENCOUNTER — Ambulatory Visit (INDEPENDENT_AMBULATORY_CARE_PROVIDER_SITE_OTHER): Payer: Medicare Other | Admitting: Internal Medicine

## 2013-07-31 VITALS — BP 100/72 | Temp 96.6°F

## 2013-07-31 DIAGNOSIS — E875 Hyperkalemia: Secondary | ICD-10-CM

## 2013-07-31 NOTE — Progress Notes (Signed)
  Subjective:    Patient ID: Wesley [name], male    DOB: 07/02/1920, 79 y.o.   MRN: [id number]  HPI Mr. Advani was seen last Thursday, Sept 4th. Lab that day reveal K = 6.7!!! He was sent to ED: initial K = 6.4, follow up (non-hemolyzed specimen) 4.6. EKG was normal. He was returned to SNF. In the interval he has been fine.  PMH, FamHx and SocHx reviewed for any changes and relevance.  Current Outpatient Prescriptions on File Prior to Visit  Medication Sig Dispense Refill  . acetaminophen (TYLENOL) 325 MG tablet Take 2 tablets (650 mg total) by mouth every 6 (six) hours as needed for pain.  60 tablet    . amLODipine (NORVASC) 5 MG tablet Take 5 mg by mouth daily.      Marland Kitchen aspirin EC 81 MG tablet Take 81 mg by mouth daily.      . furosemide (LASIX) 40 MG tablet Take 1 tablet (40 mg total) by mouth daily.  60 tablet  5  . lactose free nutrition (BOOST) LIQD Take 237 mLs by mouth 2 (two) times daily between meals.      . megestrol (MEGACE) 400 MG/10ML suspension Take 10 mLs (400 mg total) by mouth daily.  480 mL  2  . metoprolol (LOPRESSOR) 50 MG tablet Take 50 mg by mouth daily.      . ondansetron (ZOFRAN) 4 MG tablet Take 1 tablet (4 mg total) by mouth every 6 (six) hours as needed for nausea.  20 tablet  0  . potassium chloride (K-DUR,KLOR-CON) 10 MEQ tablet Take 2 tablets (20 mEq total) by mouth daily.  30 tablet  5  . STARCH-MALTO DEXTRIN (THICK-IT) POWD Take 1 scoop by mouth as needed. Use to make all liquids nectar thick.       No current facility-administered medications on file prior to visit.      Review of Systems System review is negative for any constitutional, cardiac, pulmonary, GI or neuro symptoms or complaints other than as described in the HPI.     Objective:   Physical Exam Filed Vitals:   07/31/13 1123  BP: 100/72  Temp: 96.6 F (35.9 C)   Wt Readings from Last 3 Encounters:  07/17/13 121 lb 4.1 oz (55 kg)  07/12/13 140 lb 10.5 oz (63.8 kg)  11/22/12 141 lb 0.6 oz  (63.975 kg)   Gen'l - very thin AA man in no distress HEENT- muddy Sclera, arcus senilis Cor- RRR Pulm - normal       Assessment & Plan:  1. Hyperkalemia - resolved.

## 2013-08-06 ENCOUNTER — Encounter (HOSPITAL_COMMUNITY): Payer: Self-pay | Admitting: *Deleted

## 2013-08-06 ENCOUNTER — Emergency Department (HOSPITAL_COMMUNITY)
Admission: EM | Admit: 2013-08-06 | Discharge: 2013-08-06 | Disposition: A | Discharge status: Home / Self Care | Payer: Medicare Other | Attending: Emergency Medicine | Admitting: Emergency Medicine

## 2013-08-06 DIAGNOSIS — I5030 Unspecified diastolic (congestive) heart failure: Secondary | ICD-10-CM | POA: Insufficient documentation

## 2013-08-06 DIAGNOSIS — Z79899 Other long term (current) drug therapy: Secondary | ICD-10-CM | POA: Insufficient documentation

## 2013-08-06 DIAGNOSIS — Z7982 Long term (current) use of aspirin: Secondary | ICD-10-CM | POA: Insufficient documentation

## 2013-08-06 DIAGNOSIS — R339 Retention of urine, unspecified: Secondary | ICD-10-CM | POA: Insufficient documentation

## 2013-08-06 DIAGNOSIS — Z862 Personal history of diseases of the blood and blood-forming organs and certain disorders involving the immune mechanism: Secondary | ICD-10-CM | POA: Insufficient documentation

## 2013-08-06 DIAGNOSIS — F17200 Nicotine dependence, unspecified, uncomplicated: Secondary | ICD-10-CM | POA: Insufficient documentation

## 2013-08-06 DIAGNOSIS — M1990 Unspecified osteoarthritis, unspecified site: Secondary | ICD-10-CM | POA: Insufficient documentation

## 2013-08-06 DIAGNOSIS — Z8711 Personal history of peptic ulcer disease: Secondary | ICD-10-CM | POA: Insufficient documentation

## 2013-08-06 DIAGNOSIS — Z9861 Coronary angioplasty status: Secondary | ICD-10-CM | POA: Insufficient documentation

## 2013-08-06 DIAGNOSIS — Z87448 Personal history of other diseases of urinary system: Secondary | ICD-10-CM | POA: Insufficient documentation

## 2013-08-06 DIAGNOSIS — Z8521 Personal history of malignant neoplasm of larynx: Secondary | ICD-10-CM | POA: Insufficient documentation

## 2013-08-06 DIAGNOSIS — Z8639 Personal history of other endocrine, nutritional and metabolic disease: Secondary | ICD-10-CM | POA: Insufficient documentation

## 2013-08-06 DIAGNOSIS — I10 Essential (primary) hypertension: Secondary | ICD-10-CM | POA: Insufficient documentation

## 2013-08-06 DIAGNOSIS — Z8679 Personal history of other diseases of the circulatory system: Secondary | ICD-10-CM | POA: Insufficient documentation

## 2013-08-06 HISTORY — DX: Malignant (primary) neoplasm, unspecified: C80.1

## 2013-08-06 HISTORY — DX: Adult failure to thrive: R62.7

## 2013-08-06 HISTORY — DX: Unspecified diastolic (congestive) heart failure: I50.30

## 2013-08-06 HISTORY — DX: Chronic pulmonary edema: J81.1

## 2013-08-06 LAB — URINALYSIS, ROUTINE W REFLEX MICROSCOPIC
Ketones, ur: NEGATIVE mg/dL
Nitrite: NEGATIVE
Protein, ur: NEGATIVE mg/dL
Urobilinogen, UA: 0.2 mg/dL (ref 0.0–1.0)

## 2013-08-06 MED ORDER — ACETAMINOPHEN 325 MG PO TABS
650.0000 mg | ORAL_TABLET | Freq: Once | ORAL | Status: AC
  Administered 2013-08-06: 650 mg via ORAL
  Filled 2013-08-06: qty 2

## 2013-08-06 NOTE — ED Notes (Signed)
Bed: RU04 Expected date: 08/06/13 Expected time: 12:50 AM Means of arrival: Ambulance Comments: Bed 19,EMS, 78 M, Urinary Catheter Problem

## 2013-08-06 NOTE — ED Notes (Signed)
Per EMS pt with Hematuria today. History of urinary retention, bladder neck obstruction Has chronic foley, but staff Unable  To place foley today at SNF. Had a lot of blood in urine and depend.  Adult FTT, anorexia. Pt has history of low SBP, normally in the 80's. Per EMS SBP manually was 80/60 to 98/60 HR 92, RR: 18. Fully alert and oriented x 4. Has dysphasia. Typically pt known to have low urine output.

## 2013-08-06 NOTE — ED Provider Notes (Signed)
CSN: [id number]     Arrival date & time 08/06/13  0104 History   First MD Initiated Contact with Patient 08/06/13 0112     Chief Complaint  Patient presents with  . Urinary Retention   (Consider location/radiation/quality/duration/timing/severity/associated sxs/prior Treatment) HPI This is a 79 year old male with a history of bladder outlet obstruction and chronic indwelling Foley. For reasons unknown to him his Foley catheter was removed at his skilled nursing facility this morning. They were unable to replace the Foley and he was sent here for further evaluation. Associated with this was bleeding from the urethra and passage of clots. The bleeding has subsequently abated. He states his bladder feels full and he feels urgency to urinate especially when his bladder is pressed.  Past Medical History  Diagnosis Date  . Osteoarthrosis, unspecified whether generalized or localized, other specified sites   . Acute kidney failure with other specified pathological lesion in kidney   . Prostatitis   . Postsurgical percutaneous transluminal coronary angioplasty status     weintraub  . Cataract extraction status `  . Peripheral vascular disease, unspecified   . Personal history of malignant neoplasm of larynx   . Elevated prostate specific antigen (PSA)   . Gout, unspecified   . Acute peptic ulcer with hemorrhage   . Unspecified essential hypertension   . Hyperlipidemia   . Diastolic heart failure   . Pulmonary edema   . FTT (failure to thrive) in adult   . Cancer     throat   Past Surgical History  Procedure Laterality Date  . Percutaneous translumional coronary angioplastty      stents to bilateral legs for PVD  . Cataract extraction    . Truncal vagotomy  06/2005  . Gastrojejunosotomy  06/2005  . Cholecystectomy  06/2005   Family History  Problem Relation Age of Onset  . Heart disease Father   . Prostate cancer Brother   . Heart disease Brother    History  Substance Use  Topics  . Smoking status: Current Some Day Smoker -- 0.50 packs/day    Types: Cigarettes, Cigars  . Smokeless tobacco: Not on file  . Alcohol Use: Yes     Comment: 2-3 shots of scotch per day    Review of Systems  All other systems reviewed and are negative.    Allergies  Review of patient's allergies indicates no known allergies.  Home Medications   Current Outpatient Rx  Name  Route  Sig  Dispense  Refill  . amLODipine (NORVASC) 5 MG tablet   Oral   Take 5 mg by mouth daily.         Marland Kitchen aspirin EC 81 MG tablet   Oral   Take 81 mg by mouth daily.         . furosemide (LASIX) 40 MG tablet   Oral   Take 1 tablet (40 mg total) by mouth daily.   60 tablet   5   . lactose free nutrition (BOOST) LIQD   Oral   Take 237 mLs by mouth 2 (two) times daily between meals.         . megestrol (MEGACE) 400 MG/10ML suspension   Oral   Take 10 mLs (400 mg total) by mouth daily.   480 mL   2   . metoprolol (LOPRESSOR) 50 MG tablet   Oral   Take 50 mg by mouth daily.         . ondansetron (ZOFRAN) 4 MG tablet  Oral   Take 1 tablet (4 mg total) by mouth every 6 (six) hours as needed for nausea.   20 tablet   0   . potassium chloride (K-DUR,KLOR-CON) 10 MEQ tablet   Oral   Take 2 tablets (20 mEq total) by mouth daily.   30 tablet   5   . STARCH-MALTO DEXTRIN (THICK-IT) POWD   Oral   Take 1 scoop by mouth as needed. Use to make all liquids nectar thick.         Marland Kitchen acetaminophen (TYLENOL) 325 MG tablet   Oral   Take 2 tablets (650 mg total) by mouth every 6 (six) hours as needed for pain.   60 tablet       BP 102/63  Pulse 90  Temp(Src) 97.8 F (36.6 C) (Oral)  Resp 20  SpO2 95%  Physical Exam General: Well-developed, cachectic male in no acute distress; appearance consistent with age of record HENT: normocephalic; atraumatic; dentures Eyes: pupils equal, round and reactive to light; extraocular muscles intact; arcus senilis bilaterally Neck:  supple Heart: regular rate and rhythm Lungs: Normal respiratory effort and excursion Abdomen: soft; mildly distended bladder; pressure on bladder reproduces urinary urgency GU: Tanner 4 male, circumcised; a small amount of clotted blood at the urethral meatus Extremities: No deformity; chronic-appearing stasis changes of lower legs Neurologic: Awake, alert and oriented; motor function intact in all extremities and symmetric; no facial droop Skin: Warm and dry    ED Course  Procedures (including critical care time)  FOLEY CATHETER The patient was prepped and draped in the usual sterile manner. A 20 French coud catheter was lubricated and placed in the urethra until urine was obtained. The urine was initially blood-tinged but cleared quickly. The patient tolerated this well and there were no immediate complications.  MDM   Nursing notes and vitals signs, including pulse oximetry, reviewed.  Summary of this visit's results, reviewed by myself:  Labs:  Results for orders placed during the hospital encounter of 08/06/13 (from the past 24 hour(s))  URINALYSIS, ROUTINE W REFLEX MICROSCOPIC     Status: Abnormal   Collection Time    08/06/13  2:19 AM      Result Value Range   Color, Urine AMBER (*) YELLOW   APPearance CLOUDY (*) CLEAR   Specific Gravity, Urine 1.021  1.005 - 1.030   pH 5.0  5.0 - 8.0   Glucose, UA NEGATIVE  NEGATIVE mg/dL   Hgb urine dipstick LARGE (*) NEGATIVE   Bilirubin Urine NEGATIVE  NEGATIVE   Ketones, ur NEGATIVE  NEGATIVE mg/dL   Protein, ur NEGATIVE  NEGATIVE mg/dL   Urobilinogen, UA 0.2  0.0 - 1.0 mg/dL   Nitrite NEGATIVE  NEGATIVE   Leukocytes, UA SMALL (*) NEGATIVE  URINE MICROSCOPIC-ADD ON     Status: Abnormal   Collection Time    08/06/13  2:19 AM      Result Value Range   WBC, UA 3-6  <3 WBC/hpf   RBC / HPF TOO NUMEROUS TO COUNT  <3 RBC/hpf   Bacteria, UA FEW (*) RARE        Hanley Seamen, MD 08/06/13 [phone]

## 2013-08-07 LAB — URINE CULTURE

## 2013-08-08 ENCOUNTER — Other Ambulatory Visit (HOSPITAL_COMMUNITY): Payer: Self-pay | Admitting: Internal Medicine

## 2013-08-08 DIAGNOSIS — R1313 Dysphagia, pharyngeal phase: Secondary | ICD-10-CM

## 2013-08-14 ENCOUNTER — Ambulatory Visit (HOSPITAL_COMMUNITY)
Admission: RE | Admit: 2013-08-14 | Discharge: 2013-08-14 | Disposition: A | Discharge status: Home / Self Care | Payer: Medicare Other | Source: Ambulatory Visit | Attending: Internal Medicine | Admitting: Internal Medicine

## 2013-08-14 DIAGNOSIS — R1319 Other dysphagia: Secondary | ICD-10-CM | POA: Insufficient documentation

## 2013-08-14 DIAGNOSIS — R1313 Dysphagia, pharyngeal phase: Secondary | ICD-10-CM

## 2013-08-14 NOTE — Procedures (Signed)
Objective Swallowing Evaluation: Modified Barium Swallowing Study  Patient Details  Name: Wesley [name] MRN: [id number] Date of Birth: May 21, 1920  Today's Date: 08/14/2013 Time: 1150-1220 SLP Time Calculation (min): 30 min  Past Medical History:  Past Medical History  Diagnosis Date  . Osteoarthrosis, unspecified whether generalized or localized, other specified sites   . Acute kidney failure with other specified pathological lesion in kidney   . Prostatitis   . Postsurgical percutaneous transluminal coronary angioplasty status     weintraub  . Cataract extraction status `  . Peripheral vascular disease, unspecified   . Personal history of malignant neoplasm of larynx   . Elevated prostate specific antigen (PSA)   . Gout, unspecified   . Acute peptic ulcer with hemorrhage   . Unspecified essential hypertension   . Hyperlipidemia   . Diastolic heart failure   . Pulmonary edema   . FTT (failure to thrive) in adult   . Cancer     throat   Past Surgical History:  Past Surgical History  Procedure Laterality Date  . Percutaneous translumional coronary angioplastty      stents to bilateral legs for PVD  . Cataract extraction    . Truncal vagotomy  06/2005  . Gastrojejunosotomy  06/2005  . Cholecystectomy  06/2005   HPI:  Pt is a 79 year old SNF resident who has been referred for an outpatient MBS. He is on Dys 1 diet with nectar thick liquids. SLP reports is noted to cough dring or after meals and has episodes of wet vocal quality. Pt had an MBS approx 1 month ago with finding of primary cervical esopahgeal dn esophageal dysphagia with silent aspiration of thin liquids. No documentation of any f/u with GI or ENT. Pt has also suffered from dehydration and unintentional weight loss. Unsure if pt has dementia, but to this SLP her reports he lives alone, not at a SNF and that he has no trouble swallowing.      Assessment / Plan / Recommendation Clinical Impression  Dysphagia Diagnosis:  Suspected primary esophageal dysphagia;Moderate cervical esophageal phase dysphagia;Moderate pharyngeal phase dysphagia;Mild oral phase dysphagia Clinical impression: Pt presents with modest improvement in function since last MBS that indicated severe cervical esophageal and suspected esophageal dysphagia. Though these problems persist, oral and oropharyngeal function improved and likely compensating for deficits.   Pt appears to have a mild cognitive based oral dysphagia with pumping and delayed transit of solid texture. He refused puree and cracker, but was able to very slowly process soft fruit bar. Pharyngeal phase characterized by initally good airway protection but then with moderate to severe pyriform sinus residuals penetrating airway post swallow when at their maximum. Residue due initally to reduced opening of prominent CP segment, but then increasing from backflow from severe esophageal stasis, particularly with thin liquids. This liquids were silently aspirated with repeated surging of liquids to pharynx. Nectar thick liquids seemed to transit more rapidly, possibly due to weight, though suspect with a full meal with nectar thick liquids are also aspirated post swallow.   Recommendations for liquids deferred to treating SLP. Pt may have equal risk of aspiration with thin or nectar thick liquids, though QOL may be signficantly improved with upgrade. Concerned that upgrade of solid textures would significantly worsen esophageal transit, therefore continue to recommend puree. Pt pills should be crushed as they lodge at GE junction. Question if referral to GI would be helpful.     Treatment Recommendation  Defer treatment plan to SLP at (Comment) (SNF)  Diet Recommendation Dysphagia 1 (Puree);Nectar-thick liquid;Thin liquid (liquid texture deferred to treating SLP)   Liquid Administration via: Cup;No straw Medication Administration: Crushed with puree Supervision: Patient able to self  feed Compensations: Slow rate;Small sips/bites;Follow solids with liquid;Multiple dry swallows after each bite/sip Postural Changes and/or Swallow Maneuvers: Seated upright 90 degrees;Upright 30-60 min after meal    Other  Recommendations Recommended Consults: Consider GI evaluation;Consider esophageal assessment Oral Care Recommendations: Oral care BID   Follow Up Recommendations  Skilled Nursing facility    Frequency and Duration        Pertinent Vitals/Pain NA    SLP Swallow Goals     General HPI: Pt is a 79 year old SNF resident who has been referred for an outpatient MBS. He is on Dys 1 diet with nectar thick liquids. SLP reports is noted to cough dring or after meals and has episodes of wet vocal quality. Pt had an MBS approx 1 month ago with finding of primary cervical esopahgeal dn esophageal dysphagia with silent aspiration of thin liquids. No documentation of any f/u with GI or ENT. Pt has also suffered from dehydration and unintentional weight loss. Unsure if pt has dementia, but to this SLP her reports he lives alone, not at a SNF and that he has no trouble swallowing.  Type of Study: Modified Barium Swallowing Study Reason for Referral: Objectively evaluate swallowing function Diet Prior to this Study: Dysphagia 1 (puree);Nectar-thick liquids Temperature Spikes Noted: N/A Respiratory Status: Room air Behavior/Cognition: Alert;Confused Oral Cavity - Dentition: Dentures, top (bottom denture not in place) Oral Motor / Sensory Function: Within functional limits Self-Feeding Abilities: Able to feed self Patient Positioning: Upright in chair Baseline Vocal Quality: Clear Volitional Cough: Strong Volitional Swallow: Able to elicit Anatomy: Other (Comment) (Curvature of cervical spine, appearance of bony protrusion) Pharyngeal Secretions: Not observed secondary MBS    Reason for Referral Objectively evaluate swallowing function   Oral Phase Oral Preparation/Oral Phase Oral  Phase: Impaired Oral - Nectar Oral - Nectar Cup: Within functional limits Oral - Thin Oral - Thin Cup: Within functional limits Oral - Thin Straw: Within functional limits Oral - Solids Oral - Puree: Lingual pumping;Reduced posterior propulsion;Delayed oral transit Oral - Mechanical Soft: Lingual pumping;Reduced posterior propulsion;Delayed oral transit   Pharyngeal Phase Pharyngeal Phase Pharyngeal Phase: Impaired Pharyngeal - Nectar Pharyngeal - Nectar Cup: Reduced pharyngeal peristalsis;Penetration/Aspiration after swallow;Pharyngeal residue - cp segment;Pharyngeal residue - pyriform sinuses Penetration/Aspiration details (nectar cup): Material enters airway, remains ABOVE vocal cords and not ejected out;Material does not enter airway Pharyngeal - Thin Pharyngeal - Thin Cup: Reduced pharyngeal peristalsis;Penetration/Aspiration after swallow;Pharyngeal residue - cp segment;Pharyngeal residue - pyriform sinuses Penetration/Aspiration details (thin cup): Material does not enter airway Pharyngeal - Thin Straw: Reduced pharyngeal peristalsis;Penetration/Aspiration after swallow;Pharyngeal residue - cp segment;Pharyngeal residue - pyriform sinuses Penetration/Aspiration details (thin straw): Material enters airway, remains ABOVE vocal cords and not ejected out;Material enters airway, passes BELOW cords without attempt by patient to eject out (silent aspiration);Material enters airway, CONTACTS cords and not ejected out Pharyngeal - Solids Pharyngeal - Puree: Not tested (Pt spit out puree, said it was nasty) Pharyngeal - Mechanical Soft: Pharyngeal residue - cp segment;Pharyngeal residue - pyriform sinuses Pharyngeal - Pill: Pharyngeal residue - cp segment;Pharyngeal residue - pyriform sinuses  Cervical Esophageal Phase    GO    Cervical Esophageal Phase Cervical Esophageal Phase: Impaired Cervical Esophageal Phase - Comment Cervical Esophageal Comment: see imporession  statement    Functional Assessment Tool Used: clinical judgement Functional Limitations: Swallowing  Swallow Current Status [phone]): At least 40 percent but less than 60 percent impaired, limited or restricted Swallow Goal Status [phone]): At least 40 percent but less than 60 percent impaired, limited or restricted Swallow Discharge Status [phone]): At least 40 percent but less than 60 percent impaired, limited or restricted   [facility] [place], [place] CCC-SLP [phone]  Claudine Mouton 08/14/2013, 1:48 PM

## 2013-08-29 ENCOUNTER — Non-Acute Institutional Stay (SKILLED_NURSING_FACILITY): Payer: Medicare Other | Admitting: Internal Medicine

## 2013-08-29 DIAGNOSIS — J4489 Other specified chronic obstructive pulmonary disease: Secondary | ICD-10-CM

## 2013-08-29 DIAGNOSIS — J449 Chronic obstructive pulmonary disease, unspecified: Secondary | ICD-10-CM

## 2013-08-29 DIAGNOSIS — I5032 Chronic diastolic (congestive) heart failure: Secondary | ICD-10-CM

## 2013-08-29 DIAGNOSIS — I509 Heart failure, unspecified: Secondary | ICD-10-CM

## 2013-08-29 DIAGNOSIS — N179 Acute kidney failure, unspecified: Secondary | ICD-10-CM

## 2013-08-29 NOTE — Progress Notes (Signed)
Patient ID: Wesley [name], male   DOB: [birth year], 79 y.o.   MRN: [id number] Facility: [facility] Chief complaint:SOB History: Frail 79 year old man who was admitted here in late August with falls secondary to lower extremity weakness and rhabdomyolysis. He was noted to be in diastolic CHF. Last labs on 8/27 showed A BUN of 22 Cr 1.8. He has been apparently been complaining of SOB. There is apparently discussion about discharge.   Past Medical History  Diagnosis Date  . Osteoarthrosis, unspecified whether generalized or localized, other specified sites   . Acute kidney failure with other specified pathological lesion in kidney   . Prostatitis   . Postsurgical percutaneous transluminal coronary angioplasty status     weintraub  . Cataract extraction status `  . Peripheral vascular disease, unspecified   . Personal history of malignant neoplasm of larynx   . Elevated prostate specific antigen (PSA)   . Gout, unspecified   . Acute peptic ulcer with hemorrhage   . Unspecified essential hypertension   . Hyperlipidemia   . Diastolic heart failure   . Pulmonary edema   . FTT (failure to thrive) in adult   . Cancer     throat   Medications: lasix 40 mg qd, KCL qd, norvasc 5 mgqd, ecasa 81, Metoprolol 50mg  qd,  Review of System:  Resp: denies SOB, cough CVS: no chest pain ABD: no pain GU: chronic foley followed by urology.  Physical Exam: General: Frail man no distress 110/68 pulse 84 T 97.9 RR 15  sat 96% on room air Resp: decreased a/e. Hyper res to percussion  CVS: barrel chest. Decreased HS No evidence of chf ABD; midline scar; no masses Skin; wounds have healed   Impression/plan #1 diastolic CHF I see no obvious right evidence of this I will get a chest x-ray on him. #2 I suspect he has underlying COPD/emphysema he does not need current oxygen #3 chronic renal insufficiency I will recheck the

## 2013-09-08 ENCOUNTER — Encounter: Payer: Self-pay | Admitting: Adult Health

## 2013-09-08 ENCOUNTER — Non-Acute Institutional Stay (SKILLED_NURSING_FACILITY): Payer: Medicare Other | Admitting: Adult Health

## 2013-09-08 DIAGNOSIS — I10 Essential (primary) hypertension: Secondary | ICD-10-CM

## 2013-09-08 DIAGNOSIS — R29898 Other symptoms and signs involving the musculoskeletal system: Secondary | ICD-10-CM

## 2013-09-08 DIAGNOSIS — I5032 Chronic diastolic (congestive) heart failure: Secondary | ICD-10-CM

## 2013-09-08 DIAGNOSIS — M6282 Rhabdomyolysis: Secondary | ICD-10-CM

## 2013-09-08 DIAGNOSIS — I509 Heart failure, unspecified: Secondary | ICD-10-CM

## 2013-09-08 NOTE — Progress Notes (Signed)
Patient ID: Wesley [name], male   DOB: August 18, 1920, 79 y.o.   MRN: [id number]  [place]  No Known Allergies   Chief Complaint  Patient presents with  . Discharge Note    HPI: He is being discharged to home; he had been hospitalized for weakness; rhabdomyolysis; and diastolic heart failure. He was admitted to this facility for short term rehab and is now ready for discharge to home. He will be on thin liquids with a pureed diet. He will need home health for pt/ot/st/nursing/cna. He will need prescriptions to be written and will need a wheelchair.  Past Medical History  Diagnosis Date  . Osteoarthrosis, unspecified whether generalized or localized, other specified sites   . Acute kidney failure with other specified pathological lesion in kidney   . Prostatitis   . Postsurgical percutaneous transluminal coronary angioplasty status     weintraub  . Cataract extraction status `  . Peripheral vascular disease, unspecified   . Personal history of malignant neoplasm of larynx   . Elevated prostate specific antigen (PSA)   . Gout, unspecified   . Acute peptic ulcer with hemorrhage   . Unspecified essential hypertension   . Hyperlipidemia   . Diastolic heart failure   . Pulmonary edema   . FTT (failure to thrive) in adult   . Cancer     throat    Past Surgical History  Procedure Laterality Date  . Percutaneous translumional coronary angioplastty      stents to bilateral legs for PVD  . Cataract extraction    . Truncal vagotomy  06/2005  . Gastrojejunosotomy  06/2005  . Cholecystectomy  06/2005    VITAL SIGNS BP 118/62  Pulse 78  Wt 112 lb (50.803 kg)  BMI 15.63 kg/m2   Patient's Medications  New Prescriptions   No medications on file  Previous Medications   ACETAMINOPHEN (TYLENOL) 325 MG TABLET    Take 2 tablets (650 mg total) by mouth every 6 (six) hours as needed for pain.   AMLODIPINE (NORVASC) 5 MG TABLET    Take 5 mg by mouth daily.   ASPIRIN EC 81 MG TABLET    Take  81 mg by mouth daily.   FUROSEMIDE (LASIX) 40 MG TABLET    Take 1 tablet (40 mg total) by mouth daily.   LACTOSE FREE NUTRITION (BOOST) LIQD    Take 237 mLs by mouth 2 (two) times daily between meals.   MEGESTROL (MEGACE) 400 MG/10ML SUSPENSION    Take 10 mLs (400 mg total) by mouth daily.   METOPROLOL (LOPRESSOR) 50 MG TABLET    Take 50 mg by mouth daily.   ONDANSETRON (ZOFRAN) 4 MG TABLET    Take 1 tablet (4 mg total) by mouth every 6 (six) hours as needed for nausea.   POTASSIUM CHLORIDE (K-DUR,KLOR-CON) 10 MEQ TABLET    Take 2 tablets (20 mEq total) by mouth daily.   SENNOSIDES-DOCUSATE SODIUM (SENOKOT-S) 8.6-50 MG TABLET    Take 1 tablet by mouth daily.  Modified Medications   No medications on file  Discontinued Medications   STARCH-MALTO DEXTRIN (THICK-IT) POWD    Take 1 scoop by mouth as needed. Use to make all liquids nectar thick.    SIGNIFICANT DIAGNOSTIC EXAMS     Component Value Date/Time   ALBUMIN 2.7* 07/27/2013 2044   AST 33 07/27/2013 2044   ALT 9 07/27/2013 2044   ALKPHOS 83 07/27/2013 2044   BILITOT 0.8 07/27/2013 2044       Component Value  Date/Time   BUN 27* 07/27/2013 2044   GLUCOSE 105* 07/27/2013 2044   CREATININE 1.89* 07/27/2013 2044   K 4.6 07/27/2013 2245   NA 128* 07/27/2013 2044   TSH 1.36 08/02/2012 0930       Component Value Date/Time   WBC 6.7 07/27/2013 2044   RBC 3.04* 07/27/2013 2044   HGB 10.7* 07/27/2013 2044   HCT 30.1* 07/27/2013 2044   PLT 495* 07/27/2013 2044   MCV 99.0 07/27/2013 2044   Review of Systems  Constitutional: Negative for malaise/fatigue.  Respiratory: Negative for cough and shortness of breath.   Cardiovascular: Negative for chest pain and leg swelling.  Gastrointestinal: Negative for heartburn and abdominal pain.  Genitourinary:       Has foley  Musculoskeletal: Negative for myalgias.  Skin: Negative.   Neurological: Negative for headaches.  Psychiatric/Behavioral: Negative for depression. The patient does not have insomnia.      Physical Exam  Constitutional:  frail  Neck: Neck supple. No JVD present.  Cardiovascular: Normal rate, regular rhythm and intact distal pulses.   Respiratory: Effort normal and breath sounds normal. No respiratory distress. He has no wheezes.  GI: Soft. Bowel sounds are normal. He exhibits no distension. There is no tenderness.  Genitourinary:  Has foley  Musculoskeletal: He exhibits no edema.  Is able to move all extremities.   Neurological: He is alert.  Skin: Skin is warm and dry.       ASSESSMENT/ PLAN:  Will discharge him to home with home health for pt/ot/st/nursing and cna. He will go home with a foley. He will need a wheelchair in order to maintain his level of independence with his adl's such as grooming; which cannot be achieved with a walker due to his weight loss; and congestive heart failure. His prescriptions have been written.    Time spent with patient 40 minutes.

## 2013-09-09 ENCOUNTER — Emergency Department (HOSPITAL_COMMUNITY): Payer: Medicare Other

## 2013-09-09 ENCOUNTER — Encounter (HOSPITAL_COMMUNITY): Payer: Self-pay | Admitting: Emergency Medicine

## 2013-09-09 ENCOUNTER — Inpatient Hospital Stay (HOSPITAL_COMMUNITY)
Admission: EM | Admit: 2013-09-09 | Discharge: 2013-09-13 | DRG: 872 | Disposition: A | Discharge status: Skilled Nursing Facility | Payer: Medicare Other | Attending: Internal Medicine | Admitting: Internal Medicine

## 2013-09-09 DIAGNOSIS — R634 Abnormal weight loss: Secondary | ICD-10-CM

## 2013-09-09 DIAGNOSIS — I509 Heart failure, unspecified: Secondary | ICD-10-CM | POA: Diagnosis present

## 2013-09-09 DIAGNOSIS — Z7401 Bed confinement status: Secondary | ICD-10-CM

## 2013-09-09 DIAGNOSIS — IMO0002 Reserved for concepts with insufficient information to code with codable children: Secondary | ICD-10-CM | POA: Diagnosis present

## 2013-09-09 DIAGNOSIS — Z8042 Family history of malignant neoplasm of prostate: Secondary | ICD-10-CM

## 2013-09-09 DIAGNOSIS — I129 Hypertensive chronic kidney disease with stage 1 through stage 4 chronic kidney disease, or unspecified chronic kidney disease: Secondary | ICD-10-CM | POA: Diagnosis present

## 2013-09-09 DIAGNOSIS — N138 Other obstructive and reflux uropathy: Secondary | ICD-10-CM

## 2013-09-09 DIAGNOSIS — R5381 Other malaise: Secondary | ICD-10-CM | POA: Diagnosis present

## 2013-09-09 DIAGNOSIS — R627 Adult failure to thrive: Secondary | ICD-10-CM

## 2013-09-09 DIAGNOSIS — F17200 Nicotine dependence, unspecified, uncomplicated: Secondary | ICD-10-CM | POA: Diagnosis present

## 2013-09-09 DIAGNOSIS — T68XXXA Hypothermia, initial encounter: Secondary | ICD-10-CM

## 2013-09-09 DIAGNOSIS — E860 Dehydration: Secondary | ICD-10-CM | POA: Diagnosis present

## 2013-09-09 DIAGNOSIS — E876 Hypokalemia: Secondary | ICD-10-CM | POA: Diagnosis present

## 2013-09-09 DIAGNOSIS — N401 Enlarged prostate with lower urinary tract symptoms: Secondary | ICD-10-CM | POA: Diagnosis present

## 2013-09-09 DIAGNOSIS — N320 Bladder-neck obstruction: Secondary | ICD-10-CM | POA: Diagnosis present

## 2013-09-09 DIAGNOSIS — Z8521 Personal history of malignant neoplasm of larynx: Secondary | ICD-10-CM

## 2013-09-09 DIAGNOSIS — I5032 Chronic diastolic (congestive) heart failure: Secondary | ICD-10-CM | POA: Diagnosis present

## 2013-09-09 DIAGNOSIS — N19 Unspecified kidney failure: Secondary | ICD-10-CM | POA: Diagnosis present

## 2013-09-09 DIAGNOSIS — R259 Unspecified abnormal involuntary movements: Secondary | ICD-10-CM | POA: Diagnosis present

## 2013-09-09 DIAGNOSIS — A419 Sepsis, unspecified organism: Principal | ICD-10-CM | POA: Diagnosis present

## 2013-09-09 DIAGNOSIS — I10 Essential (primary) hypertension: Secondary | ICD-10-CM

## 2013-09-09 DIAGNOSIS — N189 Chronic kidney disease, unspecified: Secondary | ICD-10-CM | POA: Diagnosis present

## 2013-09-09 DIAGNOSIS — N390 Urinary tract infection, site not specified: Secondary | ICD-10-CM | POA: Diagnosis present

## 2013-09-09 DIAGNOSIS — Z66 Do not resuscitate: Secondary | ICD-10-CM | POA: Diagnosis present

## 2013-09-09 DIAGNOSIS — T68XXXD Hypothermia, subsequent encounter: Secondary | ICD-10-CM

## 2013-09-09 DIAGNOSIS — N179 Acute kidney failure, unspecified: Secondary | ICD-10-CM

## 2013-09-09 DIAGNOSIS — I2510 Atherosclerotic heart disease of native coronary artery without angina pectoris: Secondary | ICD-10-CM | POA: Diagnosis present

## 2013-09-09 DIAGNOSIS — I739 Peripheral vascular disease, unspecified: Secondary | ICD-10-CM | POA: Diagnosis present

## 2013-09-09 DIAGNOSIS — Z681 Body mass index (BMI) 19 or less, adult: Secondary | ICD-10-CM

## 2013-09-09 DIAGNOSIS — Z9861 Coronary angioplasty status: Secondary | ICD-10-CM

## 2013-09-09 DIAGNOSIS — E43 Unspecified severe protein-calorie malnutrition: Secondary | ICD-10-CM | POA: Insufficient documentation

## 2013-09-09 LAB — URINE MICROSCOPIC-ADD ON

## 2013-09-09 LAB — CBC WITH DIFFERENTIAL/PLATELET
Basophils Relative: 0 % (ref 0–1)
Eosinophils Absolute: 0 10*3/uL (ref 0.0–0.7)
Lymphs Abs: 0.8 10*3/uL (ref 0.7–4.0)
MCH: 32.5 pg (ref 26.0–34.0)
Neutrophils Relative %: 86 % — ABNORMAL HIGH (ref 43–77)
Platelets: 253 10*3/uL (ref 150–400)
RBC: 3.57 MIL/uL — ABNORMAL LOW (ref 4.22–5.81)
WBC: 8.8 10*3/uL (ref 4.0–10.5)

## 2013-09-09 LAB — URINALYSIS, ROUTINE W REFLEX MICROSCOPIC
Glucose, UA: NEGATIVE mg/dL
Specific Gravity, Urine: 1.019 (ref 1.005–1.030)
pH: 5.5 (ref 5.0–8.0)

## 2013-09-09 LAB — COMPREHENSIVE METABOLIC PANEL
ALT: 9 U/L (ref 0–53)
AST: 19 U/L (ref 0–37)
CO2: 12 mEq/L — ABNORMAL LOW (ref 19–32)
Calcium: 9.9 mg/dL (ref 8.4–10.5)
Sodium: 129 mEq/L — ABNORMAL LOW (ref 135–145)
Total Protein: 8.3 g/dL (ref 6.0–8.3)

## 2013-09-09 LAB — CG4 I-STAT (LACTIC ACID): Lactic Acid, Venous: 10.78 mmol/L — ABNORMAL HIGH (ref 0.5–2.2)

## 2013-09-09 LAB — PRO B NATRIURETIC PEPTIDE: Pro B Natriuretic peptide (BNP): 6364 pg/mL — ABNORMAL HIGH (ref 0–450)

## 2013-09-09 MED ORDER — SODIUM CHLORIDE 0.9 % IV BOLUS (SEPSIS)
500.0000 mL | Freq: Once | INTRAVENOUS | Status: AC
  Administered 2013-09-09: 500 mL via INTRAVENOUS

## 2013-09-09 MED ORDER — VANCOMYCIN HCL 500 MG IV SOLR
500.0000 mg | Freq: Once | INTRAVENOUS | Status: AC
  Administered 2013-09-10: 500 mg via INTRAVENOUS
  Filled 2013-09-09: qty 500

## 2013-09-09 MED ORDER — PIPERACILLIN-TAZOBACTAM IN DEX 2-0.25 GM/50ML IV SOLN
2.2500 g | Freq: Once | INTRAVENOUS | Status: AC
  Administered 2013-09-09: 2.25 g via INTRAVENOUS
  Filled 2013-09-09: qty 50

## 2013-09-09 NOTE — H&P (Signed)
[facility] History and Physical  Nasser Ku ZOX:[id number] DOB: Aug 21, 1920    PCP:   Illene Regulus, MD   Chief Complaint: general malaise.  HPI: Wesley [name] is an 79 y.o. male with hx of laryngeal cancer, PVD, CKD, diastolic CHF, FTT, CAD, just discharged home from SNF yesterday to the care of his grandson, brought back to the ER at [facility] tonight as his grandson felt he couldn't adequately take care of patient.  The patient was complaining of HA and shortness of breath, but he denied any those symptoms in the ER.  She has a indwelling catheter, and it was replaced, yielded purulent urine.  He also was found to be hyperthermic, with 95 F, and was placed on a bear hugger.  He has no leukocytosis, with did have lactic acid of 10.  His bicarb was 12, with Na 129.  He has elevated Cr to 2.28.  His CXR showed no definite infiltrate, and he has labile BP, 99 to 120 SBP.   His UA is still pending, and hospitalist was asked to admit him for sepsis, likely urosepsis.  Rewiew of Systems:   ROS showed negative for complaints, but it may not be meaningful.   Past Medical History  Diagnosis Date  . Osteoarthrosis, unspecified whether generalized or localized, other specified sites   . Acute kidney failure with other specified pathological lesion in kidney   . Prostatitis   . Postsurgical percutaneous transluminal coronary angioplasty status     weintraub  . Cataract extraction status `  . Peripheral vascular disease, unspecified   . Personal history of malignant neoplasm of larynx   . Elevated prostate specific antigen (PSA)   . Gout, unspecified   . Acute peptic ulcer with hemorrhage   . Unspecified essential hypertension   . Hyperlipidemia   . Diastolic heart failure   . Pulmonary edema   . FTT (failure to thrive) in adult   . Cancer     throat    Past Surgical History  Procedure Laterality Date  . Percutaneous translumional coronary angioplastty      stents to bilateral legs for PVD   . Cataract extraction    . Truncal vagotomy  06/2005  . Gastrojejunosotomy  06/2005  . Cholecystectomy  06/2005    Medications:  HOME MEDS: Prior to Admission medications   Medication Sig Start Date End Date Taking? Authorizing Provider  acetaminophen (TYLENOL) 325 MG tablet Take 2 tablets (650 mg total) by mouth every 6 (six) hours as needed for pain. 07/17/13  Yes Jacques Navy, MD  amLODipine (NORVASC) 5 MG tablet Take 5 mg by mouth daily.   Yes Historical Provider, MD  aspirin EC 81 MG tablet Take 81 mg by mouth daily.   Yes Historical Provider, MD  furosemide (LASIX) 40 MG tablet Take 1 tablet (40 mg total) by mouth daily. 07/17/13  Yes Jacques Navy, MD  lactose free nutrition (BOOST) LIQD Take 237 mLs by mouth 2 (two) times daily between meals.   Yes Historical Provider, MD  megestrol (MEGACE) 400 MG/10ML suspension Take 10 mLs (400 mg total) by mouth daily. 07/27/13  Yes Jacques Navy, MD  metoprolol (LOPRESSOR) 50 MG tablet Take 50 mg by mouth daily.   Yes Historical Provider, MD  ondansetron (ZOFRAN) 4 MG tablet Take 1 tablet (4 mg total) by mouth every 6 (six) hours as needed for nausea. 07/17/13  Yes Jacques Navy, MD  potassium chloride (K-DUR,KLOR-CON) 10 MEQ tablet Take 2 tablets (20  mEq total) by mouth daily. 07/17/13  Yes Jacques Navy, MD  sennosides-docusate sodium (SENOKOT-S) 8.6-50 MG tablet Take 1 tablet by mouth daily.   Yes Historical Provider, MD     Allergies:  No Known Allergies  Social History:   reports that he has been smoking Cigarettes and Cigars.  He has been smoking about 0.50 packs per day. He does not have any smokeless tobacco history on file. He reports that he drinks alcohol. He reports that he does not use illicit drugs.  Family History: Family History  Problem Relation Age of Onset  . Heart disease Father   . Prostate cancer Brother   . Heart disease Brother      Physical Exam: Filed Vitals:   09/09/13 1846 09/09/13 2118  09/09/13 2124 09/09/13 2130  BP:   99/65 108/70  Pulse:   92   Temp:  95.7 F (35.4 C)    TempSrc:  Rectal    Resp:   24 14  SpO2: 100%  100%    Blood pressure 108/70, pulse 92, temperature 95.7 F (35.4 C), temperature source Rectal, resp. rate 14, SpO2 100.00%.  GEN:  Pleasant patient lying in the stretcher in no acute distress; cooperative with exam. Cachectic. PSYCH:  alert and orient, with intermittent confusion. does not appear anxious or depressed; affect is appropriate. HEENT: Mucous membranes pink and anicteric; PERRLA; EOM intact; no cervical lymphadenopathy nor thyromegaly or carotid bruit; no JVD; There were no stridor. Neck is very supple. Breasts:: Not examined CHEST WALL: No tenderness CHEST: Normal respiration, clear to auscultation bilaterally.  HEART: Regular rate and rhythm.  There are no murmur, rub, or gallops.   BACK: No kyphosis or scoliosis; no CVA tenderness ABDOMEN: soft and non-tender; no masses, no organomegaly, normal abdominal bowel sounds; no pannus; no intertriginous candida. There is no rebound and no distention. Rectal Exam: Not done EXTREMITIES: No bone or joint deformity; age-appropriate arthropathy of the hands and knees; no edema; no ulcerations.  There is no calf tenderness. Genitalia: not examined PULSES: 2+ and symmetric SKIN: Normal hydration no rash or ulceration CNS: Cranial nerves 2-12 grossly intact no focal lateralizing neurologic deficit.  Speech is fluent; uvula elevated with phonation, facial symmetry and tongue midline. DTR are normal bilaterally, cerebella exam is intact, barbinski is negative and strengths are equaled bilaterally.  No sensory loss.   Labs on Admission:  Basic Metabolic Panel:  Recent Labs Lab 09/09/13 2002  NA 129*  K 4.1  CL 92*  CO2 12*  GLUCOSE 153*  BUN 48*  CREATININE 2.28*  CALCIUM 9.9   Liver Function Tests:  Recent Labs Lab 09/09/13 2002  AST 19  ALT 9  ALKPHOS 104  BILITOT 1.3*  PROT 8.3   ALBUMIN 3.1*   No results found for this basename: LIPASE, AMYLASE,  in the last 168 hours No results found for this basename: AMMONIA,  in the last 168 hours CBC:  Recent Labs Lab 09/09/13 2002  WBC 8.8  NEUTROABS 7.5  HGB 11.6*  HCT 33.3*  MCV 93.3  PLT 253   Cardiac Enzymes:  Recent Labs Lab 09/09/13 2002  CKTOTAL 78    CBG: No results found for this basename: GLUCAP,  in the last 168 hours   Radiological Exams on Admission: Dg Chest 2 View  09/09/2013   CLINICAL DATA:  Short of breath and weakness  EXAM: CHEST  2 VIEW  COMPARISON:  Chest radiograph 07/27/2013 CT thorax 03/29/2007 the  FINDINGS: Normal mediastinum and  cardiac silhouette. There is scarring at the left lung base which is improved compared to prior. Scarring and atelectasis at the left lung base which is slightly improved compared to prior. Lungs are hyperinflated. Osteopenia noted. No acute fracture of the thoracic spine noted.  IMPRESSION: Emphysematous change without acute findings.   Electronically Signed   By: Genevive Bi M.D.   On: 09/09/2013 19:57    Assessment/Plan Present on Admission:  . Sepsis . UTI (urinary tract infection) . IDIOPATHIC TREMOR . PERIPHERAL VASCULAR DISEASE . Renal failure . Failure to thrive  PLAN:  Will admit him for sepsis, likely urosepsis.  Urine and BC have been sent, and will start him on VAN/Zosyn.  Because of soft BP, will hold betablocker for now.  His foley was changed. Also, he will not be given significant amount of fluid because of his hx of CHF, but his lasix will be held.  His grandson also stated he cannot take care of patient at home, so I have consulted SS for help with disposition eventually.  He is hypothermic, likely from sepsis, but he has hypothyroidism, so will check TSH as well.  He is stable, full code, and will be admitted to Dr Izora Ribas service.  I have called and left a message at this office.  Thank you.  Other plans as per orders.  Code  Status: FULL Unk Lightning, MD. [facility] Pager [phone] 7pm to 7am.  09/09/2013, 11:07 PM

## 2013-09-09 NOTE — ED Notes (Signed)
Dr. Fredderick Phenix made aware of patient current temperature. Bear hugger placed. No urine output in foley at this time. Reorder bolus. Pt reposition. Resting

## 2013-09-09 NOTE — ED Notes (Signed)
Pt from home, per pt Grandson, pt has been SOB x 80month,. Pt was in rehab until today after a fall. Pt in NAD and A&O

## 2013-09-09 NOTE — ED Provider Notes (Signed)
CSN: [id number]     Arrival date & time 09/09/13  1826 History   First MD Initiated Contact with Patient 09/09/13 1841     Chief Complaint  Patient presents with  . Shortness of Breath   (Consider location/radiation/quality/duration/timing/severity/associated sxs/prior Treatment) HPI Comments: Patient is 79 year old gentleman who was released from the rehab facility today into the care of his grandson.  According to the grandson, the patient complained of headache and shortness of breath once home with any exertion.  He also states that the patient has not eaten at all today and when talking with the patient he cannot remember when he last ate.  He has previous admissions for CHF, ARF and rhabdomyolysis.  His grandson states that he may not be able to take care of him at home any longer.  Patient denies complaints at this time but also complains that "someone stole my TV".    Patient is a 79 y.o. male presenting with shortness of breath. The history is provided by a caregiver, a relative and medical records. The history is limited by the condition of the patient.  Shortness of Breath Severity:  Severe Onset quality:  Gradual Duration:  12 hours Timing:  Constant Progression:  Worsening Chronicity:  Chronic Context: activity   Relieved by:  Nothing Worsened by:  Nothing tried Ineffective treatments:  None tried Associated symptoms: no abdominal pain, no cough, no diaphoresis, no fever, no rash and no vomiting     Past Medical History  Diagnosis Date  . Osteoarthrosis, unspecified whether generalized or localized, other specified sites   . Acute kidney failure with other specified pathological lesion in kidney   . Prostatitis   . Postsurgical percutaneous transluminal coronary angioplasty status     weintraub  . Cataract extraction status `  . Peripheral vascular disease, unspecified   . Personal history of malignant neoplasm of larynx   . Elevated prostate specific antigen (PSA)    . Gout, unspecified   . Acute peptic ulcer with hemorrhage   . Unspecified essential hypertension   . Hyperlipidemia   . Diastolic heart failure   . Pulmonary edema   . FTT (failure to thrive) in adult   . Cancer     throat   Past Surgical History  Procedure Laterality Date  . Percutaneous translumional coronary angioplastty      stents to bilateral legs for PVD  . Cataract extraction    . Truncal vagotomy  06/2005  . Gastrojejunosotomy  06/2005  . Cholecystectomy  06/2005   Family History  Problem Relation Age of Onset  . Heart disease Father   . Prostate cancer Brother   . Heart disease Brother    History  Substance Use Topics  . Smoking status: Current Some Day Smoker -- 0.50 packs/day    Types: Cigarettes, Cigars  . Smokeless tobacco: Not on file  . Alcohol Use: Yes     Comment: 2-3 shots of scotch per day    Review of Systems  Unable to perform ROS: Dementia  Constitutional: Negative for fever and diaphoresis.  Respiratory: Positive for shortness of breath. Negative for cough.   Gastrointestinal: Negative for vomiting and abdominal pain.  Skin: Negative for rash.    Allergies  Review of patient's allergies indicates no known allergies.  Home Medications   Current Outpatient Rx  Name  Route  Sig  Dispense  Refill  . acetaminophen (TYLENOL) 325 MG tablet   Oral   Take 2 tablets (650 mg total)  by mouth every 6 (six) hours as needed for pain.   60 tablet      . amLODipine (NORVASC) 5 MG tablet   Oral   Take 5 mg by mouth daily.         Marland Kitchen aspirin EC 81 MG tablet   Oral   Take 81 mg by mouth daily.         . furosemide (LASIX) 40 MG tablet   Oral   Take 1 tablet (40 mg total) by mouth daily.   60 tablet   5   . lactose free nutrition (BOOST) LIQD   Oral   Take 237 mLs by mouth 2 (two) times daily between meals.         . megestrol (MEGACE) 400 MG/10ML suspension   Oral   Take 10 mLs (400 mg total) by mouth daily.   480 mL   2   .  metoprolol (LOPRESSOR) 50 MG tablet   Oral   Take 50 mg by mouth daily.         . ondansetron (ZOFRAN) 4 MG tablet   Oral   Take 1 tablet (4 mg total) by mouth every 6 (six) hours as needed for nausea.   20 tablet   0   . potassium chloride (K-DUR,KLOR-CON) 10 MEQ tablet   Oral   Take 2 tablets (20 mEq total) by mouth daily.   30 tablet   5   . sennosides-docusate sodium (SENOKOT-S) 8.6-50 MG tablet   Oral   Take 1 tablet by mouth daily.          BP 116/71  Pulse 88  Temp(Src) 95.7 F (35.4 C) (Rectal)  Resp 20  SpO2 100% Physical Exam  Nursing note and vitals reviewed. Constitutional: He appears well-developed.  cachetic  HENT:  Head: Normocephalic and atraumatic.  Right Ear: External ear normal.  Left Ear: External ear normal.  Nose: Nose normal.  Dry mucous membranes  Eyes: Conjunctivae are normal. No scleral icterus.  Arcus senilis  Neck: Normal range of motion. No JVD present.  Cardiovascular: Normal rate, regular rhythm and normal heart sounds.  Exam reveals no gallop and no friction rub.   No murmur heard. Pulmonary/Chest: Effort normal and breath sounds normal. No respiratory distress. He has no wheezes. He has no rales. He exhibits no tenderness.  Abdominal: Soft. Bowel sounds are normal. He exhibits no distension. There is no tenderness.  Genitourinary:  Indwelling foley catheter  Musculoskeletal: Normal range of motion. He exhibits no edema and no tenderness.  Neurological: He is alert. He exhibits normal muscle tone. Coordination normal.  Skin: Skin is warm and dry. No rash noted. No erythema. No pallor.    ED Course  Procedures (including critical care time) Labs Review Labs Reviewed  CBC WITH DIFFERENTIAL - Abnormal; Notable for the following:    RBC 3.57 (*)    Hemoglobin 11.6 (*)    HCT 33.3 (*)    Neutrophils Relative % 86 (*)    Lymphocytes Relative 9 (*)    All other components within normal limits  PRO B NATRIURETIC PEPTIDE -  Abnormal; Notable for the following:    Pro B Natriuretic peptide (BNP) 6364.0 (*)    All other components within normal limits  COMPREHENSIVE METABOLIC PANEL - Abnormal; Notable for the following:    Sodium 129 (*)    Chloride 92 (*)    CO2 12 (*)    Glucose, Bld 153 (*)    BUN 48 (*)  Creatinine, Ser 2.28 (*)    Albumin 3.1 (*)    Total Bilirubin 1.3 (*)    GFR calc non Af Amer 23 (*)    GFR calc Af Amer 27 (*)    All other components within normal limits  URINALYSIS, ROUTINE W REFLEX MICROSCOPIC - Abnormal; Notable for the following:    Color, Urine AMBER (*)    APPearance TURBID (*)    Hgb urine dipstick LARGE (*)    Bilirubin Urine SMALL (*)    Protein, ur 100 (*)    Leukocytes, UA LARGE (*)    All other components within normal limits  URINE MICROSCOPIC-ADD ON - Abnormal; Notable for the following:    Bacteria, UA FEW (*)    All other components within normal limits  CG4 I-STAT (LACTIC ACID) - Abnormal; Notable for the following:    Lactic Acid, Venous 10.78 (*)    All other components within normal limits  CULTURE, BLOOD (ROUTINE X 2)  CULTURE, BLOOD (ROUTINE X 2)  URINE CULTURE  CK   Imaging Review Dg Chest 2 View  09/09/2013   CLINICAL DATA:  Short of breath and weakness  EXAM: CHEST  2 VIEW  COMPARISON:  Chest radiograph 07/27/2013 CT thorax 03/29/2007 the  FINDINGS: Normal mediastinum and cardiac silhouette. There is scarring at the left lung base which is improved compared to prior. Scarring and atelectasis at the left lung base which is slightly improved compared to prior. Lungs are hyperinflated. Osteopenia noted. No acute fracture of the thoracic spine noted.  IMPRESSION: Emphysematous change without acute findings.   Electronically Signed   By: Genevive Bi M.D.   On: 09/09/2013 19:57    EKG Interpretation     Ventricular Rate:  156 PR Interval:  136 QRS Duration: 180 QT Interval:  300 QTC Calculation: 483 R Axis:   47 Text Interpretation:   Undetermined rhythm Non-specific intra-ventricular conduction block Abnormal ECG Sinus tachycardia Artifact since last tracing no significant change           Results for orders placed during the hospital encounter of 09/09/13  CBC WITH DIFFERENTIAL      Result Value Range   WBC 8.8  4.0 - 10.5 K/uL   RBC 3.57 (*) 4.22 - 5.81 MIL/uL   Hemoglobin 11.6 (*) 13.0 - 17.0 g/dL   HCT 40.9 (*) 81.1 - 91.4 %   MCV 93.3  78.0 - 100.0 fL   MCH 32.5  26.0 - 34.0 pg   MCHC 34.8  30.0 - 36.0 g/dL   RDW 78.2  95.6 - 21.3 %   Platelets 253  150 - 400 K/uL   Neutrophils Relative % 86 (*) 43 - 77 %   Neutro Abs 7.5  1.7 - 7.7 K/uL   Lymphocytes Relative 9 (*) 12 - 46 %   Lymphs Abs 0.8  0.7 - 4.0 K/uL   Monocytes Relative 5  3 - 12 %   Monocytes Absolute 0.5  0.1 - 1.0 K/uL   Eosinophils Relative 0  0 - 5 %   Eosinophils Absolute 0.0  0.0 - 0.7 K/uL   Basophils Relative 0  0 - 1 %   Basophils Absolute 0.0  0.0 - 0.1 K/uL  PRO B NATRIURETIC PEPTIDE      Result Value Range   Pro B Natriuretic peptide (BNP) 6364.0 (*) 0 - 450 pg/mL  COMPREHENSIVE METABOLIC PANEL      Result Value Range   Sodium 129 (*) 135 - 145  mEq/L   Potassium 4.1  3.5 - 5.1 mEq/L   Chloride 92 (*) 96 - 112 mEq/L   CO2 12 (*) 19 - 32 mEq/L   Glucose, Bld 153 (*) 70 - 99 mg/dL   BUN 48 (*) 6 - 23 mg/dL   Creatinine, Ser 1.61 (*) 0.50 - 1.35 mg/dL   Calcium 9.9  8.4 - 09.6 mg/dL   Total Protein 8.3  6.0 - 8.3 g/dL   Albumin 3.1 (*) 3.5 - 5.2 g/dL   AST 19  0 - 37 U/L   ALT 9  0 - 53 U/L   Alkaline Phosphatase 104  39 - 117 U/L   Total Bilirubin 1.3 (*) 0.3 - 1.2 mg/dL   GFR calc non Af Amer 23 (*) >90 mL/min   GFR calc Af Amer 27 (*) >90 mL/min  CK      Result Value Range   Total CK 78  7 - 232 U/L  URINALYSIS, ROUTINE W REFLEX MICROSCOPIC      Result Value Range   Color, Urine AMBER (*) YELLOW   APPearance TURBID (*) CLEAR   Specific Gravity, Urine 1.019  1.005 - 1.030   pH 5.5  5.0 - 8.0   Glucose, UA  NEGATIVE  NEGATIVE mg/dL   Hgb urine dipstick LARGE (*) NEGATIVE   Bilirubin Urine SMALL (*) NEGATIVE   Ketones, ur NEGATIVE  NEGATIVE mg/dL   Protein, ur 045 (*) NEGATIVE mg/dL   Urobilinogen, UA 1.0  0.0 - 1.0 mg/dL   Nitrite NEGATIVE  NEGATIVE   Leukocytes, UA LARGE (*) NEGATIVE  URINE MICROSCOPIC-ADD ON      Result Value Range   WBC, UA TOO NUMEROUS TO COUNT  <3 WBC/hpf   RBC / HPF FIELD OBSCURED BY WBC'S  <3 RBC/hpf   Bacteria, UA FEW (*) RARE   Urine-Other URINALYSIS PERFORMED ON SUPERNATANT    CG4 I-STAT (LACTIC ACID)      Result Value Range   Lactic Acid, Venous 10.78 (*) 0.5 - 2.2 mmol/L   Dg Chest 2 View  09/09/2013   CLINICAL DATA:  Short of breath and weakness  EXAM: CHEST  2 VIEW  COMPARISON:  Chest radiograph 07/27/2013 CT thorax 03/29/2007 the  FINDINGS: Normal mediastinum and cardiac silhouette. There is scarring at the left lung base which is improved compared to prior. Scarring and atelectasis at the left lung base which is slightly improved compared to prior. Lungs are hyperinflated. Osteopenia noted. No acute fracture of the thoracic spine noted.  IMPRESSION: Emphysematous change without acute findings.   Electronically Signed   By: Genevive Bi M.D.   On: 09/09/2013 19:57   Dg Swallowing Func-speech Pathology  08/14/2013   Riley Nearing Deblois, CCC-SLP     08/14/2013  1:50 PM Objective Swallowing Evaluation: Modified Barium Swallowing Study   Patient Details  Name: Leeon Makar MRN: [id number] Date of Birth: 05/20/1920  Today's Date: 08/14/2013 Time: 1150-1220 SLP Time Calculation (min): 30 min  Past Medical History:  Past Medical History  Diagnosis Date  . Osteoarthrosis, unspecified whether generalized or localized,  other specified sites   . Acute kidney failure with other specified pathological lesion  in kidney   . Prostatitis   . Postsurgical percutaneous transluminal coronary angioplasty  status     weintraub  . Cataract extraction status `  . Peripheral vascular  disease, unspecified   . Personal history of malignant neoplasm of larynx   . Elevated prostate specific antigen (PSA)   . Gout, unspecified   .  Acute peptic ulcer with hemorrhage   . Unspecified essential hypertension   . Hyperlipidemia   . Diastolic heart failure   . Pulmonary edema   . FTT (failure to thrive) in adult   . Cancer     throat   Past Surgical History:  Past Surgical History  Procedure Laterality Date  . Percutaneous translumional coronary angioplastty      stents to bilateral legs for PVD  . Cataract extraction    . Truncal vagotomy  06/2005  . Gastrojejunosotomy  06/2005  . Cholecystectomy  06/2005   HPI:  Pt is a 79 year old SNF resident who has been referred for an  outpatient MBS. He is on Dys 1 diet with nectar thick liquids.  SLP reports is noted to cough dring or after meals and has  episodes of wet vocal quality. Pt had an MBS approx 1 month ago  with finding of primary cervical esopahgeal dn esophageal  dysphagia with silent aspiration of thin liquids. No  documentation of any f/u with GI or ENT. Pt has also suffered  from dehydration and unintentional weight loss. Unsure if pt has  dementia, but to this SLP her reports he lives alone, not at a  SNF and that he has no trouble swallowing.      Assessment / Plan / Recommendation Clinical Impression  Dysphagia Diagnosis: Suspected primary esophageal  dysphagia;Moderate cervical esophageal phase dysphagia;Moderate  pharyngeal phase dysphagia;Mild oral phase dysphagia Clinical impression: Pt presents with modest improvement in  function since last MBS that indicated severe cervical esophageal  and suspected esophageal dysphagia. Though these problems  persist, oral and oropharyngeal function improved and likely  compensating for deficits.   Pt appears to have a mild cognitive based oral dysphagia with  pumping and delayed transit of solid texture. He refused puree  and cracker, but was able to very slowly process soft fruit bar.  Pharyngeal phase  characterized by initally good airway protection  but then with moderate to severe pyriform sinus residuals  penetrating airway post swallow when at their maximum. Residue  due initally to reduced opening of prominent CP segment, but then  increasing from backflow from severe esophageal stasis,  particularly with thin liquids. This liquids were silently  aspirated with repeated surging of liquids to pharynx. Nectar  thick liquids seemed to transit more rapidly, possibly due to  weight, though suspect with a full meal with nectar thick liquids  are also aspirated post swallow.   Recommendations for liquids deferred to treating SLP. Pt may have  equal risk of aspiration with thin or nectar thick liquids,  though QOL may be signficantly improved with upgrade. Concerned  that upgrade of solid textures would significantly worsen  esophageal transit, therefore continue to recommend puree. Pt  pills should be crushed as they lodge at GE junction. Question if  referral to GI would be helpful.     Treatment Recommendation  Defer treatment plan to SLP at (Comment) (SNF)    Diet Recommendation Dysphagia 1 (Puree);Nectar-thick liquid;Thin  liquid (liquid texture deferred to treating SLP)   Liquid Administration via: Cup;No straw Medication Administration: Crushed with puree Supervision: Patient able to self feed Compensations: Slow rate;Small sips/bites;Follow solids with  liquid;Multiple dry swallows after each bite/sip Postural Changes and/or Swallow Maneuvers: Seated upright 90  degrees;Upright 30-60 min after meal    Other  Recommendations Recommended Consults: Consider GI  evaluation;Consider esophageal assessment Oral Care Recommendations: Oral care BID   Follow Up Recommendations  Skilled Nursing  facility    Frequency and Duration        Pertinent Vitals/Pain NA    SLP Swallow Goals     General HPI: Pt is a 79 year old SNF resident who has been  referred for an outpatient MBS. He is on Dys 1 diet with nectar  thick  liquids. SLP reports is noted to cough dring or after meals  and has episodes of wet vocal quality. Pt had an MBS approx 1  month ago with finding of primary cervical esopahgeal dn  esophageal dysphagia with silent aspiration of thin liquids. No  documentation of any f/u with GI or ENT. Pt has also suffered  from dehydration and unintentional weight loss. Unsure if pt has  dementia, but to this SLP her reports he lives alone, not at a  SNF and that he has no trouble swallowing.  Type of Study: Modified Barium Swallowing Study Reason for Referral: Objectively evaluate swallowing function Diet Prior to this Study: Dysphagia 1 (puree);Nectar-thick  liquids Temperature Spikes Noted: N/A Respiratory Status: Room air Behavior/Cognition: Alert;Confused Oral Cavity - Dentition: Dentures, top (bottom denture not in  place) Oral Motor / Sensory Function: Within functional limits Self-Feeding Abilities: Able to feed self Patient Positioning: Upright in chair Baseline Vocal Quality: Clear Volitional Cough: Strong Volitional Swallow: Able to elicit Anatomy: Other (Comment) (Curvature of cervical spine, appearance  of bony protrusion) Pharyngeal Secretions: Not observed secondary MBS    Reason for Referral Objectively evaluate swallowing function   Oral Phase Oral Preparation/Oral Phase Oral Phase: Impaired Oral - Nectar Oral - Nectar Cup: Within functional limits Oral - Thin Oral - Thin Cup: Within functional limits Oral - Thin Straw: Within functional limits Oral - Solids Oral - Puree: Lingual pumping;Reduced posterior  propulsion;Delayed oral transit Oral - Mechanical Soft: Lingual pumping;Reduced posterior  propulsion;Delayed oral transit   Pharyngeal Phase Pharyngeal Phase Pharyngeal Phase: Impaired Pharyngeal - Nectar Pharyngeal - Nectar Cup: Reduced pharyngeal  peristalsis;Penetration/Aspiration after swallow;Pharyngeal  residue - cp segment;Pharyngeal residue - pyriform sinuses Penetration/Aspiration details (nectar cup):  Material enters  airway, remains ABOVE vocal cords and not ejected out;Material  does not enter airway Pharyngeal - Thin Pharyngeal - Thin Cup: Reduced pharyngeal  peristalsis;Penetration/Aspiration after swallow;Pharyngeal  residue - cp segment;Pharyngeal residue - pyriform sinuses Penetration/Aspiration details (thin cup): Material does not  enter airway Pharyngeal - Thin Straw: Reduced pharyngeal  peristalsis;Penetration/Aspiration after swallow;Pharyngeal  residue - cp segment;Pharyngeal residue - pyriform sinuses Penetration/Aspiration details (thin straw): Material enters  airway, remains ABOVE vocal cords and not ejected out;Material  enters airway, passes BELOW cords without attempt by patient to  eject out (silent aspiration);Material enters airway, CONTACTS  cords and not ejected out Pharyngeal - Solids Pharyngeal - Puree: Not tested (Pt spit out puree, said it was  nasty) Pharyngeal - Mechanical Soft: Pharyngeal residue - cp  segment;Pharyngeal residue - pyriform sinuses Pharyngeal - Pill: Pharyngeal residue - cp segment;Pharyngeal  residue - pyriform sinuses  Cervical Esophageal Phase    GO    Cervical Esophageal Phase Cervical Esophageal Phase: Impaired Cervical Esophageal Phase - Comment Cervical Esophageal Comment: see imporession statement    Functional Assessment Tool Used: clinical judgement Functional Limitations: Swallowing Swallow Current Status (W0981): At least 40 percent but less than  60 percent impaired, limited or restricted Swallow Goal Status [phone]): At least 40 percent but less than 60  percent impaired, limited or restricted Swallow Discharge Status [phone]): At least 40 percent but less  than 60 percent impaired, limited or restricted  Harlon Ditty, MA CCC-SLP [phone]  Claudine Mouton 08/14/2013, 1:48 PM    11:49 PM Lactic acid reported at 10.7, foley catheter changed with minimal urine output and purulent urine.  This is likely urosepsis (with one episode of  hypotension) but will cover with broad spectrum with vancomycin and zosyn.  Code sepsis, level 2 called.  CRITICAL CARE Performed by: Patrecia Pour. Total critical care time: 60 minutes Critical care time was exclusive of separately billable procedures and treating other patients. Critical care was necessary to treat or prevent imminent or life-threatening deterioration. Critical care was time spent personally by me on the following activities: development of treatment plan with patient and/or surrogate as well as nursing, discussions with consultants, evaluation of patient's response to treatment, examination of patient, obtaining history from patient or surrogate, ordering and performing treatments and interventions, ordering and review of laboratory studies, ordering and review of radiographic studies, pulse oximetry and re-evaluation of patient's condition.   MDM   1. Sepsis   2. Failure to thrive   3. Hypothermia, initial encounter     Patient presents from home after failure of home stay with acute renal failure, failure to thrive, acute urosepsis.  I have spoken with Dr. Dierdre Searles who will admit the patient to step down.  Code sepsis called and cultures drawn before abx given.  Dr. Fredderick Phenix directly involved in patient's care.  Critical care time in managing this complex patient and multiple medical problems with urosepsis.    Izola Price Marisue Humble, PA-C 09/09/13 2352

## 2013-09-09 NOTE — ED Notes (Signed)
Patient has indwelling foley catheter. No urine output noted in drainage bag. Foley clamped at this time to obtain urine sample

## 2013-09-09 NOTE — ED Notes (Signed)
Scarlette Calico, PA at bedside

## 2013-09-10 DIAGNOSIS — N401 Enlarged prostate with lower urinary tract symptoms: Secondary | ICD-10-CM

## 2013-09-10 DIAGNOSIS — N390 Urinary tract infection, site not specified: Secondary | ICD-10-CM

## 2013-09-10 DIAGNOSIS — Z5189 Encounter for other specified aftercare: Secondary | ICD-10-CM

## 2013-09-10 DIAGNOSIS — N179 Acute kidney failure, unspecified: Secondary | ICD-10-CM

## 2013-09-10 DIAGNOSIS — A419 Sepsis, unspecified organism: Secondary | ICD-10-CM

## 2013-09-10 LAB — TSH: TSH: 7.164 u[IU]/mL — ABNORMAL HIGH (ref 0.350–4.500)

## 2013-09-10 MED ORDER — ASPIRIN EC 81 MG PO TBEC
81.0000 mg | DELAYED_RELEASE_TABLET | Freq: Every day | ORAL | Status: DC
  Administered 2013-09-10 – 2013-09-13 (×3): 81 mg via ORAL
  Filled 2013-09-10 (×4): qty 1

## 2013-09-10 MED ORDER — SODIUM CHLORIDE 0.9 % IV BOLUS (SEPSIS)
250.0000 mL | Freq: Once | INTRAVENOUS | Status: AC
  Administered 2013-09-10: 250 mL via INTRAVENOUS

## 2013-09-10 MED ORDER — ONDANSETRON HCL 4 MG PO TABS: 4.0000 mg | ORAL_TABLET | Freq: Four times a day (QID) | ORAL | Status: DC | PRN

## 2013-09-10 MED ORDER — ONDANSETRON HCL 4 MG/2ML IJ SOLN: 4.0000 mg | Freq: Four times a day (QID) | INTRAMUSCULAR | Status: DC | PRN

## 2013-09-10 MED ORDER — HEPARIN SODIUM (PORCINE) 5000 UNIT/ML IJ SOLN
5000.0000 [IU] | Freq: Three times a day (TID) | INTRAMUSCULAR | Status: DC
  Administered 2013-09-10 – 2013-09-13 (×10): 5000 [IU] via SUBCUTANEOUS
  Filled 2013-09-10 (×13): qty 1

## 2013-09-10 MED ORDER — SODIUM CHLORIDE 0.9 % IJ SOLN
3.0000 mL | Freq: Two times a day (BID) | INTRAMUSCULAR | Status: DC
  Administered 2013-09-10 – 2013-09-12 (×3): 3 mL via INTRAVENOUS

## 2013-09-10 MED ORDER — FUROSEMIDE 10 MG/ML IJ SOLN
20.0000 mg | Freq: Two times a day (BID) | INTRAMUSCULAR | Status: AC
Start: 2013-09-10 — End: 2013-09-12
  Administered 2013-09-10 – 2013-09-11 (×2): 20 mg via INTRAVENOUS
  Filled 2013-09-10 (×3): qty 2

## 2013-09-10 MED ORDER — VANCOMYCIN HCL 500 MG IV SOLR
500.0000 mg | INTRAVENOUS | Status: DC
  Administered 2013-09-11: 500 mg via INTRAVENOUS
  Filled 2013-09-10: qty 500

## 2013-09-10 MED ORDER — PIPERACILLIN-TAZOBACTAM IN DEX 2-0.25 GM/50ML IV SOLN
2.2500 g | Freq: Three times a day (TID) | INTRAVENOUS | Status: DC
  Administered 2013-09-10 – 2013-09-12 (×6): 2.25 g via INTRAVENOUS
  Filled 2013-09-10 (×8): qty 50

## 2013-09-10 MED ORDER — DEXTROSE-NACL 5-0.9 % IV SOLN
INTRAVENOUS | Status: DC
  Administered 2013-09-10: 01:00:00 via INTRAVENOUS

## 2013-09-10 MED ORDER — ACETAMINOPHEN 325 MG PO TABS
650.0000 mg | ORAL_TABLET | Freq: Four times a day (QID) | ORAL | Status: DC | PRN
  Administered 2013-09-11: 650 mg via ORAL
  Filled 2013-09-10: qty 2

## 2013-09-10 MED ORDER — BOOST PO LIQD
237.0000 mL | Freq: Two times a day (BID) | ORAL | Status: DC
  Administered 2013-09-10 – 2013-09-13 (×4): 237 mL via ORAL
  Filled 2013-09-10 (×8): qty 237

## 2013-09-10 MED ORDER — MEGESTROL ACETATE 400 MG/10ML PO SUSP
400.0000 mg | Freq: Every day | ORAL | Status: DC
  Administered 2013-09-10 – 2013-09-13 (×3): 400 mg via ORAL
  Filled 2013-09-10 (×4): qty 10

## 2013-09-10 MED ORDER — SODIUM CHLORIDE 0.45 % IV SOLN
INTRAVENOUS | Status: DC
  Administered 2013-09-10: 75 mL via INTRAVENOUS
  Administered 2013-09-11 (×2): 1000 mL via INTRAVENOUS
  Administered 2013-09-12 – 2013-09-13 (×3): via INTRAVENOUS

## 2013-09-10 MED ORDER — SODIUM CHLORIDE 0.9 % IV BOLUS (SEPSIS)
500.0000 mL | Freq: Once | INTRAVENOUS | Status: AC
  Administered 2013-09-10: 500 mL via INTRAVENOUS

## 2013-09-10 MED ORDER — SENNOSIDES-DOCUSATE SODIUM 8.6-50 MG PO TABS
1.0000 | ORAL_TABLET | Freq: Every day | ORAL | Status: DC
  Administered 2013-09-10 – 2013-09-12 (×2): 1 via ORAL
  Filled 2013-09-10 (×4): qty 1

## 2013-09-10 NOTE — Progress Notes (Signed)
ANTIBIOTIC CONSULT NOTE - INITIAL  Pharmacy Consult for Vancomycin/Zosyn Indication: Urosepsis  No Known Allergies  Patient Measurements: Height: 5\' 11"  (180.3 cm) Weight: 104 lb 15 oz (47.6 kg) IBW/kg (Calculated) : 75.3   Vital Signs: Temp: 98.7 F (37.1 C) (10/19 0105) Temp src: Rectal (10/19 0105) BP: 101/58 mmHg (10/19 0105) Pulse Rate: 97 (10/19 0105) Intake/Output from previous day: 10/18 0701 - 10/19 0700 In: 500 [I.V.:500] Out: -  Intake/Output from this shift: Total I/O In: 500 [I.V.:500] Out: -   Labs:  Recent Labs  09/09/13 2002  WBC 8.8  HGB 11.6*  PLT 253  CREATININE 2.28*   Estimated Creatinine Clearance: 14.2 ml/min (by C-G formula based on Cr of 2.28). No results found for this basename: VANCOTROUGH, VANCOPEAK, VANCORANDOM, GENTTROUGH, GENTPEAK, GENTRANDOM, TOBRATROUGH, TOBRAPEAK, TOBRARND, AMIKACINPEAK, AMIKACINTROU, AMIKACIN,  in the last 72 hours   Microbiology: No results found for this or any previous visit (from the past 720 hour(s)).  Medical History: Past Medical History  Diagnosis Date  . Osteoarthrosis, unspecified whether generalized or localized, other specified sites   . Acute kidney failure with other specified pathological lesion in kidney   . Prostatitis   . Postsurgical percutaneous transluminal coronary angioplasty status     weintraub  . Cataract extraction status `  . Peripheral vascular disease, unspecified   . Personal history of malignant neoplasm of larynx   . Elevated prostate specific antigen (PSA)   . Gout, unspecified   . Acute peptic ulcer with hemorrhage   . Unspecified essential hypertension   . Hyperlipidemia   . Diastolic heart failure   . Pulmonary edema   . FTT (failure to thrive) in adult   . Cancer     throat    Medications:  Scheduled:  . aspirin EC  81 mg Oral Daily  . heparin  5,000 Units Subcutaneous Q8H  . lactose free nutrition  237 mL Oral BID BM  . megestrol  400 mg Oral Daily  .  senna-docusate  1 tablet Oral Daily  . sodium chloride  3 mL Intravenous Q12H   Infusions:  . dextrose 5 % and 0.9% NaCl 50 mL/hr at 09/10/13 0108   Assessment: 79 yo with hx of laryngeal Ca d/c'd yesterday for SNF.  Pt found to have purulent urine from indwelling catheter and hyperthermic T=95.7.  Zosyn and Vancomycin started for urosepsis.  Goal of Therapy:  Vancomycin trough level 15-20 mcg/ml  Plan:   Zosyn 2.25Gm q8h  Vancomycin 500mg  IV q48h  F/u Scr/levels/cultures as needed  Susanne Greenhouse R 09/10/2013,1:42 AM

## 2013-09-10 NOTE — ED Provider Notes (Signed)
Medical screening examination/treatment/procedure(s) were conducted as a shared visit with non-physician practitioner(s) and myself.  I personally evaluated the patient during the encounter Pt with altered MS, hypothermic on exam, tachycardia without hypotension.  Had low bicarb and elevated lactate.  Suspect sepsis, will give IVFs, broad spectrum abx, admit.  Rolan Bucco, MD 09/10/13 0002

## 2013-09-10 NOTE — Progress Notes (Signed)
Subjective: Wesley [name] has had a downhill course over the past several months. He had an admission with urinary retention 2/2 enalarged prostate and acute hydronephrosis with acute renal insufficeency. Due to the renal insufficiency he was tipped over into acute pulmonary edema in a setting of diastolic heart failure. He insisted on d/c home but returned in 24 hrs with recurrent fluid overload. After a 4 day admission he was d/c to SNF. By his report he has basically been bedbound at the SNF. He was d/c home where in less than 24 hrs he was brought back to ED for weakness, feeling cold and an inability to manage his ADLs. In the ED he was found to be hypothermic, had purulent drainage from his foley catheter and was readmitted with probably urosepsis.  At interview he awakens easily, recognizes this examiner and denies have much pain. He does report that he has been bed bound and unable to manage ADLs.    Objective: Lab:  Recent Labs  09/09/13 2002  WBC 8.8  NEUTROABS 7.5  HGB 11.6*  HCT 33.3*  MCV 93.3  PLT 253    Recent Labs  09/09/13 2002  NA 129*  K 4.1  CL 92*  GLUCOSE 153*  BUN 48*  CREATININE 2.28*  CALCIUM 9.9   pBNP 6364 ( 85) U/A - Large leukocytes, large Hgb WBC TNTC, few bacteria.  Imaging: CXR: IMPRESSION:  Emphysematous change without acute findings.   Scheduled Meds: . aspirin EC  81 mg Oral Daily  . heparin  5,000 Units Subcutaneous Q8H  . lactose free nutrition  237 mL Oral BID BM  . megestrol  400 mg Oral Daily  . piperacillin-tazobactam (ZOSYN)  IV  2.25 g Intravenous Q8H  . senna-docusate  1 tablet Oral Daily  . sodium chloride  3 mL Intravenous Q12H  . [START ON 09/11/2013] vancomycin  500 mg Intravenous Q48H   Continuous Infusions: . dextrose 5 % and 0.9% NaCl 50 mL/hr at 09/10/13 0108   PRN Meds:.acetaminophen, ondansetron (ZOFRAN) IV, ondansetron, ondansetron   Physical Exam: Filed Vitals:   09/10/13 1200  BP: 94/62  Pulse: 74  Temp:  97.6 F (36.4 C)  Resp: 14   Gen'l - cachectic AA man in no acute distress but somnolent. Not in pain HEENT- Edentulous, dentures in place; arcus senilis bilaterally, slightly muddy sclera, noticeable temporal wasting is noted and weight loss about the face. Neck - supple Nodes - negative Cor - 2+ radial pulse, RRR, no JVD, heart sounds distant but regular Pulm - no increased WOB, no wheezes or rales Abd - BS+, poor relaxation but no guarding or rebound Ext - very thin with interosseous wasting but no deformity Neuro - easily awakened, speech clear, recognizes examiner, knows he is at hospital and he is sick.      Assessment/Plan: 1. ID/UTI - patient with positive U/a with frank purulence reported from foley. WBC normal. Hypothermic at admission. Facility associated UTI with sepsis. Day #2 Vanc/zosyn  Plan Continue present antibiotics pending ss on urine culture  2. GU/Renal - at last discharge Cr 1.66 now 2.28 reflecting degree of CKD with exacerbation due to dehydration. He has chronic indwelling foley 2/2 very large prostate and not a candidate for TUR.  Plan Will gently hydrate  3. Cardiac - h/o diastolic dysfunction with previous CHF when initially obstructed. Last 2 D echo 8/11/30/12: Study Conclusions  - Procedure narrative: Transthoracic echocardiography. Image quality was poor. The study was technically difficult, as a result of poor  acoustic windows. - Left ventricle: Systolic function was normal. The estimated ejection fraction was in the range of 60% to 65%. Doppler parameters are consistent with abnormal left ventricular relaxation (grade 1 diastolic dysfunction). Doppler parameters are consistent with high ventricular filling pressure. - Pulmonary arteries: PA peak pressure: 41mm Hg (S).  He now presents with elevated BNP but is not acutely SOB and is oxygenating well  Plan IV lasix  4. Code Status - previously Mr. Tall had stated he did not was resuscitation,  mechanical ventilation and was uncertain about other heroic measures. This was revisited with him: he does not want CPR or intubation.  Dispo - unable to live on his own.    Illene Regulus [place] [facility] (o) [phone]; (c) [phone] Call-grp - [facility]  Tele: [phone]  09/10/2013, 12:46 PM

## 2013-09-10 NOTE — Progress Notes (Signed)
Clinical Social Work Department BRIEF PSYCHOSOCIAL ASSESSMENT 09/10/2013  Patient:  Wesley [name], Wesley [name]     Account Number:  [id number]     Admit date:  09/09/2013  Clinical Social Worker:  Doroteo Glassman  Date/Time:  09/10/2013 03:01 PM  Referred by:  Physician  Date Referred:  09/10/2013 Referred for  SNF Placement   Other Referral:   Interview type:  Patient Other interview type:    PSYCHOSOCIAL DATA Living Status:  FAMILY Admitted from facility:   Level of care:   Primary support name:  Reggie Christell Constant Primary support relationship to patient:  FAMILY Degree of support available:   adequate    CURRENT CONCERNS Current Concerns  Post-Acute Placement   Other Concerns:    SOCIAL WORK ASSESSMENT / PLAN Met with Pt to discuss d/c plans.    Pt appeared uncomfortable but was easily engageable.    Pt reported that he was at [facility] for a period of time but that he didn't like the care he received there.  Pt stated that he'd like to return to his grandson's care upon d/c but knows that his grandson cannot provide the care he needs.    Pt agreed to a SNF search.    CSW left SNF list in Pt's room.  Pt to review at a later time.    CSW thanked Pt for his time.   Assessment/plan status:  Psychosocial Support/Ongoing Assessment of Needs Other assessment/ plan:   Information/referral to community resources:   SNF list    PATIENT'S/FAMILY'S RESPONSE TO PLAN OF CARE: Pt's response to SNF was disappointment.  Pt would rather return to his grandson's care, however he know that this will not be possible.    Pt does not want to return to [facility]; he will consider another SNF.    Pt thanked CSW for time and assistance.   Providence Crosby, LCSWA Clinical Social Work [phone]

## 2013-09-10 NOTE — Progress Notes (Addendum)
Clinical Social Work Department CLINICAL SOCIAL WORK PLACEMENT NOTE 09/10/2013  Patient:  Wesley [name], Wesley [name]  Account Number:  [id number] Admit date:  09/09/2013  Clinical Social Worker:  Doroteo Glassman  Date/time:  09/10/2013 03:06 PM  Clinical Social Work is seeking post-discharge placement for this patient at the following level of care:   SKILLED NURSING   (*CSW will update this form in Epic as items are completed)   09/10/2013  Patient/family provided with [facility] Department of Clinical Social Work's list of facilities offering this level of care within the geographic area requested by the patient (or if unable, by the patient's family).  09/10/2013  Patient/family informed of their freedom to choose among providers that offer the needed level of care, that participate in Medicare, Medicaid or managed care program needed by the patient, have an available bed and are willing to accept the patient.  09/10/2013  Patient/family informed of [facility]' ownership interest in [facility], as well as of the fact that they are under no obligation to receive care at this facility.  PASARR submitted to EDS on 07/14/2013  PASARR number received from EDS on 07/14/2013   FL2 transmitted to all facilities in geographic area requested by pt/family on  09/10/2013 FL2 transmitted to all facilities within larger geographic area on   Patient informed that his/her managed care company has contracts with or will negotiate with  certain facilities, including the following:     Patient/family informed of bed offers received:  09/11/2013 Patient chooses bed at  Physician recommends and patient chooses bed at    Patient to be transferred to  on   Patient to be transferred to facility by   The following physician request were entered in Epic:   Additional Comments:  Providence Crosby, Theresia Majors Clinical Social Work [phone]

## 2013-09-11 ENCOUNTER — Ambulatory Visit: Payer: Medicare Other | Admitting: Internal Medicine

## 2013-09-11 ENCOUNTER — Inpatient Hospital Stay (HOSPITAL_COMMUNITY): Payer: Medicare Other

## 2013-09-11 DIAGNOSIS — N19 Unspecified kidney failure: Secondary | ICD-10-CM

## 2013-09-11 DIAGNOSIS — I10 Essential (primary) hypertension: Secondary | ICD-10-CM

## 2013-09-11 DIAGNOSIS — E43 Unspecified severe protein-calorie malnutrition: Secondary | ICD-10-CM | POA: Insufficient documentation

## 2013-09-11 LAB — BASIC METABOLIC PANEL
BUN: 38 mg/dL — ABNORMAL HIGH (ref 6–23)
Calcium: 8.5 mg/dL (ref 8.4–10.5)
Creatinine, Ser: 1.93 mg/dL — ABNORMAL HIGH (ref 0.50–1.35)
GFR calc non Af Amer: 29 mL/min — ABNORMAL LOW (ref >90)
Glucose, Bld: 84 mg/dL (ref 70–99)
Potassium: 3.1 mEq/L — ABNORMAL LOW (ref 3.5–5.1)
Sodium: 131 mEq/L — ABNORMAL LOW (ref 135–145)

## 2013-09-11 LAB — CBC WITH DIFFERENTIAL/PLATELET
Basophils Relative: 0 % (ref 0–1)
Eosinophils Absolute: 0 10*3/uL (ref 0.0–0.7)
Eosinophils Relative: 0 % (ref 0–5)
Hemoglobin: 9.3 g/dL — ABNORMAL LOW (ref 13.0–17.0)
Lymphs Abs: 0.9 10*3/uL (ref 0.7–4.0)
MCH: 32.9 pg (ref 26.0–34.0)
MCV: 93.3 fL (ref 78.0–100.0)
Monocytes Absolute: 0.4 10*3/uL (ref 0.1–1.0)
Monocytes Relative: 7 % (ref 3–12)
Platelets: 173 10*3/uL (ref 150–400)
RBC: 2.83 MIL/uL — ABNORMAL LOW (ref 4.22–5.81)

## 2013-09-11 MED ORDER — ADULT MULTIVITAMIN W/MINERALS CH
1.0000 | ORAL_TABLET | Freq: Every day | ORAL | Status: DC
  Administered 2013-09-11 – 2013-09-13 (×3): 1 via ORAL
  Filled 2013-09-11 (×3): qty 1

## 2013-09-11 MED ORDER — POTASSIUM CHLORIDE CRYS ER 20 MEQ PO TBCR
40.0000 meq | EXTENDED_RELEASE_TABLET | Freq: Two times a day (BID) | ORAL | Status: AC
  Administered 2013-09-11 (×2): 40 meq via ORAL
  Filled 2013-09-11 (×2): qty 2

## 2013-09-11 MED ORDER — STARCH (THICKENING) PO POWD
ORAL | Status: DC | PRN
  Filled 2013-09-11: qty 227

## 2013-09-11 NOTE — Evaluation (Signed)
Physical Therapy Evaluation Patient Details Name: Wesley [name] MRN: [id number] DOB: [birth year] Today's Date: 09/11/2013 Time: 7829-5621 PT Time Calculation (min): 13 min  PT Assessment / Plan / Recommendation History of Present Illness  Pt is an 79 y.o. male with hx of laryngeal cancer, PVD, CKD, diastolic CHF, FTT, CAD, just discharged home from SNF on 10/17 to the care of his grandson, brought back to the ER at [facility] tonight as his grandson felt he couldn't adequately take care of patient.  The patient was complaining of HA and shortness of breath, but he denied any those symptoms in the ER.  She has a indwelling catheter, and it was replaced, yielded purulent urine.  Pt reports history of falls.  Clinical Impression  Pt admitted for UTI/sepsis. Pt currently presenting with functional limitations due to deficits listed below (see PT Problem List). Pt limited in participation today due to extreme fatigue. Pt able to sit EOB with min-mod assist but unable to ambulate due to fatigue.  PT recommends SNF to improve endurance, strength, and safety; pt seems agreeable but states to speak with his grandson about SNF.  Pt would benefit from skilled PT to increase independence and safety during mobility and to allow discharge to venue listed below.    PT Assessment  Patient needs continued PT services    Follow Up Recommendations  SNF;Supervision/Assistance - 24 hour    Does the patient have the potential to tolerate intense rehabilitation      Barriers to Discharge        Equipment Recommendations  Other (comment) (speak with grandson to clarify AD pt has at home.)    Recommendations for Other Services     Frequency Min 3X/week    Precautions / Restrictions Precautions Precautions: Fall Restrictions Weight Bearing Restrictions: No   Pertinent Vitals/Pain No c/o pain at rest with increase RLE pain during bed mobility but unable to rate. Pt positioned to comfort at end of session.       Mobility  Bed Mobility Bed Mobility: Rolling Right;Right Sidelying to Sit;Sit to Sidelying Right;Sitting - Scoot to Edge of Bed Rolling Right: 4: Min assist Right Sidelying to Sit: 3: Mod assist;With rails Sitting - Scoot to Edge of Bed: 4: Min assist Sit to Sidelying Right: 3: Mod assist Details for Bed Mobility Assistance: min-mod A during bed mobility to assist LEs on/off EOB and guide trunk. PT utilized bed pad to guide hips during scooting to EOB. VC's for hand and LE placement. Transfers Transfers: Not assessed Details for Transfer Assistance: pt too fatigued to attempt transfers/ambulation today. Ambulation/Gait Ambulation/Gait Assistance: Not tested (comment)    Exercises     PT Diagnosis: Difficulty walking;Generalized weakness  PT Problem List: Decreased strength;Decreased range of motion;Decreased activity tolerance;Decreased balance;Decreased mobility;Decreased knowledge of use of DME PT Treatment Interventions: DME instruction;Gait training;Stair training;Functional mobility training;Therapeutic activities;Therapeutic exercise;Balance training;Neuromuscular re-education;Patient/family education (stair training if pt goes home, but PT recommends SNF.)     PT Goals(Current goals can be found in the care plan section) Acute Rehab PT Goals Patient Stated Goal: to get my legs stronger PT Goal Formulation: With patient Time For Goal Achievement: 09/25/13 Potential to Achieve Goals: Good  Visit Information  Last PT Received On: 09/11/13 Assistance Needed: +1 History of Present Illness: is an 79 y.o. male with hx of laryngeal cancer, PVD, CKD, diastolic CHF, FTT, CAD, just discharged home from SNF on 10/17 to the care of his grandson, brought back to the ER at [facility] as his grandson  felt he couldn't adequately take care of patient.  The patient was complaining of HA and shortness of breath, but he denied any those symptoms in the ER.  She has a indwelling catheter,  and it was replaced, yielded purulent urine.  Pt reports history of falls.       Prior Functioning  Home Living Family/patient expects to be discharged to:: Private residence Living Arrangements: Alone Available Help at Discharge: Family;Available 24 hours/day (grandson) Type of Home: Apartment Home Access: Stairs to enter [employer] of Steps: 1 Entrance Stairs-Rails: None Home Layout: One level Home Equipment: Walker - 2 wheels;Cane - single point;Bedside commode Additional Comments: Home equipment taken from history 1 month ago as pt reported to PT today that he was able to ambulated ind. and did not require AD or assistance for ADLs prior to hospitalization. Prior Function Level of Independence: Independent with assistive device(s) Comments: Pt reports falling multiple times prior to hospitalization but unable to state mechanism of fall. Communication Communication: No difficulties    Cognition  Cognition Arousal/Alertness: Lethargic (pt sleeping upon entering room, pt became more alert as session progress) Behavior During Therapy: WFL for tasks assessed/performed Overall Cognitive Status: Within Functional Limits for tasks assessed    Extremity/Trunk Assessment Lower Extremity Assessment Lower Extremity Assessment: Generalized weakness;LLE deficits/detail;RLE deficits/detail RLE Deficits / Details: pt reports pain in RLE during mobility but unable to rate. Grossly 2/5 strength with no reports of numbness tingling. LLE Deficits / Details: Grossly 2/5 strength with no reports of numbness tingling.   Balance Balance Balance Assessed: Yes Static Sitting Balance Static Sitting - Balance Support: Bilateral upper extremity supported;Feet supported Static Sitting - Level of Assistance: 5: Stand by assistance Static Sitting - Comment/# of Minutes: pt able to sit EOB for approx. 2 min. with bil UE support during during MMT with min guard to ensure safety.  Pt requested to  lie back down after MMT due to fatigue.  End of Session PT - End of Session Activity Tolerance: Patient limited by fatigue Patient left: in bed;with call bell/phone within reach;with bed alarm set Nurse Communication: Other (comment) (pt requested tomato juice)  GP     Sol Blazing 09/11/2013, 12:33 PM

## 2013-09-11 NOTE — Progress Notes (Signed)
Subjective: Awake with no complaints except for overall weakness. No pain.  Objective: Lab:  Recent Labs  09/09/13 2002 09/11/13 0335  WBC 8.8 6.3  NEUTROABS 7.5 5.0  HGB 11.6* 9.3*  HCT 33.3* 26.4*  MCV 93.3 93.3  PLT 253 173    Recent Labs  09/09/13 2002 09/11/13 0335  NA 129* 131*  K 4.1 3.1*  CL 92* 100  GLUCOSE 153* 84  BUN 48* 38*  CREATININE 2.28* 1.93*  CALCIUM 9.9 8.5  Venous Lactic acid 10.78 (10/18) BNP 6364(10/18) Blood Cxs 1:2 w/ GPC clusters  Imaging: 10/18 CXR: IMPRESSION:  Emphysematous change without acute findings.  Scheduled Meds: . aspirin EC  81 mg Oral Daily  . furosemide  20 mg Intravenous Q12H  . heparin  5,000 Units Subcutaneous Q8H  . lactose free nutrition  237 mL Oral BID BM  . megestrol  400 mg Oral Daily  . piperacillin-tazobactam (ZOSYN)  IV  2.25 g Intravenous Q8H  . senna-docusate  1 tablet Oral Daily  . sodium chloride  3 mL Intravenous Q12H  . vancomycin  500 mg Intravenous Q48H   Continuous Infusions: . sodium chloride 1,000 mL (09/11/13 0407)  . dextrose 5 % and 0.9% NaCl 50 mL/hr at 09/10/13 0108   PRN Meds:.acetaminophen, ondansetron (ZOFRAN) IV, ondansetron, ondansetron   Physical Exam: Filed Vitals:   09/11/13 0600  BP: 97/48  Pulse: 74  Temp:   Resp: 11    Intake/Output Summary (Last 24 hours) at 09/11/13 0710 Last data filed at 09/11/13 0656  Gross per 24 hour  Intake   1918 ml  Output   1025 ml  Net    893 ml   Total this adm; +1,756.8  gen'l- very thin man, temporal wasting noted HEENT- muddy sclera, arcus senilis, PERRLA Cor- quiet precordium, RRR Pulm - no labored respirations, no rales or wheeze Abd - scaphoid, BS+ Neuro - A&O x 3.    Assessment/Plan: 1. ID/UTI - Day # 3 Vanc/zosyn. No leukocytosis. Blood cx's 1:2 GPC clusters. Urine Cx positive - final report pending.  Plan Continue present antibiotic regimen and will adjust as culture data comes through  2. GU/Renal - Creatinine  improved. Low K noted Plan Oral replacement K+  3. Cardiac - BNP up but does not appear clinically or radiographically wet.  Dispo - transfer to med surg bed.  For SNF when medically stable.    Illene Regulus [place] [facility] (o) [phone]; (c) [phone] Call-grp - Patsi Sears IM  Tele: [phone]  09/11/2013, 7:06 AM

## 2013-09-11 NOTE — Progress Notes (Signed)
Patient transferred to room 1501.  Report called to [place], Charity fundraiser.  Patient traveled by bed, tolerated well.

## 2013-09-11 NOTE — Progress Notes (Signed)
OT Cancellation Note  Patient Details Name: Corwin Kuiken MRN: [id number] DOB: Jan 12, 1920   Cancelled Treatment:    Reason Eval/Treat Not Completed: Fatigue/lethargy limiting ability to participate after PT session. Will recheck back as able. Thanks,  Alba Cory 09/11/2013, 12:47 PM

## 2013-09-11 NOTE — Progress Notes (Signed)
Pt arrived from step down unit. Alert and oriented. Denies any pain. Sleeping. CXR done today. Family in visiting

## 2013-09-11 NOTE — Evaluation (Signed)
I have reviewed this note and agree with all findings. Kati Leola Fiore, PT, DPT Pager: [phone]   

## 2013-09-11 NOTE — Progress Notes (Signed)
INITIAL NUTRITION ASSESSMENT  Pt meets criteria for severe MALNUTRITION in the context of chronic illness as evidenced by 25% weight loss in the past 2 months in addition to pt with severe muscle wasting and subcutaneous fat loss throughout body.  DOCUMENTATION CODES Per approved criteria  -Severe malnutrition in the context of chronic illness -Underweight   INTERVENTION: - Downgrade diet to dysphagia 3, nectar thick was pt on nectar thick liquids recently at facility - Continue Boost Plus BID - Multivitamin 1 tablet PO daily - Recommend palliative care consult r/t pt's decline - Will continue to monitor   NUTRITION DIAGNOSIS: Inadequate oral intake related to lack of appetite as evidenced by 0% meal intake.   Goal: Pt to consume >90% of meals/supplements  Monitor:  Weights, labs, intake  Reason for Assessment: Nutrition risk   79 y.o. male  Admitting Dx: Sepsis  ASSESSMENT: Pt with hx of laryngeal cancer, PVD, CKD, diastolic CHF, FTT, CAD, just discharged home from SNF yesterday to the care of his grandson, brought back to the ER at [facility] tonight as his grandson felt he couldn't adequately take care of patient. Pt found to be hypothermic and had bear hugger placed.   Pt at [facility] 2 months ago and was seen by RD at that time who noted pt with poor intake, 25% of meals. Pt went from weighing 140 pounds then to 105 pounds now. Met with pt who reports not eating hardly anything recently r/t lack of appetite and knows he has been losing a significant amount of weight. Pt with severe muscle wasting and subcutaneous fat loss throughout body. Pt declined wanting anything to eat/drink.   Pt with low potassium and elevated total bilirubin. Elevated BUN/Cr trending down and GFR improving.   MD noted pt with downhill course x several months. Per SNF d/c summary, pt was on nectar thick liquids as well as Megace and Boost BID.     Height: Ht Readings from Last 1 Encounters:   09/10/13 5\' 11"  (1.803 m)    Weight: Wt Readings from Last 1 Encounters:  09/11/13 105 lb 13.1 oz (48 kg)    Ideal Body Weight: 172 lb  % Ideal Body Weight: 61%  Wt Readings from Last 10 Encounters:  09/11/13 105 lb 13.1 oz (48 kg)  09/08/13 112 lb (50.803 kg)  07/17/13 121 lb 4.1 oz (55 kg)  07/12/13 140 lb 10.5 oz (63.8 kg)  11/22/12 141 lb 0.6 oz (63.975 kg)  11/01/12 141 lb 12.8 oz (64.32 kg)  01/25/12 145 lb 2 oz (65.828 kg)  12/02/11 143 lb (64.864 kg)  03/12/11 149 lb (67.586 kg)  12/30/10 148 lb (67.132 kg)    Usual Body Weight: 140 lb 2 months ago  % Usual Body Weight: 75%  BMI:  Body mass index is 14.77 kg/(m^2). Underweight   Estimated Nutritional Needs: Kcal: 1700-1900 Protein: 85-100g Fluid: 1.7-1.9L/day   Skin: Stage 2 sacral pressure ulcer  Diet Order: Dysphagia  EDUCATION NEEDS: -No education needs identified at this time   Intake/Output Summary (Last 24 hours) at 09/11/13 1048 Last data filed at 09/11/13 0800  Gross per 24 hour  Intake 2022.5 ml  Output   1085 ml  Net  937.5 ml    Last BM: 10/19  Labs:   Recent Labs Lab 09/09/13 2002 09/11/13 0335  NA 129* 131*  K 4.1 3.1*  CL 92* 100  CO2 12* 18*  BUN 48* 38*  CREATININE 2.28* 1.93*  CALCIUM 9.9 8.5  GLUCOSE 153* 84    CBG (last 3)  No results found for this basename: GLUCAP,  in the last 72 hours  Scheduled Meds: . aspirin EC  81 mg Oral Daily  . furosemide  20 mg Intravenous Q12H  . heparin  5,000 Units Subcutaneous Q8H  . lactose free nutrition  237 mL Oral BID BM  . megestrol  400 mg Oral Daily  . piperacillin-tazobactam (ZOSYN)  IV  2.25 g Intravenous Q8H  . potassium chloride  40 mEq Oral BID  . senna-docusate  1 tablet Oral Daily  . sodium chloride  3 mL Intravenous Q12H  . vancomycin  500 mg Intravenous Q48H    Continuous Infusions: . sodium chloride 1,000 mL (09/11/13 0407)  . dextrose 5 % and 0.9% NaCl 50 mL/hr at 09/10/13 0108    Past Medical  History  Diagnosis Date  . Osteoarthrosis, unspecified whether generalized or localized, other specified sites   . Acute kidney failure with other specified pathological lesion in kidney   . Prostatitis   . Postsurgical percutaneous transluminal coronary angioplasty status     weintraub  . Cataract extraction status `  . Peripheral vascular disease, unspecified   . Personal history of malignant neoplasm of larynx   . Elevated prostate specific antigen (PSA)   . Gout, unspecified   . Acute peptic ulcer with hemorrhage   . Unspecified essential hypertension   . Hyperlipidemia   . Diastolic heart failure   . Pulmonary edema   . FTT (failure to thrive) in adult   . Cancer     throat    Past Surgical History  Procedure Laterality Date  . Percutaneous translumional coronary angioplastty      stents to bilateral legs for PVD  . Cataract extraction    . Truncal vagotomy  06/2005  . Gastrojejunosotomy  06/2005  . Cholecystectomy  06/2005    Levon Hedger MS, RD, LDN [phone] Pager [phone] After Hours Pager

## 2013-09-11 NOTE — Progress Notes (Signed)
[employer] reported patients anaerobic bottle of blood cultures are gram + cocci/clusters. Made on call hospitalist aware Dr reidler. No additional orders given at this time. She will pass his information to morning team

## 2013-09-11 NOTE — Progress Notes (Signed)
CSW met with pt at bedside and provided SNF bed offers.  CSW provided SNF bed offers at bedside and pt was resting and asked CSW to contact pt grandson.  CSW contacted pt grandson, Reggie, to discuss.   Per pt grandson, pt grandson would be interested in [facility] as he has built a Therapist, nutritional with staff at facility and it is close to where he lives, but knows that pt may be hesitant to this and states that he recognizes that it will be pt decision.   CSW encourage pt grandson to discuss with pt and stated that bed offers were provided at bedside and unit CSW can follow up with pt and pt grandson to assist with disposition planning.  Pt grandson plans to discuss with pt and appreciative of CSW assistance.  CSW provided report to 5E CSW, Becky Sax who will continue to follow and assist with pt d/c planning needs.  Jacklynn Lewis, MSW, LCSWA  Clinical Social Work

## 2013-09-12 DIAGNOSIS — E43 Unspecified severe protein-calorie malnutrition: Secondary | ICD-10-CM

## 2013-09-12 DIAGNOSIS — R634 Abnormal weight loss: Secondary | ICD-10-CM

## 2013-09-12 LAB — URINE CULTURE: Colony Count: >=100000

## 2013-09-12 LAB — BASIC METABOLIC PANEL
BUN: 28 mg/dL — ABNORMAL HIGH (ref 6–23)
CO2: 17 mEq/L — ABNORMAL LOW (ref 19–32)
Calcium: 8.4 mg/dL (ref 8.4–10.5)
Chloride: 103 mEq/L (ref 96–112)
Creatinine, Ser: 1.52 mg/dL — ABNORMAL HIGH (ref 0.50–1.35)
GFR calc Af Amer: 44 mL/min — ABNORMAL LOW (ref >90)
GFR calc non Af Amer: 38 mL/min — ABNORMAL LOW (ref >90)
Glucose, Bld: 79 mg/dL (ref 70–99)

## 2013-09-12 LAB — CULTURE, BLOOD (ROUTINE X 2)

## 2013-09-12 MED ORDER — SULFAMETHOXAZOLE-TMP DS 800-160 MG PO TABS
1.0000 | ORAL_TABLET | Freq: Two times a day (BID) | ORAL | Status: DC
  Administered 2013-09-12 – 2013-09-13 (×3): 1 via ORAL
  Filled 2013-09-12 (×5): qty 1

## 2013-09-12 NOTE — Evaluation (Signed)
Occupational Therapy Evaluation Patient Details Name: Wesley [name] MRN: [id number] DOB: 1920-03-28 Today's Date: 09/12/2013 Time: 7829-5621 OT Time Calculation (min): 16 min  OT Assessment / Plan / Recommendation History of present illness is an 79 y.o. male with hx of laryngeal cancer, PVD, CKD, diastolic CHF, FTT, CAD, just discharged home from SNF on 10/17 to the care of his grandson,and his grandson felt he couldn't adequately take care of patient.   Pt had HA and SOB when he returned to ED.    he has a indwelling catheter Pt reports history of falls.   Clinical Impression      OT Assessment  Patient needs continued OT Services    Follow Up Recommendations  SNF    Barriers to Discharge      Equipment Recommendations   (defer to snf--likely 3:1 if he doesn't have this)    Recommendations for Other Services    Frequency  Min 2X/week    Precautions / Restrictions Precautions Precautions: Fall Restrictions Weight Bearing Restrictions: No   Pertinent Vitals/Pain No pain at rest.  Grimaced during UE assessment and bed mobility; modified activity as able.  Pt denied pain when returned to supine    ADL  Grooming: Wash/dry hands;Wash/dry face;Set up Where Assessed - Grooming: Supine, head of bed up Transfers/Ambulation Related to ADLs: pt sat eob x 4 minutes.  Pt with poor activity tolerance. ADL Comments: adls limited by endurance and general weakness.  UB adls overall max A and total A for LB.  Pt did not have the endurance to stand--would perform eob to supine with roll.    OT Diagnosis: Generalized weakness  OT Problem List: Decreased strength;Decreased activity tolerance;Decreased knowledge of use of DME or AE;Impaired UE functional use;Pain OT Treatment Interventions: Self-care/ADL training;DME and/or AE instruction;Energy conservation;Therapeutic exercise;Therapeutic activities;Patient/family education   OT Goals(Current goals can be found in the care plan section) Acute  Rehab OT Goals Patient Stated Goal: "I don't know." OT Goal Formulation: Patient unable to participate in goal setting Time For Goal Achievement: 09/26/13 Potential to Achieve Goals: Fair ADL Goals Pt Will Perform Upper Body Bathing: with mod assist;sitting;bed level Pt Will Transfer to Toilet: with mod assist;stand pivot transfer;bedside commode Additional ADL Goal #1: pt will sit unsupported eob x 8 minutes to increase strength and endurance for adls Additional ADL Goal #2: pt will participate in 10 reps x 2 movements of bil shoulder AAROM to increase strength for adls  Visit Information  Last OT Received On: 09/12/13 Assistance Needed: +1 (for )        Prior Functioning     Home Living Family/patient expects to be discharged to:: Skilled nursing facility Additional Comments: was recently d/c'd from snf to grandson's.  Per chart, grandson can not manage care Prior Function Level of Independence: Needs assistance Comments: pt is a poor historian; likely needed assistance with adls.  Poor endurance Communication Communication: No difficulties         Vision/Perception     Cognition  Cognition Arousal/Alertness: Awake/alert Behavior During Therapy: WFL for tasks assessed/performed Overall Cognitive Status: No family/caregiver present to determine baseline cognitive functioning Memory: Decreased short-term memory    Extremity/Trunk Assessment Upper Extremity Assessment Upper Extremity Assessment: RUE deficits/detail RUE Deficits / Details: elbows to fingers wfls; arom.  Strength 3/5 x grip 3+ and shoulders 2-/5  AAROM to shoulders about 90 degrees; but had some pain RUE Coordination:  (has bil intention tremor) LUE Deficits / Details: elbow to fingers arom wfls;strength grossly 3/5; grip 3+/5.  Shoulder 2-/5  AAROM to shoulder approximately 50 degrees painful     Mobility Bed Mobility Rolling Right: 4: Min assist Right Sidelying to Sit: 3: Mod assist;With  rails Sitting - Scoot to Edge of Bed: 3: Mod assist Sit to Sidelying Right: 3: Mod assist Details for Bed Mobility Assistance: assist for legs and trunk.  Pt used rail to push up with  Transfers Transfers: Not assessed     Exercise Other Exercises Other Exercises: 5 reps of shoulder aarom to each arm within pain tolerance   Balance Static Sitting Balance Static Sitting - Balance Support: Bilateral upper extremity supported;Feet supported Static Sitting - Level of Assistance: 5: Stand by assistance Static Sitting - Comment/# of Minutes: 4   End of Session OT - End of Session Activity Tolerance: Patient tolerated treatment well Patient left: in bed;with call bell/phone within reach;with bed alarm set  GO     Erabella Kuipers 09/12/2013, 9:26 AM Marica Otter, OTR/L [phone] 09/12/2013

## 2013-09-12 NOTE — Progress Notes (Signed)
Subjective: Wesley [name] is awake and alert and has no complaints  Objective: Lab:  Recent Labs  09/09/13 2002 09/11/13 0335  WBC 8.8 6.3  NEUTROABS 7.5 5.0  HGB 11.6* 9.3*  HCT 33.3* 26.4*  MCV 93.3 93.3  PLT 253 173    Recent Labs  09/09/13 2002 09/11/13 0335 09/12/13 0601  NA 129* 131* 132*  K 4.1 3.1* 4.6  CL 92* 100 103  GLUCOSE 153* 84 79  BUN 48* 38* 28*  CREATININE 2.28* 1.93* 1.52*  CALCIUM 9.9 8.5 8.4   Urine culture: e. Coli - pan sensitive Blood Cxs 1:2 GPC clusters  Imaging: 09/11/13 CXR: IMPRESSION:  Mild patchy opacity at the right lung base, likely chronic.  Superimposed infection is not entirely excluded but is considered  unlikely.  Scheduled Meds: . aspirin EC  81 mg Oral Daily  . furosemide  20 mg Intravenous Q12H  . heparin  5,000 Units Subcutaneous Q8H  . lactose free nutrition  237 mL Oral BID BM  . megestrol  400 mg Oral Daily  . multivitamin with minerals  1 tablet Oral Daily  . piperacillin-tazobactam (ZOSYN)  IV  2.25 g Intravenous Q8H  . senna-docusate  1 tablet Oral Daily  . sodium chloride  3 mL Intravenous Q12H  . vancomycin  500 mg Intravenous Q48H   Continuous Infusions: . sodium chloride 75 mL/hr at 09/12/13 0422  . dextrose 5 % and 0.9% NaCl 50 mL/hr at 09/10/13 0108   PRN Meds:.acetaminophen, food thickener, ondansetron (ZOFRAN) IV, ondansetron, ondansetron   Physical Exam: Filed Vitals:   09/11/13 2210  BP: 92/61  Pulse: 89  Temp: 98.4 F (36.9 C)  Resp: 16    Intake/Output Summary (Last 24 hours) at 09/12/13 0731 Last data filed at 09/11/13 1921  Gross per 24 hour  Intake   1055 ml  Output    320 ml  Net    735 ml   Gen'l - cachetic elderly man in no distress HEENT- temporal wasting, edentulous with full dentures that are loose Cor - 2+ radial, RRR PUlm - normal breath sounds, no increased WOB Abd - scaphoid, BS _ Neuro - non focal exam.     Assessment/Plan: 1. ID/UTI - culture e. Coli pan  sensitive Plan D/c Vanc and zosyn  Start Septra DS bid  2. GU/Renal - chronic foley due to BOO 2/2 large prostate. Creatinine improved with hydration Plan Continue IV fluids  3. Cardiac - no c/p. No clinical evidence of fluid overload.  4. Protein-calorie malnutrition - cachetic. He reports poor appetite. Is on Megace and supplements  Plan continue present regimen  Dispo - for SNF, should be ready medically 09/13/13     Illene Regulus [facility] (o) [phone]; (c) [phone] Call-grp - [facility]  Tele: [phone]  09/12/2013, 7:26 AM

## 2013-09-13 LAB — BASIC METABOLIC PANEL
BUN: 22 mg/dL (ref 6–23)
Calcium: 9 mg/dL (ref 8.4–10.5)
Creatinine, Ser: 1.52 mg/dL — ABNORMAL HIGH (ref 0.50–1.35)
GFR calc Af Amer: 44 mL/min — ABNORMAL LOW (ref >90)

## 2013-09-13 MED ORDER — STARCH (THICKENING) PO POWD: 5.0000 g | Freq: Every day | ORAL | Status: DC

## 2013-09-13 NOTE — Discharge Summary (Signed)
NAMEJOSEANGEL, Wesley [name] NO.:  [id number]  MEDICAL RECORD NO.:  [id number]  LOCATION:  1501                         FACILITY:  [facility]  PHYSICIAN:  Rosalyn Gess. Norins, MD  DATE OF BIRTH:  1920/11/22  DATE OF ADMISSION:  09/09/2013 DATE OF DISCHARGE:  09/13/2013                              DISCHARGE SUMMARY   ADMITTING DIAGNOSES: 1. Urinary tract infection with sepsis. 2. Hepatic tremor. 3. Peripheral vascular disease. 4. History of renal failure. 5. Benign prostatic hypertrophy with bladder outlet obstruction. 6. Failure to thrive.  DISCHARGE DIAGNOSES: 1. Urinary tract infection with sepsis. 2. Hepatic tremor. 3. Peripheral vascular disease. 4. History of renal failure. 5. Benign prostatic hypertrophy with bladder outlet obstruction. 6. Failure to thrive.  CONSULTANTS:  None.  PROCEDURES: 1. Imaging September 09, 2013, two-view chest, which showed     emphysematous changes without acute findings. 2. Chest x-ray October, 20, which showed mild patchy opacity at right     lung base likely chronic.  Superimposed infection cannot be     entirely excluded.    Of note, the patient had a swallowing study     August 14, 2013, which showed the patient has some dysphagia and     recommended that he have aspiration precautions and nectar thick     liquids.  HISTORY OF PRESENT ILLNESS:  Wesley [name] is a 79 year old gentleman with a history of laryngeal cancer, peripheral vascular disease, chronic kidney disease, diastolic heart failure, coronary artery disease.  He has had a series of hospitalizations.  He did have a bladder outlet obstruction secondary to a massive prostate gland which led to acute renal insufficiency and failure.  He had a permanent indwelling Foley catheter placed and had significant improvement in his renal function.  The patient also had improvement in his heart failure.  The patient was discharged from hospital to home, lasted 24 hours and  was readmitted for acute exacerbation of his underlying problems.  He did well at hospitalization and was discharged to skilled care facility.  The patient was discharged from skilled care, was home for 24 hours and presented to the emergency department with headache, shortness of breath.  He had purulent urine in his Foley catheter, and was found to be hypothermic at 95 degrees.  He did have an elevated lactic acid level of 10, bicarb was 12, sodium was 129.  Creatinine was 2.8.  The patient was admitted for urinary tract infection and sepsis.  Please see the H and P for past medical history, family history, social history, and admission examination.  HOSPITAL COURSE: 1. The patient was started on IV antibiotics with vancomycin and     Zosyn.  Urine cultures were obtained.  This came back as E. coli,     pansensitive.  The patient was switched to Septra DS b.i.d. on the     21st.  The patient's urine has remained clear.  His Foley catheter     has been flowing normally.  At this point, the patient seeming to     respond to antibiotics, with no evidence of significant sepsis, he     is ready for transfer to a  skilled care facility and will complete     outpatient oral antibiotics.  2. Urology/renal.  The patient does have massive enlargement of the     prostate with bladder outlet obstruction.  He does require an     indwelling Foley catheter, with routine catheter care with flushing     daily or every other day, and changing catheter monthly.  With this     admission with the patient being adequately hydrated, his     creatinine did improve.  The final creatinine on the 22nd, day     of discharge, was 1.52 which is at the patient's baseline.  3. Cardiac.  The patient has remained stable.  He has no sign of     decompensation or heart failure.  No fluid overload.  4. Protein-calorie malnutrition.  The patient is cachectic.  He has a     very poor appetite.  He is already taking  Megace and supplements     and this should be continued as an outpatient.  5. Code status.  The patient has made a decision that he would not     want heroic measures and therefore is not a candidate for cardiac     resuscitation (DNR) or mechanical ventilation (DNI), nor would he     be a candidate for heroic or aggressive care in terms of long-term     ICU care.  DISCHARGE EXAMINATION:  VITAL SIGNS:  Temperature was 97.6, blood pressure 101/64, pulse 84, respirations 18, O2 sats 98% on room air. GENERAL:  The patient is sitting in a Geri chair.  Awake and alert. HEENT:  The patient has significant temporal wasting.  He has very loose fitting dentures.  Conjunctiva and sclerae were clear. NECK:  Supple. PULMONARY:  The patient is moving air well with no rales, wheezes, or rhonchi.  No increased work of breathing. CARDIOVASCULAR:  2+ radial pulses.  Precordium was quiet.  His heart sounds were distant but did seem regular. ABDOMEN:  Scaphoid.  No guarding or rebound was noted.  He has no organosplenomegaly. GENITALIA:  The patient has an indwelling Foley catheter.  There is no discharge around the meatus. EXTREMITIES:  Very thin, but no actual deformities. DERM:  The patient has no sign of skin breakdown on upper extremities, lower extremities. SACRUM:  Not examined. NEURO:  The patient is awake and alert.  He is oriented to person, place, time, and context.  FINAL LABORATORY:  Metabolic panel the day of discharge with a sodium of 132, potassium 4.6, chloride 103, CO2 of 17, BUN of 22, creatinine 1.52, glucose 11.  Final CBC from October 20, with a white count of 6300, hemoglobin 9.3 g which is chronic.  Platelet count 173,000.  TSH from September 09, 2013, was minimally elevated at 7.164.  DISCHARGE MEDICATIONS: 1. Tylenol 325 mg 2 tablets every 6 hours as needed for pain or     discomfort. 2. Amlodipine 5 mg daily. 3. Aspirin 81 mg daily. 4. Food thickener to nectar thick  consistency on clear liquids. 5. Furosemide 40 mg daily. 6. Lactose-free boost 2 times daily between meals. 7. Megace 400 mg per 10 mL given 10 mL daily for appetite. 8. Metoprolol 50 mg once daily. 9. Zofran 4 mg, 1 p.o. q.6h p.r.n. nausea. 10.Potassium 10 mEq 2 tablets daily. 11.Senokot S one tab by mouth at bedtime.  DISPOSITION:  The patient is discharged to skilled care facility.  He may follow all facility protocols.  Does need  a PT and OT consultation and treatment.  The patient may have leave of absence with family.  CODE STATUS:  DNR.  We are glad to see the patient as needed as an outpatient at the office and definitely if the patient is readmitted to the hospital should be admitted to Dr. Izora Ribas Service.  The patient's condition at time of discharge dictation is stable but guarded due to his multiple medical problems and his advanced age.     Rosalyn Gess Norins, MD     MEN/MEDQ  D:  09/13/2013  T:  09/13/2013  Job:  [id number]

## 2013-09-13 NOTE — Progress Notes (Signed)
CSW met with patient. Patient is alert and oriented X3. Patient defers to grandson for snf choice. CSW called patient's grandson, Reggie, he states that he would like patient to go to [facility].  Akiem Urieta C. Tyris Eliot MSW, LCSW [phone]

## 2013-09-13 NOTE — Progress Notes (Signed)
Report called to [facility] at [facility].  Maddock Finigan RN 

## 2013-09-13 NOTE — Progress Notes (Signed)
Physical Therapy Treatment Patient Details Name: Torrey Ballinas MRN: [id number] DOB: 09/04/1920 Today's Date: 09/13/2013 Time: 0865-7846 PT Time Calculation (min): 23 min  PT Assessment / Plan / Recommendation  History of Present Illness is an 79 y.o. male with hx of laryngeal cancer, PVD, CKD, diastolic CHF, FTT, CAD, just discharged home from SNF on 10/17 to the care of his grandson, brought back to the ER at [facility] tonight as his grandson felt he couldn't adequately take care of patient.  The patient was complaining of HA and shortness of breath, but he denied any those symptoms in the ER.  She has a indwelling catheter, and it was replaced, yielded purulent urine.  Pt reports history of falls.   PT Comments   Pt required increased encouragement to participate however ambulated with +2 assist 7 feet with RW and performed a few exercises in recliner.  Pt presents with increased generalized weakness and limited endurance.   Follow Up Recommendations  SNF;Supervision/Assistance - 24 hour     Does the patient have the potential to tolerate intense rehabilitation     Barriers to Discharge        Equipment Recommendations  None recommended by PT (defer to SNF)    Recommendations for Other Services    Frequency Min 3X/week   Progress towards PT Goals Progress towards PT goals: Progressing toward goals  Plan Current plan remains appropriate    Precautions / Restrictions Precautions Precautions: Fall Restrictions Weight Bearing Restrictions: No   Pertinent Vitals/Pain No c/o pain during session, repositioned in recliner to comfort    Mobility  Bed Mobility Bed Mobility: Supine to Sit;Sitting - Scoot to Edge of Bed Supine to Sit: 3: Mod assist;With rails Sitting - Scoot to Edge of Bed: 3: Mod assist Details for Bed Mobility Assistance: assist for LEs over bed, verbal cues to use rail and provided HHA to pull trunk upright, bed pad utilized for scoot to EOB Transfers Transfers: Sit to  Stand;Stand to Sit Sit to Stand: From bed;1: +2 Total assist Sit to Stand: Patient Percentage: 50% Stand to Sit: To chair/3-in-1;1: +2 Total assist Stand to Sit: Patient Percentage: 50% Details for Transfer Assistance: verbal cues for safe technique, increased encouragement required to stand and ambulate today, sit <-> stands performed x2 (once for hygiene and pt quickly fatigued resting to sit and take rest break before ambulation) Ambulation/Gait Ambulation/Gait Assistance: 1: +2 Total assist Ambulation/Gait: Patient Percentage: 50% Ambulation Distance (Feet): 7 Feet Assistive device: Rolling walker Ambulation/Gait Assistance Details: assist required for weakness, decreased distance due to weakness and fatigue, recliner pulled up behind pt to sit Gait Pattern: Step-through pattern;Trunk flexed;Decreased stride length    Exercises General Exercises - Lower Extremity Ankle Circles/Pumps: AROM;Both;20 reps;Supine Quad Sets: AROM;Both;15 reps;Supine Hip ABduction/ADduction: AROM;Both;10 reps;Supine;Limitations Hip Abduction/Adduction Limitations: limited AROM   PT Diagnosis:    PT Problem List:   PT Treatment Interventions:     PT Goals (current goals can now be found in the care plan section)    Visit Information  Last PT Received On: 09/13/13 Assistance Needed: +2 History of Present Illness: is an 79 y.o. male with hx of laryngeal cancer, PVD, CKD, diastolic CHF, FTT, CAD, just discharged home from SNF on 10/17 to the care of his grandson, brought back to the ER at [facility] tonight as his grandson felt he couldn't adequately take care of patient.  The patient was complaining of HA and shortness of breath, but he denied any those symptoms in the ER.  She  has a indwelling catheter, and it was replaced, yielded purulent urine.  Pt reports history of falls.    Subjective Data      Cognition  Cognition Arousal/Alertness: Awake/alert Behavior During Therapy: WFL for tasks  assessed/performed Overall Cognitive Status: Impaired/Different from baseline Area of Impairment: Orientation Orientation Level: Place;Situation    Balance     End of Session PT - End of Session Equipment Utilized During Treatment: Gait belt Activity Tolerance: Patient limited by fatigue Patient left: in chair;with call bell/phone within reach;with family/visitor present   GP     Marky Buresh,KATHrine E 09/13/2013, 11:58 AM Zenovia Jarred, PT, DPT 09/13/2013 Pager: [phone]

## 2013-09-13 NOTE — Clinical Social Work Placement (Signed)
     Clinical Social Work Department CLINICAL SOCIAL WORK PLACEMENT NOTE 09/13/2013  Patient:  Wesley [name], Wesley [name]  Account Number:  [id number] Admit date:  09/09/2013  Clinical Social Worker:  Doroteo Glassman  Date/time:  09/10/2013 03:06 PM  Clinical Social Work is seeking post-discharge placement for this patient at the following level of care:   SKILLED NURSING   (*CSW will update this form in Epic as items are completed)   09/10/2013  Patient/family provided with [facility] Department of Clinical Social Works list of facilities offering this level of care within the geographic area requested by the patient (or if unable, by the patients family).  09/10/2013  Patient/family informed of their freedom to choose among providers that offer the needed level of care, that participate in Medicare, Medicaid or managed care program needed by the patient, have an available bed and are willing to accept the patient.  09/10/2013  Patient/family informed of [facility] ownership interest in [facility], as well as of the fact that they are under no obligation to receive care at this facility.  PASARR submitted to EDS on 09/13/2013 PASARR number received from EDS on 09/13/2013  FL2 transmitted to all facilities in geographic area requested by pt/family on  09/10/2013 FL2 transmitted to all facilities within larger geographic area on   Patient informed that his/her managed care company has contracts with or will negotiate with  certain facilities, including the following:     Patient/family informed of bed offers received:  09/13/2013 Patient chooses bed at [facility] Physician recommends and patient chooses bed at    Patient to be transferred to [facility] on  09/13/2013 Patient to be transferred to facility by [facility]tar  The following physician request were entered in Epic:   Additional Comments:

## 2013-09-13 NOTE — Progress Notes (Signed)
Patient cleared for discharge. Packet copied and given to RN. Patient's grandson aware of discharge and understands no guarantee of transport. Grandson expressed gratitude of CSW's assistance. [facility] called for transport.  Moksha Dorgan C. Keyona Emrich MSW, LCSW [phone]

## 2013-09-13 NOTE — Progress Notes (Signed)
For d/c to SNF  Priority dictation  # [id number]

## 2013-09-15 ENCOUNTER — Non-Acute Institutional Stay (SKILLED_NURSING_FACILITY): Payer: Medicare Other | Admitting: Nurse Practitioner

## 2013-09-15 DIAGNOSIS — N401 Enlarged prostate with lower urinary tract symptoms: Secondary | ICD-10-CM

## 2013-09-15 DIAGNOSIS — N19 Unspecified kidney failure: Secondary | ICD-10-CM

## 2013-09-15 DIAGNOSIS — I10 Essential (primary) hypertension: Secondary | ICD-10-CM

## 2013-09-15 DIAGNOSIS — N138 Other obstructive and reflux uropathy: Secondary | ICD-10-CM

## 2013-09-15 DIAGNOSIS — I509 Heart failure, unspecified: Secondary | ICD-10-CM

## 2013-09-15 DIAGNOSIS — E43 Unspecified severe protein-calorie malnutrition: Secondary | ICD-10-CM

## 2013-09-15 DIAGNOSIS — N390 Urinary tract infection, site not specified: Secondary | ICD-10-CM

## 2013-09-15 NOTE — Progress Notes (Signed)
Patient ID: Wesley [name], male   DOB: [birth year], 79 y.o.   MRN: [id number]   PCP: Wesley Regulus, MD   No Known Allergies  Chief Complaint  Patient presents with  . Hospitalization Follow-up    HPI:  Wesley [name] is a 79 year old gentleman with a history of laryngeal cancer, peripheral vascular disease, chronic kidney  disease, diastolic heart failure, coronary artery disease, bladder outlet obstruction secondary to a massive prostate gland which led to acute renal insufficiency and failure and now with  permanent indwelling Foley catheter placed. Pt was previously at [facility] after hospitalization and went home and after 24 hours was readmitted with urinary tract infection and sepsis.  Pt remained stable and was discharged back to [facility]. Pt without complaints today but staff is concerned due to pts weight loss and lack of appetite. Pt currently on megace.   Review of Systems:  Review of Systems  Constitutional: Positive for weight loss. Negative for fever, chills and malaise/fatigue.  Respiratory: Negative for cough and shortness of breath.   Cardiovascular: Negative for chest pain and leg swelling.  Gastrointestinal: Negative for heartburn and abdominal pain.  Genitourinary: Negative for dysuria.       Has foley  Musculoskeletal: Negative for myalgias.  Skin: Negative.   Neurological: Negative for headaches.  Psychiatric/Behavioral: Negative for depression. The patient does not have insomnia.      Past Medical History  Diagnosis Date  . Osteoarthrosis, unspecified whether generalized or localized, other specified sites   . Acute kidney failure with other specified pathological lesion in kidney   . Prostatitis   . Postsurgical percutaneous transluminal coronary angioplasty status     weintraub  . Cataract extraction status `  . Peripheral vascular disease, unspecified   . Personal history of malignant neoplasm of larynx   . Elevated prostate specific antigen (PSA)   .  Gout, unspecified   . Acute peptic ulcer with hemorrhage   . Unspecified essential hypertension   . Hyperlipidemia   . Diastolic heart failure   . Pulmonary edema   . FTT (failure to thrive) in adult   . Cancer     throat   Past Surgical History  Procedure Laterality Date  . Percutaneous translumional coronary angioplastty      stents to bilateral legs for PVD  . Cataract extraction    . Truncal vagotomy  06/2005  . Gastrojejunosotomy  06/2005  . Cholecystectomy  06/2005   Social History:   reports that he has been smoking Cigarettes and Cigars.  He has been smoking about 0.50 packs per day. He does not have any smokeless tobacco history on file. He reports that he drinks alcohol. He reports that he does not use illicit drugs.  Family History  Problem Relation Age of Onset  . Heart disease Father   . Prostate cancer Brother   . Heart disease Brother     Medications: Patient's Medications  New Prescriptions   No medications on file  Previous Medications   ACETAMINOPHEN (TYLENOL) 325 MG TABLET    Take 2 tablets (650 mg total) by mouth every 6 (six) hours as needed for pain.   AMLODIPINE (NORVASC) 5 MG TABLET    Take 5 mg by mouth daily.   ASPIRIN EC 81 MG TABLET    Take 81 mg by mouth daily.   FOOD THICKENER (THICK IT) POWD    Take 5 g by mouth daily.   FUROSEMIDE (LASIX) 40 MG TABLET    Take 1 tablet (40 mg  total) by mouth daily.   LACTOSE FREE NUTRITION (BOOST) LIQD    Take 237 mLs by mouth 2 (two) times daily between meals.   MEGESTROL (MEGACE) 400 MG/10ML SUSPENSION    Take 10 mLs (400 mg total) by mouth daily.   METOPROLOL (LOPRESSOR) 50 MG TABLET    Take 50 mg by mouth daily.   ONDANSETRON (ZOFRAN) 4 MG TABLET    Take 1 tablet (4 mg total) by mouth every 6 (six) hours as needed for nausea.   POTASSIUM CHLORIDE (K-DUR,KLOR-CON) 10 MEQ TABLET    Take 2 tablets (20 mEq total) by mouth daily.   SENNOSIDES-DOCUSATE SODIUM (SENOKOT-S) 8.6-50 MG TABLET    Take 1 tablet by  mouth daily.  Modified Medications   No medications on file  Discontinued Medications   No medications on file     Physical Exam:  Filed Vitals:   09/15/13 1011  BP: 118/58  Pulse: 82  Temp: 97.9 F (36.6 C)  Resp: 16  Weight: 105 lb (47.628 kg)   Physical Exam  Constitutional: No distress.  Thin AA male in NAD  HENT:  Head: Normocephalic and atraumatic.  Mouth/Throat: Oropharynx is clear and moist. No oropharyngeal exudate.  Eyes: Conjunctivae and EOM are normal. Pupils are equal, round, and reactive to light.  Neck: Normal range of motion. Neck supple. No thyromegaly present.  Cardiovascular: Normal rate, regular rhythm and normal heart sounds.   Pulmonary/Chest: Effort normal and breath sounds normal. No respiratory distress.  Abdominal: Soft. Bowel sounds are normal. He exhibits no distension.  Genitourinary:  With foley  Musculoskeletal: He exhibits no edema and no tenderness.  Lymphadenopathy:    He has no cervical adenopathy.  Neurological: He is alert.  Skin: Skin is warm and dry. He is not diaphoretic.      Labs reviewed: Basic Metabolic Panel:  Recent Labs  96/04/54 2118  07/10/13 0540 07/11/13 0500  09/11/13 0335 09/12/13 0601 09/13/13 0530  NA  --   < > 136 136  < > 131* 132* 132*  K  --   < > 4.1 3.4*  < > 3.1* 4.6 4.6  CL  --   < > 99 99  < > 100 103 103  CO2  --   < > 25 24  < > 18* 17* 17*  GLUCOSE  --   < > 88 86  < > 84 79 77  BUN  --   < > 25* 21  < > 38* 28* 22  CREATININE  --   < > 2.39* 1.85*  < > 1.93* 1.52* 1.52*  CALCIUM  --   < > 8.7 8.5  < > 8.5 8.4 9.0  MG 1.4*  --  1.6 1.5  --   --   --   --   PHOS  --   --  2.9 2.7  --   --   --   --   < > = values in this interval not displayed. Liver Function Tests:  Recent Labs  07/27/13 1531 07/27/13 2044 09/09/13 2002  AST 27 33 19  ALT 12 9 9   ALKPHOS 77 83 104  BILITOT 1.6* 0.8 1.3*  PROT 8.8* 8.9* 8.3  ALBUMIN 2.9* 2.7* 3.1*    Recent Labs  07/04/13 1121  LIPASE 13    No results found for this basename: AMMONIA,  in the last 8760 hours CBC:  Recent Labs  07/27/13 2044 09/09/13 2002 09/11/13 0335  WBC 6.7 8.8 6.3  NEUTROABS 4.6 7.5 5.0  HGB 10.7* 11.6* 9.3*  HCT 30.1* 33.3* 26.4*  MCV 99.0 93.3 93.3  PLT 495* 253 173   Cardiac Enzymes:  Recent Labs  07/05/13 0610  07/12/13 1853 07/27/13 2245 09/09/13 2002  CKTOTAL 4465*  < > 111 34 78  TROPONINI <0.30  --  <0.30 <0.30  --   < > = values in this interval not displayed.   Assessment/Plan 1. Protein-calorie malnutrition, severe Pt with ongoing weight loss on megace Will dc this and start remeron 15 mg qhs at this time to see if this has better affect on appetite   2. UTI (urinary tract infection) -on chart review it appears he should have been discharged on bactrim BID for 7 days; this order was not sent with him from hospital; will start this at this time along with florastor BID for 14 days for GI health  3. BPH with obstruction/lower urinary tract symptoms -conts with indwelling cath  4. Renal failure Will follow up bmp  5. CHF (congestive heart failure) Stable; no signs of worsening heart failure  6. HYPERTENSION Patient is stable; continue current regimen. Will monitor and make changes as necessary.  Labs ordered: CBC and BMO

## 2013-09-16 LAB — CULTURE, BLOOD (ROUTINE X 2): Culture: NO GROWTH

## 2013-09-19 ENCOUNTER — Non-Acute Institutional Stay (SKILLED_NURSING_FACILITY): Payer: Medicare Other | Admitting: Internal Medicine

## 2013-09-19 DIAGNOSIS — I509 Heart failure, unspecified: Secondary | ICD-10-CM

## 2013-09-19 DIAGNOSIS — N179 Acute kidney failure, unspecified: Secondary | ICD-10-CM

## 2013-09-19 DIAGNOSIS — N390 Urinary tract infection, site not specified: Secondary | ICD-10-CM

## 2013-09-19 DIAGNOSIS — B9620 Unspecified Escherichia coli [E. coli] as the cause of diseases classified elsewhere: Secondary | ICD-10-CM

## 2013-09-19 DIAGNOSIS — A498 Other bacterial infections of unspecified site: Secondary | ICD-10-CM

## 2013-09-19 DIAGNOSIS — N401 Enlarged prostate with lower urinary tract symptoms: Secondary | ICD-10-CM

## 2013-09-19 DIAGNOSIS — R339 Retention of urine, unspecified: Secondary | ICD-10-CM

## 2013-09-19 DIAGNOSIS — I5032 Chronic diastolic (congestive) heart failure: Secondary | ICD-10-CM

## 2013-09-19 NOTE — Progress Notes (Signed)
Facility; [facility] Chief complaint; readmission to the facility post readmission to [facility] History; as I understand things this man was discharged home from the on? October 17. He lasted one day at home with his grandson and was admitted to hospital with urosepsis/SIRS, acute renal failure. His urine culture grew Escherichia coli and blood cultures were negative. He has a permanent Foley catheter in place secondary to severe BPH. Staff report that he is not eating and drink. His admission weight is 106 pounds down from an admission weight in August of 120lbs patient states he has anorexia but no dysphagia.  Previously he was admitted here in August. At that time I believe he had a been discharged from the hospital and then readmitted after 24 hours with a fall, rhabdomyolysis, generalized weakness. He rehabilitated in the facility to the point where he could ambulate with a walker.  Past Medical History  Diagnosis Date  . Osteoarthrosis, unspecified whether generalized or localized, other specified sites   . Acute kidney failure with other specified pathological lesion in kidney   . Prostatitis   . Postsurgical percutaneous transluminal coronary angioplasty status     weintraub  . Cataract extraction status `  . Peripheral vascular disease, unspecified   . Personal history of malignant neoplasm of larynx   . Elevated prostate specific antigen (PSA)   . Gout, unspecified   . Acute peptic ulcer with hemorrhage   . Unspecified essential hypertension   . Hyperlipidemia   . Diastolic heart failure   . Pulmonary edema   . FTT (failure to thrive) in adult   . Cancer larynx     throat   Past Surgical History  Procedure Laterality Date  . Percutaneous translumional coronary angioplastty      stents to bilateral legs for PVD  . Cataract extraction    . Truncal vagotomy  06/2005  . Gastrojejunosotomy  06/2005  . Cholecystectomy  06/2005   Medications; liquid thickener, Tylenol 650  every 6 when necessary, Norvasc 5 mg daily, enteric-coated aspirin 81 mg daily, Lasix 40 mg daily, lactulose free nutritional supplement twice a day, Megace 400 mg daily, Lopressor 50 mg daily, Zofran 4 mg every 6 when necessary, KCl 20 mEq daily, Senokot S. one tablet by mouth daily  Social history; is clearly stated in the physician's discharge summary this patient is a DO NOT RESUSCITATE although I don't see this paperwork. 4 he came in here on August the I believe he was living on his own. We discharged him and days ago to his grandsons house I believe  family history includes Heart disease in his brother and father; Prostate cancer in his brother.  Review of systems Respiratory; the patient does not complain of shortness of breath at rest or cough Cardiac; no complaints of chest pain GI; he does not complain of odynophagia or dysphagia. No abdominal pain no diarrhea. He does state he has absolute anorexia, absolutely nothing tastes good and he has absolutely no appetite. GU Foley catheter in place this is not new  Physical examination; Gen. the patient has even more of a gaunt appearance than I remember. He does not appear to be acutely medically ill Vitals; pulse 80, respiratory rate 22 he is afebrile Respiratory; shallow but otherwise clear entry bilaterally. There is no wheezing. Work of breathing is normal. There is no accessory muscle use Cardiac; heart sounds are almost inaudible. There is no murmurs. Jugular venous pressure is not elevated. He is not grossly dehydrated Abdomen; midline surgical  scar. There is no liver no spleen no masses and no overt tenderness GU Foley catheter in place apparently secondary to BPH this is not new Musculoskeletal; diffuse muscular weakness 4/5 in his upper extremities 3/5 in the proximal lower extremities. His reflexes are present. Marked muscle wasting however I seen no evidence of fasciculations Extremities; he has changes of venous stasis and  apparently has a history of PAD with stents to his bilateral legs the. His posterior tibial and dorsalis pedis pulses are palpable. There is no open areas on his feet  Mental status somewhat of a flat affect although he does not appear too much different than his last stay here  Impression/plan #1 urosepsis with Escherichia coli he has completed his antibiotics he appears to be stable. Chronic Foley catheter in place at one point followed by urology #2 probable significant COPD #3 weight loss in an already very emaciated-looking man. He describes anorexia. He was put on Megace and Remeron #4 acute renal failure this will need to be rechecked he is a chronic renal insufficiency with I believe a creatinine of roughly 1.5 #5 hypertension #6 history of hyperlipidemia although I agree at this stage and age consideration of a statin has not warranted  This is a very frail man who appears to have a progressive failure to thrive syndrome. Whether there is an unidentified medical cause of this or perhaps a psychiatric 1 [depression] at this point I am largely uncertain. Nevertheless if we cannot correct this I think his prognosis in the medium term is poor #7 history of laryngeal cancer don't have much medical information on this, at some point I'll need to research this

## 2013-09-29 ENCOUNTER — Telehealth: Payer: Self-pay

## 2013-09-29 NOTE — Telephone Encounter (Signed)
Phone call from patient's grandson, Tiburcio Bash. He states patient is in a rehab facility and may be transferred to hospice care. He wants to know your thoughts. Also he asks does he need a follow up appt with you? Please advise. He is aware you are out of the office today and will receive a call Monday. He would like you to call.

## 2013-10-02 NOTE — Telephone Encounter (Signed)
Attempted to call back. Will try later.

## 2013-10-04 NOTE — Telephone Encounter (Signed)
Attempted to call back- left msg: hospice is a good idea.

## 2013-10-05 ENCOUNTER — Telehealth: Payer: Self-pay | Admitting: *Deleted

## 2013-10-05 DIAGNOSIS — IMO0002 Reserved for concepts with insufficient information to code with codable children: Secondary | ICD-10-CM

## 2013-10-05 DIAGNOSIS — R634 Abnormal weight loss: Secondary | ICD-10-CM

## 2013-10-05 DIAGNOSIS — I509 Heart failure, unspecified: Secondary | ICD-10-CM

## 2013-10-05 DIAGNOSIS — D696 Thrombocytopenia, unspecified: Secondary | ICD-10-CM

## 2013-10-05 DIAGNOSIS — N19 Unspecified kidney failure: Secondary | ICD-10-CM

## 2013-10-05 DIAGNOSIS — I739 Peripheral vascular disease, unspecified: Secondary | ICD-10-CM

## 2013-10-05 DIAGNOSIS — E43 Unspecified severe protein-calorie malnutrition: Secondary | ICD-10-CM

## 2013-10-05 NOTE — Telephone Encounter (Signed)
Referral in to [facility]

## 2013-10-05 NOTE — Telephone Encounter (Signed)
Tiburcio Bash, pts grandson, called requesting a Hospice Referral.  Please advise

## 2013-10-05 NOTE — Telephone Encounter (Signed)
Spoke with grandson of MDs message

## 2013-10-15 ENCOUNTER — Encounter (HOSPITAL_COMMUNITY): Payer: Self-pay | Admitting: Emergency Medicine

## 2013-10-15 ENCOUNTER — Emergency Department (HOSPITAL_COMMUNITY): Payer: Medicare Other

## 2013-10-15 ENCOUNTER — Inpatient Hospital Stay (HOSPITAL_COMMUNITY)
Admission: EM | Admit: 2013-10-15 | Discharge: 2013-10-18 | DRG: 682 | Disposition: A | Discharge status: Hospice | Payer: Medicare Other | Attending: Internal Medicine | Admitting: Internal Medicine

## 2013-10-15 DIAGNOSIS — I2510 Atherosclerotic heart disease of native coronary artery without angina pectoris: Secondary | ICD-10-CM | POA: Diagnosis present

## 2013-10-15 DIAGNOSIS — N135 Crossing vessel and stricture of ureter without hydronephrosis: Secondary | ICD-10-CM | POA: Diagnosis present

## 2013-10-15 DIAGNOSIS — Z8042 Family history of malignant neoplasm of prostate: Secondary | ICD-10-CM

## 2013-10-15 DIAGNOSIS — J438 Other emphysema: Secondary | ICD-10-CM | POA: Diagnosis present

## 2013-10-15 DIAGNOSIS — N401 Enlarged prostate with lower urinary tract symptoms: Secondary | ICD-10-CM | POA: Diagnosis present

## 2013-10-15 DIAGNOSIS — N139 Obstructive and reflux uropathy, unspecified: Secondary | ICD-10-CM | POA: Diagnosis present

## 2013-10-15 DIAGNOSIS — IMO0002 Reserved for concepts with insufficient information to code with codable children: Secondary | ICD-10-CM | POA: Diagnosis present

## 2013-10-15 DIAGNOSIS — Z515 Encounter for palliative care: Secondary | ICD-10-CM

## 2013-10-15 DIAGNOSIS — I509 Heart failure, unspecified: Secondary | ICD-10-CM | POA: Diagnosis present

## 2013-10-15 DIAGNOSIS — Z9861 Coronary angioplasty status: Secondary | ICD-10-CM

## 2013-10-15 DIAGNOSIS — N138 Other obstructive and reflux uropathy: Secondary | ICD-10-CM | POA: Diagnosis present

## 2013-10-15 DIAGNOSIS — M1990 Unspecified osteoarthritis, unspecified site: Secondary | ICD-10-CM | POA: Diagnosis present

## 2013-10-15 DIAGNOSIS — N19 Unspecified kidney failure: Secondary | ICD-10-CM

## 2013-10-15 DIAGNOSIS — R634 Abnormal weight loss: Secondary | ICD-10-CM

## 2013-10-15 DIAGNOSIS — I10 Essential (primary) hypertension: Secondary | ICD-10-CM

## 2013-10-15 DIAGNOSIS — I739 Peripheral vascular disease, unspecified: Secondary | ICD-10-CM | POA: Diagnosis present

## 2013-10-15 DIAGNOSIS — N178 Other acute kidney failure: Secondary | ICD-10-CM | POA: Diagnosis present

## 2013-10-15 DIAGNOSIS — N184 Chronic kidney disease, stage 4 (severe): Secondary | ICD-10-CM | POA: Diagnosis present

## 2013-10-15 DIAGNOSIS — N390 Urinary tract infection, site not specified: Secondary | ICD-10-CM | POA: Diagnosis present

## 2013-10-15 DIAGNOSIS — M109 Gout, unspecified: Secondary | ICD-10-CM | POA: Diagnosis present

## 2013-10-15 DIAGNOSIS — Z8249 Family history of ischemic heart disease and other diseases of the circulatory system: Secondary | ICD-10-CM

## 2013-10-15 DIAGNOSIS — N179 Acute kidney failure, unspecified: Principal | ICD-10-CM | POA: Diagnosis present

## 2013-10-15 DIAGNOSIS — Z9849 Cataract extraction status, unspecified eye: Secondary | ICD-10-CM

## 2013-10-15 DIAGNOSIS — Z681 Body mass index (BMI) 19 or less, adult: Secondary | ICD-10-CM

## 2013-10-15 DIAGNOSIS — R627 Adult failure to thrive: Secondary | ICD-10-CM | POA: Diagnosis present

## 2013-10-15 DIAGNOSIS — I129 Hypertensive chronic kidney disease with stage 1 through stage 4 chronic kidney disease, or unspecified chronic kidney disease: Secondary | ICD-10-CM | POA: Diagnosis present

## 2013-10-15 DIAGNOSIS — Z66 Do not resuscitate: Secondary | ICD-10-CM | POA: Diagnosis present

## 2013-10-15 DIAGNOSIS — R413 Other amnesia: Secondary | ICD-10-CM | POA: Diagnosis present

## 2013-10-15 DIAGNOSIS — R5381 Other malaise: Secondary | ICD-10-CM | POA: Diagnosis present

## 2013-10-15 DIAGNOSIS — Z87440 Personal history of urinary (tract) infections: Secondary | ICD-10-CM

## 2013-10-15 DIAGNOSIS — E860 Dehydration: Secondary | ICD-10-CM | POA: Diagnosis present

## 2013-10-15 DIAGNOSIS — Z8521 Personal history of malignant neoplasm of larynx: Secondary | ICD-10-CM

## 2013-10-15 DIAGNOSIS — N289 Disorder of kidney and ureter, unspecified: Secondary | ICD-10-CM

## 2013-10-15 DIAGNOSIS — R972 Elevated prostate specific antigen [PSA]: Secondary | ICD-10-CM

## 2013-10-15 DIAGNOSIS — E875 Hyperkalemia: Secondary | ICD-10-CM | POA: Diagnosis present

## 2013-10-15 DIAGNOSIS — E785 Hyperlipidemia, unspecified: Secondary | ICD-10-CM | POA: Diagnosis present

## 2013-10-15 DIAGNOSIS — E43 Unspecified severe protein-calorie malnutrition: Secondary | ICD-10-CM

## 2013-10-15 DIAGNOSIS — I5032 Chronic diastolic (congestive) heart failure: Secondary | ICD-10-CM | POA: Diagnosis present

## 2013-10-15 LAB — URINALYSIS, ROUTINE W REFLEX MICROSCOPIC
Bilirubin Urine: NEGATIVE
Ketones, ur: NEGATIVE mg/dL
Protein, ur: 30 mg/dL — AB
Urobilinogen, UA: 0.2 mg/dL (ref 0.0–1.0)
pH: 5.5 (ref 5.0–8.0)

## 2013-10-15 LAB — POCT I-STAT, CHEM 8
BUN: 43 mg/dL — ABNORMAL HIGH (ref 6–23)
Calcium, Ion: 1 mmol/L — ABNORMAL LOW (ref 1.13–1.30)
Chloride: 107 mEq/L (ref 96–112)
Creatinine, Ser: 3.4 mg/dL — ABNORMAL HIGH (ref 0.50–1.35)
Glucose, Bld: 109 mg/dL — ABNORMAL HIGH (ref 70–99)
Hemoglobin: 13.6 g/dL (ref 13.0–17.0)
Sodium: 136 mEq/L (ref 135–145)
TCO2: 16 mmol/L (ref 0–100)

## 2013-10-15 LAB — CBC WITH DIFFERENTIAL/PLATELET
Basophils Absolute: 0 10*3/uL (ref 0.0–0.1)
Basophils Relative: 0 % (ref 0–1)
Eosinophils Absolute: 0 10*3/uL (ref 0.0–0.7)
Eosinophils Relative: 0 % (ref 0–5)
HCT: 33.4 % — ABNORMAL LOW (ref 39.0–52.0)
Hemoglobin: 11.2 g/dL — ABNORMAL LOW (ref 13.0–17.0)
MCV: 93 fL (ref 78.0–100.0)
Neutrophils Relative %: 80 % — ABNORMAL HIGH (ref 43–77)
Platelets: 319 10*3/uL (ref 150–400)
RBC: 3.59 MIL/uL — ABNORMAL LOW (ref 4.22–5.81)
WBC: 8.1 10*3/uL (ref 4.0–10.5)

## 2013-10-15 LAB — BASIC METABOLIC PANEL
BUN: 42 mg/dL — ABNORMAL HIGH (ref 6–23)
Chloride: 100 mEq/L (ref 96–112)
Creatinine, Ser: 3.16 mg/dL — ABNORMAL HIGH (ref 0.50–1.35)
GFR calc Af Amer: 18 mL/min — ABNORMAL LOW (ref >90)
Glucose, Bld: 88 mg/dL (ref 70–99)
Potassium: 6.1 mEq/L — ABNORMAL HIGH (ref 3.5–5.1)
Sodium: 137 mEq/L (ref 135–145)

## 2013-10-15 LAB — URINE MICROSCOPIC-ADD ON

## 2013-10-15 LAB — POTASSIUM: Potassium: 5.8 meq/L — ABNORMAL HIGH (ref 3.5–5.1)

## 2013-10-15 LAB — TROPONIN I: Troponin I: <0.3 ng/mL (ref <0.30)

## 2013-10-15 MED ORDER — GUAIFENESIN ER 600 MG PO TB12
600.0000 mg | ORAL_TABLET | Freq: Two times a day (BID) | ORAL | Status: DC
  Administered 2013-10-15 – 2013-10-18 (×5): 600 mg via ORAL
  Filled 2013-10-15 (×10): qty 1

## 2013-10-15 MED ORDER — METOPROLOL TARTRATE 50 MG PO TABS
50.0000 mg | ORAL_TABLET | Freq: Every day | ORAL | Status: DC
  Administered 2013-10-15 – 2013-10-17 (×3): 50 mg via ORAL
  Filled 2013-10-15 (×6): qty 1

## 2013-10-15 MED ORDER — STARCH (THICKENING) PO POWD
5.0000 g | Freq: Every day | ORAL | Status: DC
  Administered 2013-10-15: 13:00:00 5 g via ORAL
  Filled 2013-10-15: qty 227

## 2013-10-15 MED ORDER — ENOXAPARIN SODIUM 40 MG/0.4ML ~~LOC~~ SOLN
40.0000 mg | SUBCUTANEOUS | Status: DC
  Filled 2013-10-15: qty 0.4

## 2013-10-15 MED ORDER — ALBUTEROL SULFATE (5 MG/ML) 0.5% IN NEBU: 2.5000 mg | INHALATION_SOLUTION | RESPIRATORY_TRACT | Status: DC | PRN

## 2013-10-15 MED ORDER — ACETAMINOPHEN 650 MG RE SUPP: 650.0000 mg | Freq: Four times a day (QID) | RECTAL | Status: DC | PRN

## 2013-10-15 MED ORDER — DEXTROSE 5 % IV SOLN
1.0000 g | Freq: Once | INTRAVENOUS | Status: AC
  Administered 2013-10-15: 1 g via INTRAVENOUS
  Filled 2013-10-15: qty 10

## 2013-10-15 MED ORDER — ONDANSETRON HCL 4 MG PO TABS: 4.0000 mg | ORAL_TABLET | Freq: Four times a day (QID) | ORAL | Status: DC | PRN

## 2013-10-15 MED ORDER — ENOXAPARIN SODIUM 30 MG/0.3ML ~~LOC~~ SOLN
30.0000 mg | SUBCUTANEOUS | Status: DC
  Administered 2013-10-15 – 2013-10-18 (×4): 30 mg via SUBCUTANEOUS
  Filled 2013-10-15 (×4): qty 0.3

## 2013-10-15 MED ORDER — HYDROCODONE-ACETAMINOPHEN 5-325 MG PO TABS: 1.0000 | ORAL_TABLET | ORAL | Status: DC | PRN

## 2013-10-15 MED ORDER — DOCUSATE SODIUM 100 MG PO CAPS
100.0000 mg | ORAL_CAPSULE | Freq: Two times a day (BID) | ORAL | Status: DC
  Administered 2013-10-15 – 2013-10-17 (×3): 100 mg via ORAL
  Filled 2013-10-15 (×10): qty 1

## 2013-10-15 MED ORDER — SODIUM POLYSTYRENE SULFONATE 15 GM/60ML PO SUSP
15.0000 g | Freq: Once | ORAL | Status: AC
  Administered 2013-10-15: 21:00:00 15 g via ORAL
  Filled 2013-10-15: qty 60

## 2013-10-15 MED ORDER — MIRTAZAPINE 15 MG PO TABS
15.0000 mg | ORAL_TABLET | Freq: Every day | ORAL | Status: DC
  Administered 2013-10-15 – 2013-10-16 (×2): 15 mg via ORAL
  Filled 2013-10-15 (×5): qty 1

## 2013-10-15 MED ORDER — SENNA-DOCUSATE SODIUM 8.6-50 MG PO TABS
1.0000 | ORAL_TABLET | Freq: Every day | ORAL | Status: DC
  Administered 2013-10-15 – 2013-10-17 (×3): 1 via ORAL
  Filled 2013-10-15 (×5): qty 1

## 2013-10-15 MED ORDER — ACETAMINOPHEN 325 MG PO TABS: 650.0000 mg | ORAL_TABLET | Freq: Four times a day (QID) | ORAL | Status: DC | PRN

## 2013-10-15 MED ORDER — DEXTROSE 5 % IV SOLN
1.0000 g | INTRAVENOUS | Status: DC
  Administered 2013-10-16 – 2013-10-17 (×2): 1 g via INTRAVENOUS
  Filled 2013-10-15 (×2): qty 10

## 2013-10-15 MED ORDER — SODIUM CHLORIDE 0.9 % IJ SOLN
3.0000 mL | Freq: Two times a day (BID) | INTRAMUSCULAR | Status: DC
Start: 2013-10-15 — End: 2013-10-18
  Administered 2013-10-16: 3 mL via INTRAVENOUS

## 2013-10-15 MED ORDER — INFLUENZA VAC SPLIT QUAD 0.5 ML IM SUSP
0.5000 mL | INTRAMUSCULAR | Status: DC
  Filled 2013-10-15 (×2): qty 0.5

## 2013-10-15 MED ORDER — SODIUM CHLORIDE 0.9 % IV SOLN
INTRAVENOUS | Status: DC
  Administered 2013-10-15 – 2013-10-17 (×4): via INTRAVENOUS

## 2013-10-15 MED ORDER — SODIUM CHLORIDE 0.9 % IV BOLUS (SEPSIS)
500.0000 mL | Freq: Once | INTRAVENOUS | Status: AC
  Administered 2013-10-15: 500 mL via INTRAVENOUS

## 2013-10-15 MED ORDER — ASPIRIN EC 81 MG PO TBEC
81.0000 mg | DELAYED_RELEASE_TABLET | Freq: Every day | ORAL | Status: DC
  Administered 2013-10-15 – 2013-10-17 (×3): 81 mg via ORAL
  Filled 2013-10-15 (×5): qty 1

## 2013-10-15 MED ORDER — MEGESTROL ACETATE 400 MG/10ML PO SUSP
400.0000 mg | Freq: Every day | ORAL | Status: DC
  Administered 2013-10-15: 13:00:00 400 mg via ORAL
  Filled 2013-10-15 (×5): qty 10

## 2013-10-15 MED ORDER — IPRATROPIUM BROMIDE 0.02 % IN SOLN
0.5000 mg | Freq: Four times a day (QID) | RESPIRATORY_TRACT | Status: DC
  Administered 2013-10-15 (×2): 0.5 mg via RESPIRATORY_TRACT
  Filled 2013-10-15 (×3): qty 2.5

## 2013-10-15 MED ORDER — SACCHAROMYCES BOULARDII 250 MG PO CAPS
250.0000 mg | ORAL_CAPSULE | Freq: Two times a day (BID) | ORAL | Status: DC
  Administered 2013-10-15 – 2013-10-18 (×5): 250 mg via ORAL
  Filled 2013-10-15 (×11): qty 1

## 2013-10-15 MED ORDER — SODIUM POLYSTYRENE SULFONATE 15 GM/60ML PO SUSP
30.0000 g | Freq: Once | ORAL | Status: AC
  Administered 2013-10-15: 30 g via ORAL
  Filled 2013-10-15 (×2): qty 120

## 2013-10-15 MED ORDER — ONDANSETRON HCL 4 MG/2ML IJ SOLN: 4.0000 mg | Freq: Four times a day (QID) | INTRAMUSCULAR | Status: DC | PRN

## 2013-10-15 NOTE — ED Notes (Signed)
Patient arrives with pre-existing foley cath--urine noted to be very cloudy with sediment and mucous threads present Patient repeatedly trying to remove NRB mask

## 2013-10-15 NOTE — Progress Notes (Signed)
Unable to obtain accurate pulse oximetry reading even after multiple attempts in different locations of the body. All other VS are stable and pt is not in respiratory distress, with no c/o shortness of breath. MD notified. No new orders obtained at this time. Will continue to monitor pt.

## 2013-10-15 NOTE — H&P (Signed)
PCP:  Illene Regulus, MD    Chief Complaint:   confusion  HPI: Wesley [name] is a 79 y.o. male   has a past medical history of Osteoarthrosis, unspecified whether generalized or localized, other specified sites; Acute kidney failure with other specified pathological lesion in kidney; Prostatitis; Postsurgical percutaneous transluminal coronary angioplasty status; Cataract extraction status (`); Peripheral vascular disease, unspecified; Personal history of malignant neoplasm of larynx; Elevated prostate specific antigen (PSA); Gout, unspecified; Acute peptic ulcer with hemorrhage; Unspecified essential hypertension; Hyperlipidemia; Diastolic heart failure; Pulmonary edema; FTT (failure to thrive) in adult; and Cancer.   Presented with  Patient was brought in [facility]. Patient was acting more confused. On arrival to [facility] she was found to have urinary tract infection. Patient has indwelling Foley secondary to severe BPH. His creatinine was also noted to be elevated above 3 from a baseline of 1.5. Patient appears to be dehydrated clinically. He is somewhat confused and unable to provide detailed history. Reviewing her history shows that he had had previous similar admission subacute renal failure and UTI. Hospitalist called for admission he otherwise denies any fevers chills or chest pains  Review of Systems:   Pertinent positives include: Confusion  Constitutional:  No weight loss, night sweats, Fevers, chills, fatigue, weight loss  HEENT:  No headaches, Difficulty swallowing,Tooth/dental problems,Sore throat,  No sneezing, itching, ear ache, nasal congestion, post nasal drip,  Cardio-vascular:  No chest pain, Orthopnea, PND, anasarca, dizziness, palpitations.no Bilateral lower extremity swelling  GI:  No heartburn, indigestion, abdominal pain, nausea, vomiting, diarrhea, change in bowel habits, loss of appetite, melena, blood in stool, hematemesis Resp:  no  shortness of breath at rest. No dyspnea on exertion, No excess mucus, no productive cough, No non-productive cough, No coughing up of blood.No change in color of mucus.No wheezing. Skin:  no rash or lesions. No jaundice GU:  no dysuria, change in color of urine, no urgency or frequency. No straining to urinate.  No flank pain.  Musculoskeletal:  No joint pain or no joint swelling. No decreased range of motion. No back pain.  Psych:  No change in mood or affect. No depression or anxiety. No memory loss.  Neuro: no localizing neurological complaints, no tingling, no weakness, no double vision, no gait abnormality, no slurred speech,  Otherwise ROS are negative except for above, 10 systems were reviewed  Past Medical History: Past Medical History  Diagnosis Date  . Osteoarthrosis, unspecified whether generalized or localized, other specified sites   . Acute kidney failure with other specified pathological lesion in kidney   . Prostatitis   . Postsurgical percutaneous transluminal coronary angioplasty status     weintraub  . Cataract extraction status `  . Peripheral vascular disease, unspecified   . Personal history of malignant neoplasm of larynx   . Elevated prostate specific antigen (PSA)   . Gout, unspecified   . Acute peptic ulcer with hemorrhage   . Unspecified essential hypertension   . Hyperlipidemia   . Diastolic heart failure   . Pulmonary edema   . FTT (failure to thrive) in adult   . Cancer     throat   Past Surgical History  Procedure Laterality Date  . Percutaneous translumional coronary angioplastty      stents to bilateral legs for PVD  . Cataract extraction    . Truncal vagotomy  06/2005  . Gastrojejunosotomy  06/2005  . Cholecystectomy  06/2005     Medications: Prior to Admission medications  Medication Sig Start Date End Date Taking? Authorizing Provider  acetaminophen (TYLENOL) 325 MG tablet Take 2 tablets (650 mg total) by mouth every 6 (six) hours  as needed for pain. 07/17/13  Yes Jacques Navy, MD  amLODipine (NORVASC) 5 MG tablet Take 5 mg by mouth daily.   Yes Historical Provider, MD  aspirin EC 81 MG tablet Take 81 mg by mouth daily.   Yes Historical Provider, MD  furosemide (LASIX) 40 MG tablet Take 1 tablet (40 mg total) by mouth daily. 07/17/13  Yes Jacques Navy, MD  megestrol (MEGACE) 400 MG/10ML suspension Take 10 mLs (400 mg total) by mouth daily. 07/27/13  Yes Jacques Navy, MD  metoprolol (LOPRESSOR) 50 MG tablet Take 50 mg by mouth daily.   Yes Historical Provider, MD  mirtazapine (REMERON) 15 MG tablet Take 15 mg by mouth at bedtime.   Yes Historical Provider, MD  ondansetron (ZOFRAN) 4 MG tablet Take 1 tablet (4 mg total) by mouth every 6 (six) hours as needed for nausea. 07/17/13  Yes Jacques Navy, MD  potassium chloride SA (K-DUR,KLOR-CON) 20 MEQ tablet Take 20 mEq by mouth daily.   Yes Historical Provider, MD  saccharomyces boulardii (FLORASTOR) 250 MG capsule Take 250 mg by mouth 2 (two) times daily.   Yes Historical Provider, MD  sennosides-docusate sodium (SENOKOT-S) 8.6-50 MG tablet Take 1 tablet by mouth daily.   Yes Historical Provider, MD  food thickener (THICK IT) POWD Take 5 g by mouth daily. 09/13/13   Jacques Navy, MD  lactose free nutrition (BOOST) LIQD Take 237 mLs by mouth 2 (two) times daily between meals.    Historical Provider, MD    Allergies:  No Known Allergies  Social History:  Bed bound [facility]   reports that he has been smoking Cigarettes and Cigars.  He has been smoking about 0.50 packs per day. He does not have any smokeless tobacco history on file. He reports that he drinks alcohol. He reports that he does not use illicit drugs.   Family History: family history includes Heart disease in his brother and father; Prostate cancer in his brother.    Physical Exam: Patient Vitals for the past 24 hrs:  BP Temp Temp src Pulse Resp SpO2   10/15/13 0538 - - - - - 89 %  10/15/13 0508 - - - 67 24 -  10/15/13 0252 - 96.7 F (35.9 C) Rectal - - -  10/15/13 0226 116/59 mmHg - - - - -  10/15/13 0216 89/70 mmHg 98 F (36.7 C) Oral 62 24 -    1. General:  in No Acute distress cachectic appearing 2. Psychological: Alert and partially Oriented 3. Head/ENT:   Dry Mucous Membranes                          Head Non traumatic, neck supple                           Poor Dentition upper dentures only present 4. SKIN:  decreased Skin turgor,  Skin clean Dry and intact no rash 5. Heart: Regular rate and rhythm no Murmur, Rub or gallop 6. Lungs: no wheezes or crackles  somewhat distant 7. Abdomen: Soft, non-tender, Non distended cachectic 8. Lower extremities: no clubbing, cyanosis, or edema, left foot in Bunny Boot 9. Neurologically Grossly intact, moving all 4 extremities equally  10. MSK: Normal range of motion  body mass index is unknown because there is no weight on file.   Labs on Admission:   Recent Labs  10/15/13 0352  NA 136  K 5.9*  CL 107  GLUCOSE 109*  BUN 43*  CREATININE 3.40*   No results found for this basename: AST, ALT, ALKPHOS, BILITOT, PROT, ALBUMIN,  in the last 72 hours No results found for this basename: LIPASE, AMYLASE,  in the last 72 hours  Recent Labs  10/15/13 0340 10/15/13 0352  WBC 8.1  --   NEUTROABS 6.5  --   HGB 11.2* 13.6  HCT 33.4* 40.0  MCV 93.0  --   PLT 319  --    No results found for this basename: CKTOTAL, CKMB, CKMBINDEX, TROPONINI,  in the last 72 hours No results found for this basename: TSH, T4TOTAL, FREET3, T3FREE, THYROIDAB,  in the last 72 hours No results found for this basename: VITAMINB12, FOLATE, FERRITIN, TIBC, IRON, RETICCTPCT,  in the last 72 hours No results found for this basename: HGBA1C    The CrCl is unknown because both a height and weight (above a minimum accepted value) are required for this calculation. ABG    Component Value Date/Time   TCO2 16  10/15/2013 0352     No results found for this basename: DDIMER     Other results:  I have pearsonaly reviewed this: ECG REPORT  UA evidence of UTI  Cultures:    Component Value Date/Time   SDES BLOOD LEFT HAND 09/09/2013 2240   SPECREQUEST BOTTLES DRAWN AEROBIC ONLY 5CC 09/09/2013 2240   CULT  Value: NO GROWTH 5 DAYS Performed at [facility] 09/09/2013 2240   REPTSTATUS 09/16/2013 FINAL 09/09/2013 2240       Radiological Exams on Admission: Dg Chest 2 View  10/15/2013   CLINICAL DATA:  Shortness of breath  EXAM: CHEST  2 VIEW  COMPARISON:  09/11/2013  FINDINGS: Hyperinflated lungs with apical emphysematous changes. There is a persistent linear opacity at the left base associated with elevated left diaphragm. No edema or acute infiltrate suspected. No significant effusion. No pneumothorax. Normal heart size. Tortuous thoracic aorta.  IMPRESSION: Emphysema and scarring.  No evidence of acute superimposed disease.   Electronically Signed   By: Tiburcio Pea M.D.   On: 10/15/2013 03:38    Chart has been reviewed  Assessment/Plan  This is a 79 year old gentleman with history of laryngeal cancer and failure to thrive here acute renal failure and UTI  Present on Admission:  ACUTE RENAL FAIL W/OTH SPEC PATHAL LESION KIDNEY - will hold Lasix give gentle IV fluids, if there is no evidence of improvement would need a renal ultrasound to evaluate for any obstruction which is less likely given the patient has been found for   Dehydration - patient currently has poor by mouth intake we'll give IV fluid  . CHF (congestive heart failure) - currently on the dehydrated side hold Lasix  . BPH with obstruction/lower urinary tract symptoms - continue on Foley needs to be replaced with me to consult urology  . UTI (urinary tract infection) - on Rocephin and await results of urine culture  . Failure to thrive ordered prealbumin nutrition consult  . Hyperkalemia - will give kayexalate,  monitor on telemetry, repeat BMET   Prophylaxis:  Lovenox  CODE STATUS: DO NOT RESUSCITATE paperwork the patient confirms  Other plan as per orders.  I have spent a total of 55 min on this admission  Jahyra Sukup 10/15/2013, 5:51 AM

## 2013-10-15 NOTE — ED Notes (Addendum)
Hospitalist at bedside--patient to be admitted for tx of UTI MD made aware of O2 sat in 88-90% range while NRB mask in place

## 2013-10-15 NOTE — Progress Notes (Signed)
Pt refused to have blood drawn by lab for the third troponin that was ordered. Attempted to provide patient education regarding the reason for the lab, but pt refused. Called and left a message with Dr. Alvera Novel answering service to inform him. Will continue to monitor pt.

## 2013-10-15 NOTE — ED Notes (Signed)
Patient checked for incontinence--no BM, brief in place Patient with Mepilex dressings in place due to previously documented pre-existing pressure ulcers Patient became upset that he was being moved--informed patient that he needed to turned

## 2013-10-15 NOTE — ED Notes (Signed)
PIV placed and labs drawn, sent NS bolus infusing, see MAR Urine sent to lab Patient denies needs at this time Side rails up, call bell in reach

## 2013-10-15 NOTE — ED Notes (Signed)
Unable to obtain accurate pulse ox NP at bedside and is aware

## 2013-10-15 NOTE — ED Provider Notes (Signed)
Medical screening examination/treatment/procedure(s) were conducted as a shared visit with non-physician practitioner(s) and myself.  I personally evaluated the patient during the encounter.  EKG Interpretation    Date/Time:    Ventricular Rate:    PR Interval:    QRS Duration:   QT Interval:    QTC Calculation:   R Axis:     Text Interpretation:              Pt comes in not feeling well. He has several medical comorbidities. Heart, lung and abd exam are normal. His labs show AKI, UTI, will admit  Derwood Kaplan, MD 10/15/13 2215

## 2013-10-15 NOTE — ED Notes (Signed)
Arrives via EMS from nursing home Patient told nursing home staff "I'm going to die", so nursing home staff called EMS Patient with failure to thrive, currently DNR  Patient c/o SOB, but will not keep O2 in place when prompted

## 2013-10-15 NOTE — ED Notes (Signed)
Bed: ZO10 Expected date: 10/15/13 Expected time: 1:50 AM Means of arrival: Ambulance Comments: 79yo M  Shortness of breath

## 2013-10-15 NOTE — ED Notes (Signed)
Patient previously stated that he might need to be checked because "I might have had a BM." This nurse entered patient room to assess and patient noted to be sleeping Patient became upset that he was woken up and asked to be "left alone." Will attempt to assess patient prior to admit to floor

## 2013-10-15 NOTE — ED Notes (Signed)
Patient given ice water Patient denies further needs at this time Side rails up, call bell in reach

## 2013-10-15 NOTE — ED Provider Notes (Signed)
CSN: [id number]     Arrival date & time 10/15/13  0201 History   First MD Initiated Contact with Patient 10/15/13 0225     Chief Complaint  Patient presents with  . Failure To Thrive   (Consider location/radiation/quality/duration/timing/severity/associated sxs/prior Treatment) HPI Comments: Patient arrives from nursing him after telling the staff, that he's going to die.  Patient has an active DO NOT RESUSCITATE diagnoses of failure to thrive do, to multiple comorbidities, including renal failure, peripheral vascular disease, hyperlipidemia, diastolic heart failure, and cancer of the larynx  The history is provided by the patient, the EMS personnel and the nursing home.    Past Medical History  Diagnosis Date  . Osteoarthrosis, unspecified whether generalized or localized, other specified sites   . Acute kidney failure with other specified pathological lesion in kidney   . Prostatitis   . Postsurgical percutaneous transluminal coronary angioplasty status     weintraub  . Cataract extraction status `  . Peripheral vascular disease, unspecified   . Personal history of malignant neoplasm of larynx   . Elevated prostate specific antigen (PSA)   . Gout, unspecified   . Acute peptic ulcer with hemorrhage   . Unspecified essential hypertension   . Hyperlipidemia   . Diastolic heart failure   . Pulmonary edema   . FTT (failure to thrive) in adult   . Cancer     throat   Past Surgical History  Procedure Laterality Date  . Percutaneous translumional coronary angioplastty      stents to bilateral legs for PVD  . Cataract extraction    . Truncal vagotomy  06/2005  . Gastrojejunosotomy  06/2005  . Cholecystectomy  06/2005   Family History  Problem Relation Age of Onset  . Heart disease Father   . Prostate cancer Brother   . Heart disease Brother    History  Substance Use Topics  . Smoking status: Current Some Day Smoker -- 0.50 packs/day    Types: Cigarettes, Cigars  .  Smokeless tobacco: Not on file  . Alcohol Use: Yes     Comment: 2-3 shots of scotch per day    Review of Systems  Constitutional: Positive for fatigue. Negative for fever and chills.  Respiratory: Positive for shortness of breath. Negative for cough.   Cardiovascular: Negative for chest pain and leg swelling.  Gastrointestinal: Negative for abdominal pain.  Musculoskeletal: Positive for myalgias. Negative for back pain.  Neurological: Positive for weakness.  All other systems reviewed and are negative.    Allergies  Review of patient's allergies indicates no known allergies.  Home Medications   Current Outpatient Rx  Name  Route  Sig  Dispense  Refill  . acetaminophen (TYLENOL) 325 MG tablet   Oral   Take 2 tablets (650 mg total) by mouth every 6 (six) hours as needed for pain.   60 tablet      . amLODipine (NORVASC) 5 MG tablet   Oral   Take 5 mg by mouth daily.         Marland Kitchen aspirin EC 81 MG tablet   Oral   Take 81 mg by mouth daily.         . furosemide (LASIX) 40 MG tablet   Oral   Take 1 tablet (40 mg total) by mouth daily.   60 tablet   5   . megestrol (MEGACE) 400 MG/10ML suspension   Oral   Take 10 mLs (400 mg total) by mouth daily.   480 mL  2   . metoprolol (LOPRESSOR) 50 MG tablet   Oral   Take 50 mg by mouth daily.         . mirtazapine (REMERON) 15 MG tablet   Oral   Take 15 mg by mouth at bedtime.         . ondansetron (ZOFRAN) 4 MG tablet   Oral   Take 1 tablet (4 mg total) by mouth every 6 (six) hours as needed for nausea.   20 tablet   0   . potassium chloride SA (K-DUR,KLOR-CON) 20 MEQ tablet   Oral   Take 20 mEq by mouth daily.         Marland Kitchen saccharomyces boulardii (FLORASTOR) 250 MG capsule   Oral   Take 250 mg by mouth 2 (two) times daily.         . sennosides-docusate sodium (SENOKOT-S) 8.6-50 MG tablet   Oral   Take 1 tablet by mouth daily.         . food thickener (THICK IT) POWD   Oral   Take 5 g by mouth  daily.      0   . lactose free nutrition (BOOST) LIQD   Oral   Take 237 mLs by mouth 2 (two) times daily between meals.          BP 116/59  Pulse 67  Temp(Src) 96.7 F (35.9 C) (Rectal)  Resp 24  SpO2 89% Physical Exam  Vitals reviewed. Constitutional: He appears well-developed. He appears cachectic. He has a sickly appearance. No distress. Face mask in place.  Eyes: Pupils are equal, round, and reactive to light.  Cardiovascular: Normal rate and regular rhythm.   Pulmonary/Chest: Effort normal and breath sounds normal.  Abdominal: Soft. There is no tenderness.  Lymphadenopathy:    He has no cervical adenopathy.  Neurological: He is alert.  Skin: Skin is warm. There is pallor.  Cool to the touch    ED Course  Procedures (including critical care time) Labs Review Labs Reviewed  CBC WITH DIFFERENTIAL - Abnormal; Notable for the following:    RBC 3.59 (*)    Hemoglobin 11.2 (*)    HCT 33.4 (*)    RDW 16.5 (*)    Neutrophils Relative % 80 (*)    All other components within normal limits  URINALYSIS, ROUTINE W REFLEX MICROSCOPIC - Abnormal; Notable for the following:    APPearance TURBID (*)    Hgb urine dipstick LARGE (*)    Protein, ur 30 (*)    Leukocytes, UA LARGE (*)    All other components within normal limits  URINE MICROSCOPIC-ADD ON - Abnormal; Notable for the following:    Bacteria, UA MANY (*)    Casts HYALINE CASTS (*)    All other components within normal limits  POCT I-STAT, CHEM 8 - Abnormal; Notable for the following:    Potassium 5.9 (*)    BUN 43 (*)    Creatinine, Ser 3.40 (*)    Glucose, Bld 109 (*)    Calcium, Ion 1.00 (*)    All other components within normal limits  URINE CULTURE   Imaging Review Dg Chest 2 View  10/15/2013   CLINICAL DATA:  Shortness of breath  EXAM: CHEST  2 VIEW  COMPARISON:  09/11/2013  FINDINGS: Hyperinflated lungs with apical emphysematous changes. There is a persistent linear opacity at the left base associated  with elevated left diaphragm. No edema or acute infiltrate suspected. No significant effusion. No pneumothorax. Normal heart  size. Tortuous thoracic aorta.  IMPRESSION: Emphysema and scarring.  No evidence of acute superimposed disease.   Electronically Signed   By: Tiburcio Pea M.D.   On: 10/15/2013 03:38    EKG Interpretation   None       MDM   1. UTI (lower urinary tract infection)   2. Dehydration   3. Acute renal insufficiency        Arman Filter, NP 10/15/13 0542  Arman Filter, NP 10/15/13 [phone]

## 2013-10-15 NOTE — Progress Notes (Signed)
Wesley [name] is well known to me. He has been admitted for recurrent UTI. He is foley dependent 2/2 extrinsic urinary obstruction from enlarged prostate. He is also dehydrated 2/2 poor PO intake.  By previous discussion he is DNR/DNI.  Talked with him about end of life care including hospice care - comfort care only. He is rational and clearly states that he does want active treatment for his infection and for dehydration. Will continue admitting orders. As DNR/DNI will not need tele monitoring.  Disposition will be to return to SNF unless he decides he wants comfort care only in which case [facility] may be an option.   Will assume care.

## 2013-10-16 DIAGNOSIS — N390 Urinary tract infection, site not specified: Secondary | ICD-10-CM

## 2013-10-16 DIAGNOSIS — E875 Hyperkalemia: Secondary | ICD-10-CM

## 2013-10-16 DIAGNOSIS — N289 Disorder of kidney and ureter, unspecified: Secondary | ICD-10-CM

## 2013-10-16 DIAGNOSIS — E43 Unspecified severe protein-calorie malnutrition: Secondary | ICD-10-CM

## 2013-10-16 DIAGNOSIS — N401 Enlarged prostate with lower urinary tract symptoms: Secondary | ICD-10-CM

## 2013-10-16 DIAGNOSIS — E860 Dehydration: Secondary | ICD-10-CM

## 2013-10-16 LAB — COMPREHENSIVE METABOLIC PANEL
ALT: 8 U/L (ref 0–53)
Albumin: 2.5 g/dL — ABNORMAL LOW (ref 3.5–5.2)
Alkaline Phosphatase: 92 U/L (ref 39–117)
CO2: 18 mEq/L — ABNORMAL LOW (ref 19–32)
Calcium: 8.3 mg/dL — ABNORMAL LOW (ref 8.4–10.5)
Creatinine, Ser: 3.39 mg/dL — ABNORMAL HIGH (ref 0.50–1.35)
GFR calc Af Amer: 17 mL/min — ABNORMAL LOW (ref >90)
Potassium: 4.3 mEq/L (ref 3.5–5.1)
Sodium: 138 mEq/L (ref 135–145)
Total Bilirubin: 0.3 mg/dL (ref 0.3–1.2)

## 2013-10-16 LAB — CBC
MCV: 92.2 fL (ref 78.0–100.0)
Platelets: 243 10*3/uL (ref 150–400)
RBC: 2.94 MIL/uL — ABNORMAL LOW (ref 4.22–5.81)
RDW: 16.4 % — ABNORMAL HIGH (ref 11.5–15.5)
WBC: 7 10*3/uL (ref 4.0–10.5)

## 2013-10-16 LAB — TSH: TSH: 3.24 u[IU]/mL (ref 0.350–4.500)

## 2013-10-16 MED ORDER — ADULT MULTIVITAMIN W/MINERALS CH
1.0000 | ORAL_TABLET | Freq: Every day | ORAL | Status: DC
  Administered 2013-10-17 – 2013-10-18 (×2): 1 via ORAL
  Filled 2013-10-16 (×3): qty 1

## 2013-10-16 MED ORDER — ENSURE PUDDING PO PUDG
1.0000 | Freq: Two times a day (BID) | ORAL | Status: DC
  Administered 2013-10-17 – 2013-10-18 (×2): 1 via ORAL
  Filled 2013-10-16 (×5): qty 1

## 2013-10-16 NOTE — Progress Notes (Signed)
Subjective: Awake and alert - asking about my grandchildren. NO distress. He is tired of thick-it and would like a grilled cheeze sandwhich. He has a very poor appetite.   Objective: Lab:  Recent Labs  10/15/13 0340 10/15/13 0352 10/16/13 0420  WBC 8.1  --  7.0  NEUTROABS 6.5  --   --   HGB 11.2* 13.6 9.3*  HCT 33.4* 40.0 27.1*  MCV 93.0  --  92.2  PLT 319  --  243    Recent Labs  10/15/13 0352 10/15/13 0826 10/15/13 1535 10/16/13 0420  NA 136 137  --  138  K 5.9* 6.1* 5.8* 4.3  CL 107 100  --  104  GLUCOSE 109* 88  --  87  BUN 43* 42*  --  43*  CREATININE 3.40* 3.16*  --  3.39*  CALCIUM  --  9.2  --  8.3*  MG  --   --   --  2.3  PHOS  --   --   --  5.4*   Urine Cx - pending Imaging:  Scheduled Meds: . aspirin EC  81 mg Oral Daily  . cefTRIAXone (ROCEPHIN)  IV  1 g Intravenous Q24H  . docusate sodium  100 mg Oral BID  . enoxaparin (LOVENOX) injection  30 mg Subcutaneous Q24H  . food thickener  5 g Oral Daily  . guaiFENesin  600 mg Oral BID  . influenza vac split quadrivalent PF  0.5 mL Intramuscular Tomorrow-1000  . megestrol  400 mg Oral Daily  . metoprolol  50 mg Oral Daily  . mirtazapine  15 mg Oral QHS  . saccharomyces boulardii  250 mg Oral BID  . sennosides-docusate sodium  1 tablet Oral Daily  . sodium chloride  3 mL Intravenous Q12H   Continuous Infusions: . sodium chloride 50 mL/hr at 10/16/13 0558   PRN Meds:.acetaminophen, acetaminophen, albuterol, HYDROcodone-acetaminophen, ondansetron (ZOFRAN) IV, ondansetron   Physical Exam: Filed Vitals:   10/16/13 0644  BP: 91/54  Pulse: 73  Temp: 97.4 F (36.3 C)  Resp: 16   Wt Readings from Last 3 Encounters:  10/15/13 97 lb 3.6 oz (44.1 kg)  09/15/13 105 lb (47.628 kg)  09/13/13 105 lb 6.1 oz (47.8 kg)   Gen'l- a cachetic man in no distress HEENT - loose false teeth, muddy sclera, arcus senilis, temporal wasting NEck - supple Cor - 2+ radial, RRR Pulm - on rebreather face mast with good  sats, no rales or wheezes. Abd - BS+, scaphoid, not tender. Neuro - awake and alert.      Assessment/Plan: 1. ID - chronic indwelling catheter 2/2 obstructive uropathy for enlarged prostate gland.  He has had multiple UTIs. Currently w/o fever, urine culture pending. #2 Rocephin  Plan Continue rocephin pending urine cultures  No indication for imaging of renal system with known obstructive uropathy due to BPH  2. ARI- chronic problem. He is not a candidate for HD. Condition exacerbated by dehydration 2/2 poor intake.  3. CHF - no clinical evidence of failure. Continue gentle hydraton  4. Hyperkalemia - 5.8 last PM, 4.3 this AM  5. Failure to thrive - feels he is dying and has very poor appetite with marked cachexia. Per discussion 11/23 he is not ready for hospice care - full palliation. Will liberalize his diet and minimize testing. Will focus on comfort care.  He has a very poor prognosis.   Illene Regulus [facility] (o) [phone]; (c) [phone] Call-grp - [facility]  Tele: [phone]  10/16/2013, 7:45 AM

## 2013-10-16 NOTE — Clinical Documentation Improvement (Signed)
THIS DOCUMENT IS NOT A PERMANENT PART OF THE MEDICAL RECORD  Please update your documentation with the medical record to reflect your response to this query. If you need help knowing how to do this please call [phone].  10/16/13   Dear Dr.M Debby Bud  In a better effort to capture your patient's severity of illness, reflect appropriate length of stay and utilization of resources, a review of the patient medical record has revealed the following indicators.    Based on your clinical judgment, please clarify and document in a progress note and/or discharge summary the clinical condition associated with the following supporting information:  In responding to this query please exercise your independent judgment.  The fact that a query is asked, does not imply that any particular answer is desired or expected. Possible Clinical Conditions?  10/16/13 Prog note "5. Failure to thrive - feels he is dying and has very poor appetite with marked cachexia. Per discussion 11/23 he is not ready for hospice care - full palliation. Will liberalize his diet and minimize testing." For accurate Dx specificity & severity can noted " Failure to thrive " & cachexia" be further specified with cond being eval'd, mon'd & tx'd. Thank you  Possible Conditions: Malnutrition Mild Malnutrition  Moderate Malnutrition Severe Malnutrition   Protein Calorie Malnutrition Severe Protein Calorie Malnutrition   Other Condition Cannot clinically determine   Supporting Information: Risk Factors: See above note  Signs & Symptoms: See above note  Diagnostics: See above note  Treatment : See above note Nutrition Consult   You may use possible, probable, or suspect with inpatient documentation. possible, probable, suspected diagnoses MUST be documented at the time of discharge  Reviewed: additional documentation in the MEDICAL RECORD NUMBER12/1/14: per dc summ "Sev pcm" documented in dc summ 10/18/13> cls'd. orm  Thank  You,  Toribio Harbour, RN, BSN, CCDS Certified Clinical Documentation Specialist Pager: [phone] Health Information Management [place]

## 2013-10-16 NOTE — Progress Notes (Signed)
INITIAL NUTRITION ASSESSMENT  DOCUMENTATION CODES Per approved criteria  -Severe malnutrition in the context of chronic illness  Pt meets criteria for Severe MALNUTRITION in the context of Chronic Illness as evidenced by 31% weight loss in less than 4 months and signs of severe muscle and fat wasting evidenced in nutrition-focused physical exam.  INTERVENTION: Provide Magic Cup BID in between meals Provide Ensure Pudding BID with meals Provide Multivitamin with minerals daily Encourage PO intake  NUTRITION DIAGNOSIS: Inadequate oral intake related to poor appetite as evidenced by pt refusing meals and 8% weight loss in the past month.   Goal: Pt to meet >/= 90% of their estimated nutrition needs   Monitor:  PO intake Weight Labs  Reason for Assessment: Consult  79 y.o. male  Admitting Dx: <principal problem not specified>  ASSESSMENT: 79 y.o. male with past medical history of Osteoarthrosis, prostatitis, peripheral vascular disease; Personal history of malignant neoplasm of larynx, gout, hyerlipidemia, dtolic heart failure, pulmonary edema, and FTT (failure to thrive) in adult. Patient was brought in [facility]. Patient was acting more confused. On arrival to emerge department she was found to have urinary tract infection.   Pt states he was not eating PTA due to poor appetite. Per nursing notes pt refused all 3 meals 11/23 but, today he ate 100% of breakfast and 50% of lunch. Pt states he was drinking Boost PTA but, he does not want Boost or Ensure while admitted. Pt requested vanilla ice cream; RD brought pt Magic Cup ice cream, pt ate a few bites. Weight history shows 8% weight loss in the past month and 31% weight loss in less than 4 months.  Nutrition Focused Physical Exam:  Subcutaneous Fat:  Orbital Region: mild wasting Upper Arm Region: severe wasting Thoracic and Lumbar Region: NA  Muscle:  Temple Region: moderate wasting Clavicle Bone Region:  severe wasting Clavicle and Acromion Bone Region: mild wasting Scapular Bone Region: NA Dorsal Hand: moderate wasting Patellar Region: severe wasting Anterior Thigh Region: severe wasting Posterior Calf Region: severe wasting  Edema: none   Height: Ht Readings from Last 1 Encounters:  10/15/13 5\' 11"  (1.803 m)    Weight: Wt Readings from Last 1 Encounters:  10/15/13 97 lb 3.6 oz (44.1 kg)    Ideal Body Weight: 172 lbs  % Ideal Body Weight: 56%  Wt Readings from Last 10 Encounters:  10/15/13 97 lb 3.6 oz (44.1 kg)  09/15/13 105 lb (47.628 kg)  09/13/13 105 lb 6.1 oz (47.8 kg)  09/08/13 112 lb (50.803 kg)  07/17/13 121 lb 4.1 oz (55 kg)  07/12/13 140 lb 10.5 oz (63.8 kg)  11/22/12 141 lb 0.6 oz (63.975 kg)  11/01/12 141 lb 12.8 oz (64.32 kg)  01/25/12 145 lb 2 oz (65.828 kg)  12/02/11 143 lb (64.864 kg)    Usual Body Weight: 140 lbs  % Usual Body Weight: 69%  BMI:  Body mass index is 13.57 kg/(m^2).  Estimated Nutritional Needs: Kcal: 1550-1750 Protein: 50-60 grams Fluid: 1.6-1.8 L/day  Skin: intact  Diet Order: General  EDUCATION NEEDS: -No education needs identified at this time   Intake/Output Summary (Last 24 hours) at 10/16/13 1552 Last data filed at 10/16/13 1457  Gross per 24 hour  Intake 1498.33 ml  Output    200 ml  Net 1298.33 ml    Last BM: 11/23  Labs:   Recent Labs Lab 10/15/13 0352 10/15/13 0826 10/15/13 1535 10/16/13 0420  NA 136 137  --  138  K 5.9* 6.1* 5.8* 4.3  CL 107 100  --  104  CO2  --  16*  --  18*  BUN 43* 42*  --  43*  CREATININE 3.40* 3.16*  --  3.39*  CALCIUM  --  9.2  --  8.3*  MG  --   --   --  2.3  PHOS  --   --   --  5.4*  GLUCOSE 109* 88  --  87    CBG (last 3)  No results found for this basename: GLUCAP,  in the last 72 hours  Scheduled Meds: . aspirin EC  81 mg Oral Daily  . cefTRIAXone (ROCEPHIN)  IV  1 g Intravenous Q24H  . docusate sodium  100 mg Oral BID  . enoxaparin (LOVENOX)  injection  30 mg Subcutaneous Q24H  . food thickener  5 g Oral Daily  . guaiFENesin  600 mg Oral BID  . influenza vac split quadrivalent PF  0.5 mL Intramuscular Tomorrow-1000  . megestrol  400 mg Oral Daily  . metoprolol  50 mg Oral Daily  . mirtazapine  15 mg Oral QHS  . saccharomyces boulardii  250 mg Oral BID  . sennosides-docusate sodium  1 tablet Oral Daily  . sodium chloride  3 mL Intravenous Q12H    Continuous Infusions: . sodium chloride 50 mL/hr at 10/16/13 1914    Past Medical History  Diagnosis Date  . Osteoarthrosis, unspecified whether generalized or localized, other specified sites   . Acute kidney failure with other specified pathological lesion in kidney   . Prostatitis   . Postsurgical percutaneous transluminal coronary angioplasty status     weintraub  . Cataract extraction status `  . Peripheral vascular disease, unspecified   . Personal history of malignant neoplasm of larynx   . Elevated prostate specific antigen (PSA)   . Gout, unspecified   . Acute peptic ulcer with hemorrhage   . Unspecified essential hypertension   . Hyperlipidemia   . Diastolic heart failure   . Pulmonary edema   . FTT (failure to thrive) in adult   . Cancer     throat    Past Surgical History  Procedure Laterality Date  . Percutaneous translumional coronary angioplastty      stents to bilateral legs for PVD  . Cataract extraction    . Truncal vagotomy  06/2005  . Gastrojejunosotomy  06/2005  . Cholecystectomy  06/2005    Ian Malkin RD, LDN Inpatient Clinical Dietitian Pager: [phone] After Hours Pager: [phone]

## 2013-10-16 NOTE — Clinical Documentation Improvement (Signed)
THIS DOCUMENT IS NOT A PERMANENT PART OF THE MEDICAL RECORD  Please update your documentation with the medical record to reflect your response to this query. If you need help knowing how to do this please call [phone].  10/16/13  Dear Dr.M Debby Bud  In a better effort to capture your patient's severity of illness, reflect appropriate length of stay and utilization of resources, a review of the patient medical record has revealed the following indicators.    Based on your clinical judgment, please clarify and document in a progress note and/or discharge summary the clinical condition associated with the following supporting information:  In responding to this query please exercise your independent judgment.  The fact that a query is asked, does not imply that any particular answer is desired or expected.  10/16/13 Prog note  "2. ARI- chronic problem. He is not a candidate for HD. Condition exacerbated by dehydration 2/2 poor intake.".Marland KitchenMarland Kitchen Lab findings: 10/15/13: BUN 43, Creat 3.40, 10/16/13: BUN 43 Creat 3.39. Treatment: Cont trend Bmet. 0.9% NS infus @ 51ml/hr. For accurate Dx specificity & severity can noted "ARI" be further clarified with cond being mon'd, eval'd & tx'd.  Thank you  Possible Clinical Conditions?  Acute Renal Failure Acute Kidney Injury Acute Tubular Necrosis Acute Renal Cortical Necrosis Acute Renal Medullary Necrosis Acute on Chronic Renal Failure (Stage of CRF if known) Chronic Renal Failure (Stage of CRF if known) Anuria Oliguria Other Condition Cannot Clinically Determine   Supporting Information: Risk Factors: See above note  Signs and Symptoms: See above note  Lab: See above note  Treatments: See above note                  You may use possible, probable, or suspect with inpatient documentation. possible, probable, suspected diagnoses MUST be documented at the time of discharge  Reviewed: additional documentation in the MEDICAL RECORD NUMBER12/1/14: per  dc summ " Acute renal failure" documented in dc summ 10/18/13> cls'd. orm  Thank You,  Toribio Harbour, RN, BSN, CCDS Certified Clinical Documentation Specialist Pager: [phone] Health Information Management [place]

## 2013-10-17 DIAGNOSIS — N19 Unspecified kidney failure: Secondary | ICD-10-CM

## 2013-10-17 DIAGNOSIS — R634 Abnormal weight loss: Secondary | ICD-10-CM

## 2013-10-17 LAB — URINE CULTURE

## 2013-10-17 MED ORDER — CEFUROXIME AXETIL 250 MG PO TABS
250.0000 mg | ORAL_TABLET | Freq: Two times a day (BID) | ORAL | Status: DC
  Administered 2013-10-17 – 2013-10-18 (×2): 250 mg via ORAL
  Filled 2013-10-17 (×4): qty 1

## 2013-10-17 NOTE — Progress Notes (Signed)
Chaplain following spiritual care consult.  Upon visit today, pt asleep, grandson not present.  Will continue to follow and make contact with family for support.    Belva Crome MDiv

## 2013-10-17 NOTE — Progress Notes (Signed)
Palliative Medicine Team consult request received from Dr Debby Bud for hospice eligibility for [facility], per writer's discussion with Dr Debby Bud he has had ongoing conversation with patient who has had some intermittent mild confusion as well as patient's grandson Yasir Kitner [phone]) he wants to await Reggie's further discussion with patient regarding Dr Debby Bud' recommendation of [facility] before requesting [employer] and CSW to engage  Dr Debby Bud indicated no additional PMT needs other than to assist with facilitating hospice/[facility] eligibility - Dr Debby Bud plans to contact [facility] and [facility]  At this time PMT will sign off; please re-consult if the team can be of future assistance. Thank you   Valente David, RN 10/17/2013, 9:23 AM Palliative Medicine Team RN Liaison [phone]

## 2013-10-17 NOTE — Consult Note (Signed)
[facility] Liaison: Received request from CSW for possible transfer to [facility] if confirmed by Dr. Debby Bud once grandson discusses with patient. Also made aware of this patient by Palliative Medicine Team RN and received voice msg from Dr. Debby Bud. Clinical information reviewed by [facility] medical team. [facility] room available for Mr. Bulkley tomorrow 10/18/2013 if transfer desired by Mr. Walgren. Will follow up with Dr. Debby Bud and CSW in am. Thank you. Forrestine Him LCSW [phone]

## 2013-10-17 NOTE — Progress Notes (Signed)
Subjective: Awake and talkative. NO pain or discomfort. Not much appetite.  Objective: Lab:  Recent Labs  10/15/13 0340 10/15/13 0352 10/16/13 0420  WBC 8.1  --  7.0  NEUTROABS 6.5  --   --   HGB 11.2* 13.6 9.3*  HCT 33.4* 40.0 27.1*  MCV 93.0  --  92.2  PLT 319  --  243    Recent Labs  10/15/13 0352 10/15/13 0826 10/15/13 1535 10/16/13 0420  NA 136 137  --  138  K 5.9* 6.1* 5.8* 4.3  CL 107 100  --  104  GLUCOSE 109* 88  --  87  BUN 43* 42*  --  43*  CREATININE 3.40* 3.16*  --  3.39*  CALCIUM  --  9.2  --  8.3*  MG  --   --   --  2.3  PHOS  --   --   --  5.4*   Urine Cx - Enterobacter Cloacae - SS rocephin, cipro  Imaging:  Scheduled Meds: . aspirin EC  81 mg Oral Daily  . cefTRIAXone (ROCEPHIN)  IV  1 g Intravenous Q24H  . docusate sodium  100 mg Oral BID  . enoxaparin (LOVENOX) injection  30 mg Subcutaneous Q24H  . feeding supplement (ENSURE)  1 Container Oral BID PC  . food thickener  5 g Oral Daily  . guaiFENesin  600 mg Oral BID  . influenza vac split quadrivalent PF  0.5 mL Intramuscular Tomorrow-1000  . megestrol  400 mg Oral Daily  . metoprolol  50 mg Oral Daily  . mirtazapine  15 mg Oral QHS  . multivitamin with minerals  1 tablet Oral Daily  . saccharomyces boulardii  250 mg Oral BID  . sennosides-docusate sodium  1 tablet Oral Daily  . sodium chloride  3 mL Intravenous Q12H   Continuous Infusions: . sodium chloride 50 mL/hr at 10/17/13 0102   PRN Meds:.acetaminophen, acetaminophen, albuterol, HYDROcodone-acetaminophen, ondansetron (ZOFRAN) IV, ondansetron   Physical Exam: Filed Vitals:   10/17/13 0500  BP: 101/68  Pulse: 78  Temp: 97.9 F (36.6 C)  Resp: 17   Wt Readings from Last 3 Encounters:  10/15/13 97 lb 3.6 oz (44.1 kg)  09/15/13 105 lb (47.628 kg)  09/13/13 105 lb 6.1 oz (47.8 kg)   Gen'l- cachetic man in no distress Cor 1+ radial pulse, heart sounds distant but regular PUlm - normal respirations w/o increased WOB, no  rales Abd- scaphoid, non-tender Neuro - awake, speech is clear. Memory is poor.      Assessment/Plan: 1. ID - urine culture reviewed. He is afebrile and hemodynamically stable Plan D/c Rocephin  Ceftin 250 mg bid  2. Acute on chronic renal failure (CKD 4)- 2/2 ureteral obstruction from enlarged prostate. Not a surgical candidate. Creatinine remains stable and he has been adequately hydrated.  3. CHF history of diastolic dysfunction and previous episodes of failure. Currently stable w/o signs of decompensation.  4. Hyperkalemia- resolved  5. FTT/severe protein calorie malnutriton. He has had no appetite. Have spoken with him about palliative care and hospice. Have spoken with Reggie - grandson and primary care-giver. He has spoken with hospice. At this time  With his continued decline, per Reggie Mr. Schaner has stated many times that he is ready to die, with a very poor prognosis he is a candidate for hospice and [facility]. Reggie will speak with him today. Will request consult for eligibility for hospice and [facility]. Will ask chaplain to see him.  Illene [facility]ulus [place] [facility] [phone]; (c) [phone] Call-grp - Patsi Sears IM  Tele: [phone]  10/17/2013, 7:36 AM

## 2013-10-17 NOTE — Progress Notes (Signed)
Clinical Social Work Department BRIEF PSYCHOSOCIAL ASSESSMENT 10/17/2013  Patient:  Wesley [name], Wesley [name]     Account Number:  [id number]     Admit date:  10/15/2013  Clinical Social Worker:  Jacelyn Grip  Date/Time:  10/17/2013 11:25 AM  Referred by:  Physician  Date Referred:  10/17/2013 Referred for  SNF Placement   Other Referral:   patient admitted from [facility] Re[facility]   Interview type:  Other - See comment Other interview type:   chart review and discussion with [facility] and Rehab    PSYCHOSOCIAL DATA Living Status:  FACILITY Admitted from facility:  [facility] Level of care:  Skilled Nursing Facility Primary support name:  Wesley [name]/grandson Primary support relationship to patient:  FAMILY Degree of support available:   appears adequate    CURRENT CONCERNS Current Concerns  Post-Acute Placement   Other Concerns:    SOCIAL WORK ASSESSMENT / PLAN CSW received referral that pt admitted from [facility] and Rehab.    CSW reviewed chart and noted that Per PMT RN liaison note, Dr Debby Bud has had ongoing conversation with patient who has had some intermittent mild confusion as well as patient's grandson Wesley [name] [phone]) he wants to await Wesley's further discussion with patient regarding Dr Debby Bud' recommendation of [facility] before requesting [employer] and CSW to engage.    CSW spoke with [place] who confirmed that pt is a resident of their facility. Lacinda Axon discussed that facility has been having ongoing conversations with pt and pt family re: hospice and palliative care and as Dr. Debby Bud note has also indicated, pt and pt family have not yet been agreeable to full comfort care, but only palliative care services. Lacinda Axon states that if pt determines that they are not yet ready for residential hospice care then facility would be willing to accept pt back with Palliative Care Services to follow.     CSW will continue to follow and engage with pt and pt family at Dr. Debby Bud request to assist with residential hospice placement or transition back to [place] depending on pt and pt family decision.    CSW to continue to follow.   Assessment/plan status:  Psychosocial Support/Ongoing Assessment of Needs Other assessment/ plan:   discharge planning   Information/referral to community resources:   Discussed with [facility] and Rehab    PATIENT'S/FAMILY'S RESPONSE TO PLAN OF CARE: CSW did not engage with family at this time as Dr. Debby Bud requested to allow pt grandson time to speak with pt regarding hospice care and MD recommenation for [facility].    [facility] and Rehab appears supportive of pt and has had similar goals of care conversations at [facility].     Jacklynn Lewis, MSW, LCSWA  Clinical Social Work Coverage for [employer], [place] [phone]

## 2013-10-18 ENCOUNTER — Telehealth: Payer: Self-pay

## 2013-10-18 DIAGNOSIS — R972 Elevated prostate specific antigen [PSA]: Secondary | ICD-10-CM

## 2013-10-18 NOTE — Telephone Encounter (Signed)
Phone call to patient and left a message letting him know he is due for his Prevnar vaccine. 

## 2013-10-18 NOTE — Consult Note (Signed)
[facility] Liaison: Spoke with CSW's Suzanna and Tresa Endo earlier this am before and after confirming with Dr. Debby Bud plan to transfer to [facility] today. Appreciate help from CSW [place] making contact with Reggie and plan to complete paperwork at noon which is earliest Reggie is available. Will need DC summary faxed to [phone] and RN to call report to [phone]. Thank you. Forrestine Him LCSW [phone]

## 2013-10-18 NOTE — Progress Notes (Signed)
Report given to Dennie Bible, Charity fundraiser at [facility].

## 2013-10-18 NOTE — Progress Notes (Signed)
Pt's BP is 70/38 manually, HR 69. MD contacted and made aware of findings. Pt alert and watching television at the time with no complaints. MD states he is on the way to see pt. Will continue to monitor pt.   Arta Bruce [facility] 10/18/2013 12:00 PM

## 2013-10-18 NOTE — Discharge Summary (Signed)
NAMEROMEN, YUTZY NO.:  [id number]  MEDICAL RECORD NO.:  [id number]  LOCATION:  1434                         FACILITY:  [facility]  PHYSICIAN:  Rosalyn Gess. Nahia Nissan, MD  DATE OF BIRTH:  1920/05/03  DATE OF ADMISSION:  10/15/2013 DATE OF DISCHARGE:  10/18/2013                              DISCHARGE SUMMARY   ADMITTING DIAGNOSES: 1. Acute renal failure in the setting of chronic renal insufficiency. 2. Urinary tract infection. 3. Benign prostatic hypertrophy with obstruction of the lower urinary     tract with chronic Foley catheter. 4. Severe calorie protein malnutrition and failure to thrive. 5. History of congestive heart failure. 6. Hyperkalemia.  DISCHARGE DIAGNOSES: 1. Acute renal failure in the setting of chronic renal insufficiency. 2. Urinary tract infection. 3. Benign prostatic hypertrophy with obstruction of the lower urinary     tract with chronic Foley catheter. 4. Severe calorie protein malnutrition and failure to thrive. 5. History of congestive heart failure. 6. Hyperkalemia, resolved.  CONSULTANTS:  None.  PROCEDURES:  Chest x-ray at admission which showed emphysema and scarring.  No evidence of acute superimposed disease.  HISTORY OF PRESENT ILLNESS:  Mr. Pullman is a 79 year old gentleman with a past medical history of osteoarthrosis, acute kidney failure secondary to ureteral obstruction, history of severe BPH, history of CAD status post transluminal coronary angioplasty, history of cataract extraction, history of peripheral vascular disease, history of cancer of the larynx, history of gout, history of acute peptic ulcer disease with hemorrhage, history of hyperlipidemia, history of diastolic heart failure, history of pulmonary edema.  The patient was brought from [facility] facility because of increased confusion.  On presentation to the emergency department, he was found to have a significant urinary tract infection. He was also noted to  have exacerbation of his known renal insufficiency with a creatinine elevated above 3 and a potassium that was greater than 6.  The patient was given Kayexalate.  He was admitted to hospital for antibiotic therapy and hydration.  Please see the H and P for past medical history, family history, social history, and admission physical exam.  HOSPITAL COURSE: 1. ID.  The patient did have a positive urinalysis at admission.  The     patient did have a positive urine culture with Enterobacter cloacae     that was sensitive to ceftriaxone.  The patient was treated     initially with IV antibiotics, but was then converted to oral     antibiotic to complete his course of therapy. 2. Acute on chronic renal failure, CKD IV.  The patient has known     ureteral obstruction from a massively enlarged prostate.  This was     a problem several months ago with initial presentation of acute     renal failure.  The patient did have a markedly elevated PSA, but     has not had further evaluation given his 79 years old.  The patient     was not a candidate for surgery and therefore has had an indwelling     Foley catheter to relieve the postobstructive nephropathy.  He has     done relatively well, has remained  relatively stable in this regard     with last creatinine of 3.4. 3. Diastolic heart failure.  The patient does have a history of     diastolic heart failure as established by 2D echo.  He has been     carefully managed and has no evidence of acute decompensation in     regards to heart failure. 4. Hyperkalemia.  The patient was treated with Kayexalate with good     response.  Final potassium being 4.3 on November 24. 5. Severe protein calorie malnutrition and failure to thrive.  The     patient has had a significant declining course over the past year     with multiple hospitalizations.  He has become increasingly weak     and was unable to live on his own and has been in an extended care      facility.  The patient has continued to have a very poor appetite     with continued weight loss.  The patient has stated on     multiple occasions that he feels like he is dying and that he is     ready to die.  During this hospitalization, I had many     conversations with the patient about end of life care and comfort     care.  The patient does have some memory problems and insight     problems.  Did talk with his grandson Reggie, who is his primary     caregiver who had initially brought up the idea of hospice and is     very supportive of hospice care for this patient.  At this point     with a very, very poor prognosis, the patient has been accepted to     [facility] for end of life care.  DISCHARGE PHYSICAL EXAMINATION:  VITAL SIGNS:  Temperature was 97.7, blood pressure has been dropping to 70/38, heart rate was 69, respirations 18, oxygen saturations 95%. GENERAL APPEARANCE:  This is a very cachectic elderly African American gentleman, who is in no acute distress, who is cogniting and able to speak.  He is able to take a diet which he has been heating for pleasure even though he has a known risk of aspiration. HEENT:  The patient has got temporal wasting.  Conjunctivae and sclerae are muddy.  He has bilateral arcus senilis.  The patient is edentulous. He has an upper denture only with no oral lesions.  Nose: There is no adenopathy in the submandibular or cervical regions NECK:  Very thin.  There is no thyromegaly. CARDIOVASCULAR:  The patient has 1+ radial pulses.  Precordium is quiet. His heart sounds are very distant, but appear regular.  He has a 2/6 systolic murmur. PULMONARY:  The patient has no increased work of breathing, but very shallow respirations.  He has no rales or wheezes on examination. ABDOMEN:  Scaphoid.  He does have scattered positive bowel sounds.  He has no guarding or rebound or tenderness on exam. GENITALIA:  Deferred. EXTREMITIES:  Without  clubbing, cyanosis, or edema.  He does have interosseous wasting. NEURO:  The patient is conversational.  He does recognize the examiner. He is aware of the plans for him to transfer to [facility] and is good with this.  He has no focal neurologic findings.  LABORATORY DATA:  Final laboratory from November 24, sodium was 138, potassium 4.3, chloride 104, CO2 of 18, BUN 43, creatinine 3.39, calcium 8.3.  GFR estimated  to be 70 mL/minute.  Glucose was 87.  Phosphorus was 5.4, magnesium 2.3, albumin 2.5 and low.  AST and ALT are normal.  Pre- albumin is low at 7.7, cardiac enzymes this admission were negative. CBC from October 16, 2013, with a white count of 7000, hemoglobin of 9.33 g.  Platelet count 243,000.  TSH was normal at 3.24.  DISCHARGE MEDICATIONS: 1. Tylenol 650 mg q.6 p.r.n. 2. Amlodipine 5 mg daily to be held for a blood pressure less than     100/70. 3. Furosemide 40 mg daily to be held for blood pressure less than     100/70. 4. Boost to be taken daily between meals. 5. Megace 400 mg per 10 mL, 10 mL is once daily for appetite. 6. Mirtazapine 15 mg at bedtime. 7. Zofran 4 mg tab 1 p.o. q.6 p.r.n. nausea. 8. Senokot 1 tab by mouth daily as needed for constipation.  The patient may follow all [employer].  The patient is a DNR/DNI with no plans to return to hospital.  Dr. Debby Bud will be attending a [facility].  The patient's condition at time of discharge dictation is very poor with a poor prognosis due to multiple medical problems.       Rosalyn Gess Hani Patnode, MD     MEN/MEDQ  D:  10/18/2013  T:  10/18/2013  Job:  [id number]

## 2013-10-18 NOTE — Progress Notes (Signed)
Subjective: Overnight, no acute events. Mr. Meno says he is doing well this morning and in no pain. He is breathing without difficulty. He says his appetite is "good," but he still does not have his dentures.   Objective: Lab:  Recent Labs  10/16/13 0420  WBC 7.0  HGB 9.3*  HCT 27.1*  MCV 92.2  PLT 243    Recent Labs  10/15/13 1535 10/16/13 0420  NA  --  138  K 5.8* 4.3  CL  --  104  GLUCOSE  --  87  BUN  --  43*  CREATININE  --  3.39*  CALCIUM  --  8.3*  MG  --  2.3  PHOS  --  5.4*    Imaging:  Scheduled Meds: . aspirin EC  81 mg Oral Daily  . cefUROXime  250 mg Oral BID WC  . docusate sodium  100 mg Oral BID  . enoxaparin (LOVENOX) injection  30 mg Subcutaneous Q24H  . feeding supplement (ENSURE)  1 Container Oral BID PC  . food thickener  5 g Oral Daily  . guaiFENesin  600 mg Oral BID  . influenza vac split quadrivalent PF  0.5 mL Intramuscular Tomorrow-1000  . megestrol  400 mg Oral Daily  . metoprolol  50 mg Oral Daily  . mirtazapine  15 mg Oral QHS  . multivitamin with minerals  1 tablet Oral Daily  . saccharomyces boulardii  250 mg Oral BID  . sennosides-docusate sodium  1 tablet Oral Daily  . sodium chloride  3 mL Intravenous Q12H   Continuous Infusions: . sodium chloride 50 mL/hr at 10/17/13 1833   PRN Meds:.acetaminophen, acetaminophen, albuterol, HYDROcodone-acetaminophen, ondansetron (ZOFRAN) IV, ondansetron   Physical Exam: Filed Vitals:   10/18/13 0505  BP: 83/48  Pulse: 87  Temp: 97.7 F (36.5 C)  Resp: 18    General: Cachetic man in no apparent distress. Positive affect.  CV: Regular rate and rhythm. Hearts sounds are distant. No murmurs, rubs or gallops. 1+ Dorsalis pedis pulse bilaterally. No edema in lower extremities.  Pulm: Normal work of breathing. No crackles, rales or rhonci.  Abdomen: Soft, non-tender and non-distended.  Neuro: AOx3. Cranial nerves grossly intact. EOM intact without nystagmus. Pupils equal and reactive  bilaterally.       Assessment/Plan: Mr. Keysor is a 79 year old male with PMH of osteoarthritis, laryngeal carcinoma, failure to thrive, diastolic heart failure, and essential hypertension who presented with acute on chronic renal failure and UTI.   1) Urinary tract infection: Mr. Greenfeld has remained afebrile and is currently asymptomatic. He is hemodynamically stable. He will d/c on ceftin 250 mg  2) Acute on chronic renal failure (CKD 4): His CKD is due to ureteral obstruction from an enlarged prostate. His creatinine continues to remain stable around 3.39. He is having improved PO intake to maintain his hydration status.   3) Diastolic CHF: Mr. Villwock currently is asymptomatic and has no signs of decompensation. Continue to monitor.   4) Hyperkalemia: Resolved.   5) Failure to thrive: Mr. Krone appetite is improving. Dr. Debby Bud has discussed with Reggie (Mr. Tendler grandson and caretaker) regarding hospice. Mr. Teutsch will likely be discharged to [facility] today for comfort care measures.   Agree with above note.  Patient for transfer to [facility] for end of life care. Priority d/c dictation # [phone]  Sheppard Plumber [place] [facility] ([phone]; (c) [phone] Call-grp - Patsi Sears IM  Tele: [phone]  10/18/2013, 8:45 AM

## 2013-10-18 NOTE — Progress Notes (Signed)
Pt for discharge to [facility].  CSW facilitated pt discharge needs including discussing with [facility] liaison, Forrestine Him, faxing pt discharge information, discussing with RN, and arranging ambulance transportation for pt to [facility].   No further social work needs identified at this time.   CSW signing off.   Jacklynn Lewis, MSW, LCSWA  Clinical Social Work [phone]

## 2013-10-20 ENCOUNTER — Non-Acute Institutional Stay (INDEPENDENT_AMBULATORY_CARE_PROVIDER_SITE_OTHER): Payer: Medicare Other | Admitting: Internal Medicine

## 2013-10-20 DIAGNOSIS — N138 Other obstructive and reflux uropathy: Secondary | ICD-10-CM

## 2013-10-20 DIAGNOSIS — I509 Heart failure, unspecified: Secondary | ICD-10-CM

## 2013-10-20 DIAGNOSIS — N401 Enlarged prostate with lower urinary tract symptoms: Secondary | ICD-10-CM

## 2013-10-20 DIAGNOSIS — E43 Unspecified severe protein-calorie malnutrition: Secondary | ICD-10-CM

## 2013-10-20 DIAGNOSIS — N19 Unspecified kidney failure: Secondary | ICD-10-CM

## 2013-10-20 NOTE — Assessment & Plan Note (Signed)
Compensated at time of admission to [facility]

## 2013-10-20 NOTE — Assessment & Plan Note (Signed)
Continued need for foley.

## 2013-10-20 NOTE — Assessment & Plan Note (Signed)
CKD 4 stable creatinine at last lab in hospital prior to admission to [facility]

## 2013-10-20 NOTE — Progress Notes (Signed)
   Subjective:    Patient ID: Wesley [name], male    DOB: January 28, 1920, 79 y.o.   MRN: [id number]  HPI Patient admitted to [facility] - residential hospice  See scanned admit note - handwritten   Review of Systems     Objective:   Physical Exam        Assessment & Plan:

## 2013-10-20 NOTE — Assessment & Plan Note (Signed)
FTT. End of life care - admitted to [facility]

## 2013-10-24 ENCOUNTER — Telehealth: Payer: Self-pay

## 2013-10-24 MED ORDER — OXYCODONE-ACETAMINOPHEN 10-325 MG PO TABS: 1.0000 | ORAL_TABLET | Freq: Four times a day (QID) | ORAL | Status: AC | PRN

## 2013-10-24 NOTE — Telephone Encounter (Signed)
(  see previous message) will you please clarify percocet order or did you mean Hydrocodone??

## 2013-10-24 NOTE — Telephone Encounter (Signed)
Script will be faxed to [facility] [phone]

## 2013-10-24 NOTE — Telephone Encounter (Signed)
Phone call from Kathleen Lime, nurse at [facility], [phone] states patient is experiencing lots of pain in his feet. Patient states he wants his feet cut off they hurt so bad. Requesting and order for pain med. Please advise.

## 2013-10-24 NOTE — Telephone Encounter (Signed)
Gabapentin 300 mg tid. Also, percocet 1/325 q 6 as needed.

## 2013-10-24 NOTE — Telephone Encounter (Signed)
Phone call to [facility] and spoke to [place] letting her know MD orders.

## 2013-10-27 ENCOUNTER — Telehealth: Payer: Self-pay

## 2013-10-27 NOTE — Telephone Encounter (Signed)
I spoke to [facility] and gave her MD order.

## 2013-10-27 NOTE — Telephone Encounter (Signed)
Phone call from [facility] [phone] stating patient is becoming increasingly confused and belligerent. Trying to get out of bed. Requesting a medication. Please advise.

## 2013-10-27 NOTE — Telephone Encounter (Signed)
Haloperidol 1 mg q 6 prn agitation

## 2013-11-23 DEATH — deceased

## 2013-12-07 ENCOUNTER — Telehealth: Payer: Self-pay

## 2013-12-07 NOTE — Telephone Encounter (Signed)
Patient past away @ [facility] per Iver Nestle in [place]

## 2014-03-04 IMAGING — CR DG CHEST 2V
3 series · 3 of 3 positions shown · non-contrast
Comparison: 07/14/2013

CLINICAL DATA: Cough and weakness

CHEST - 2 VIEW

[w chest lat]
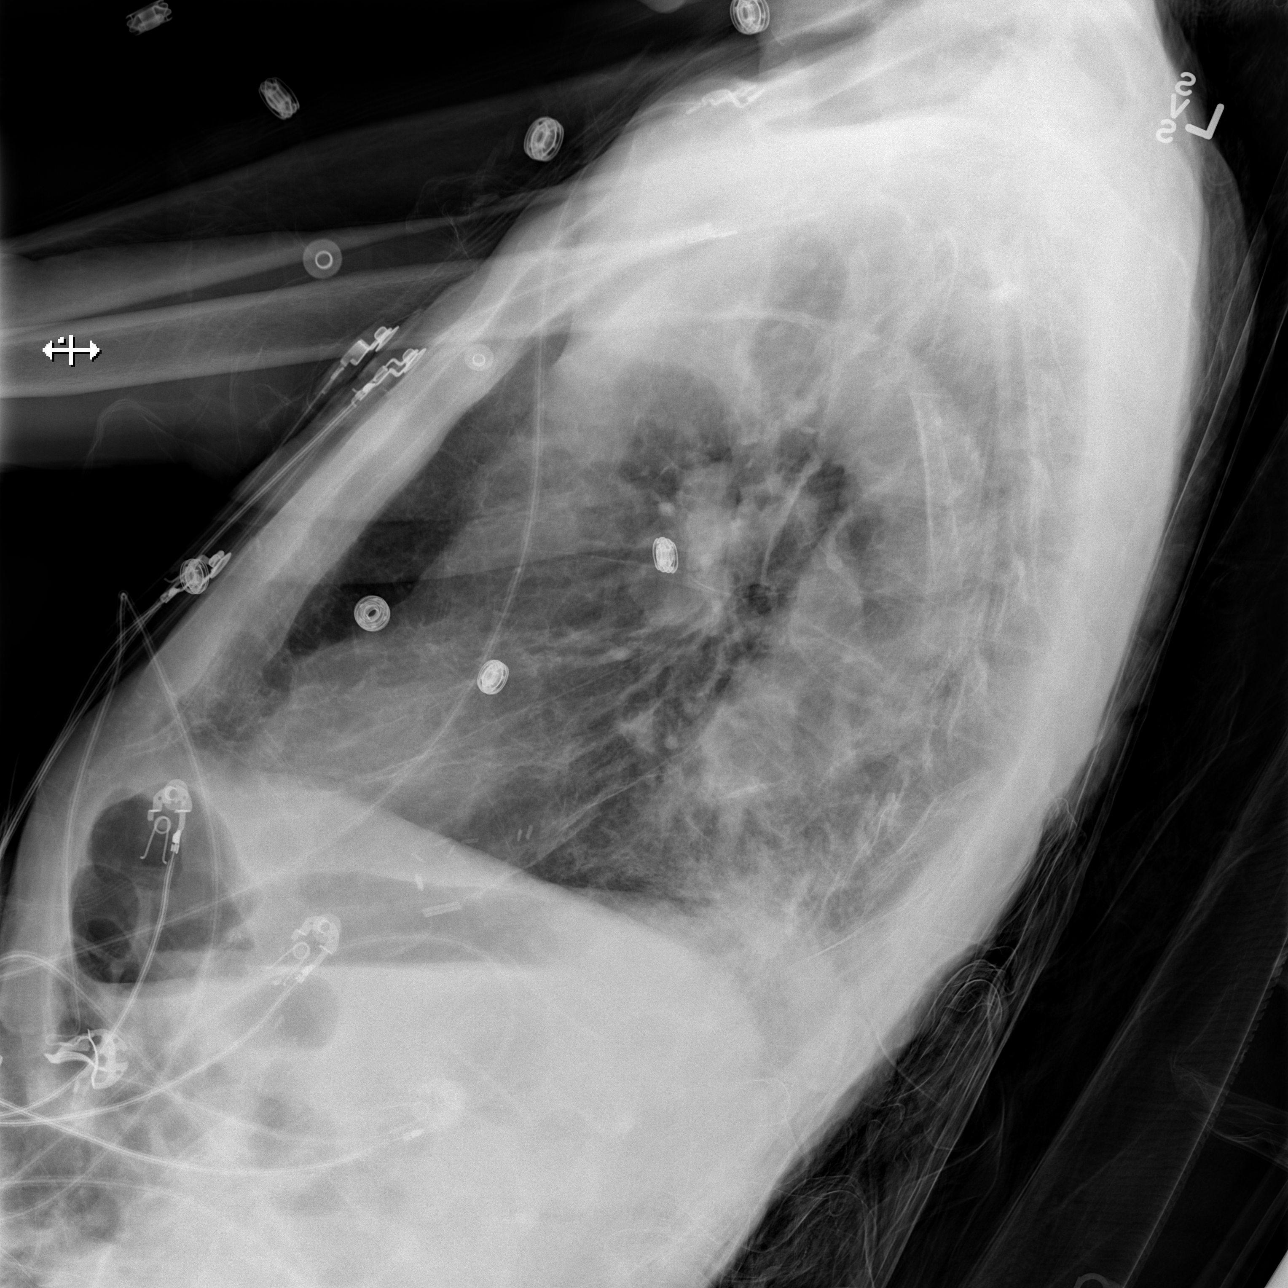

[x chest ap (1 of 2)]
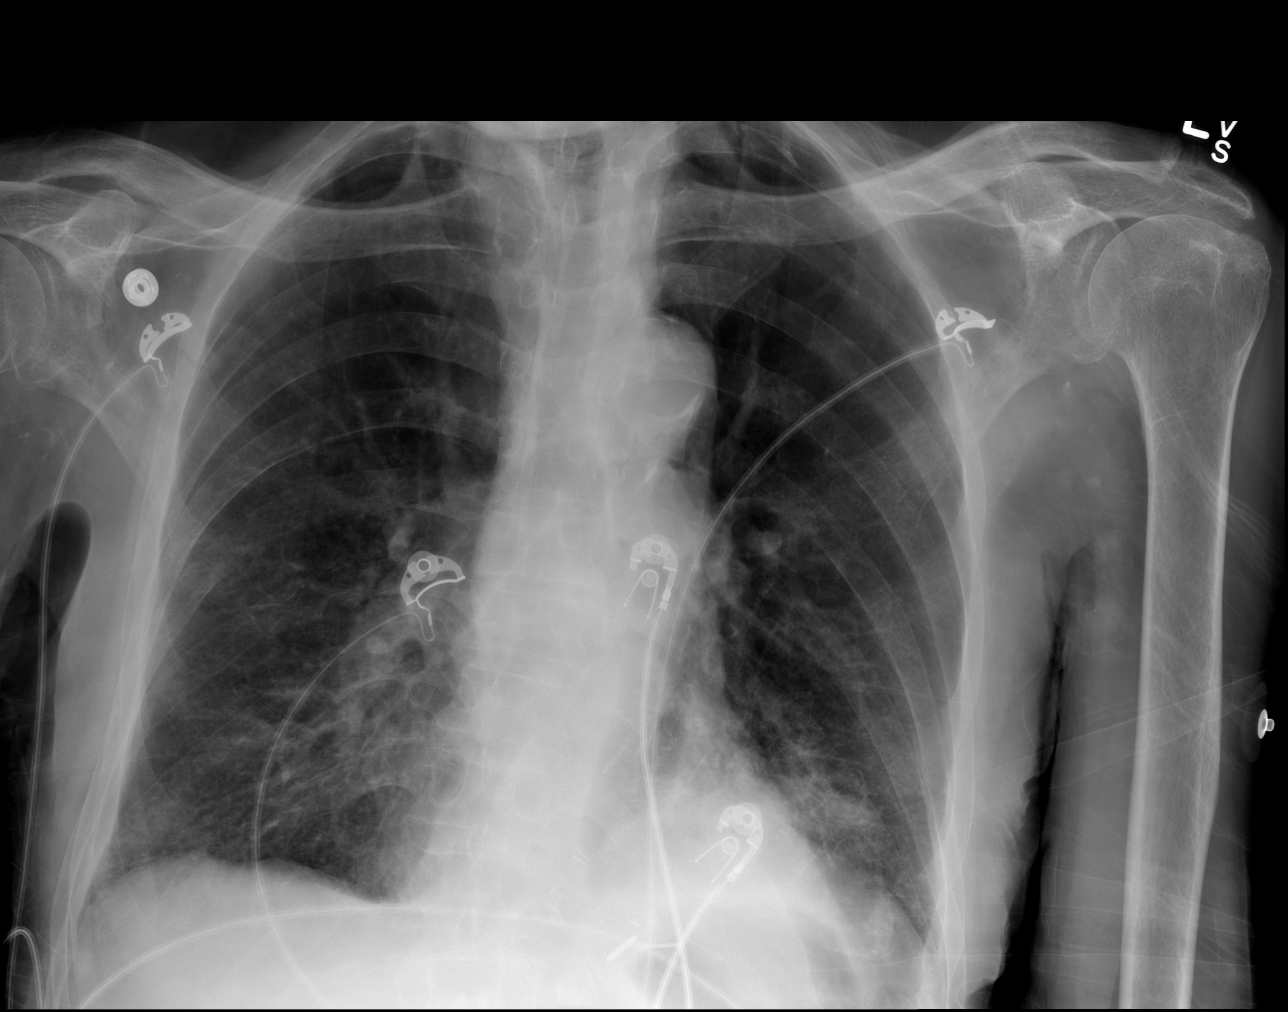

[x chest ap (2 of 2)]
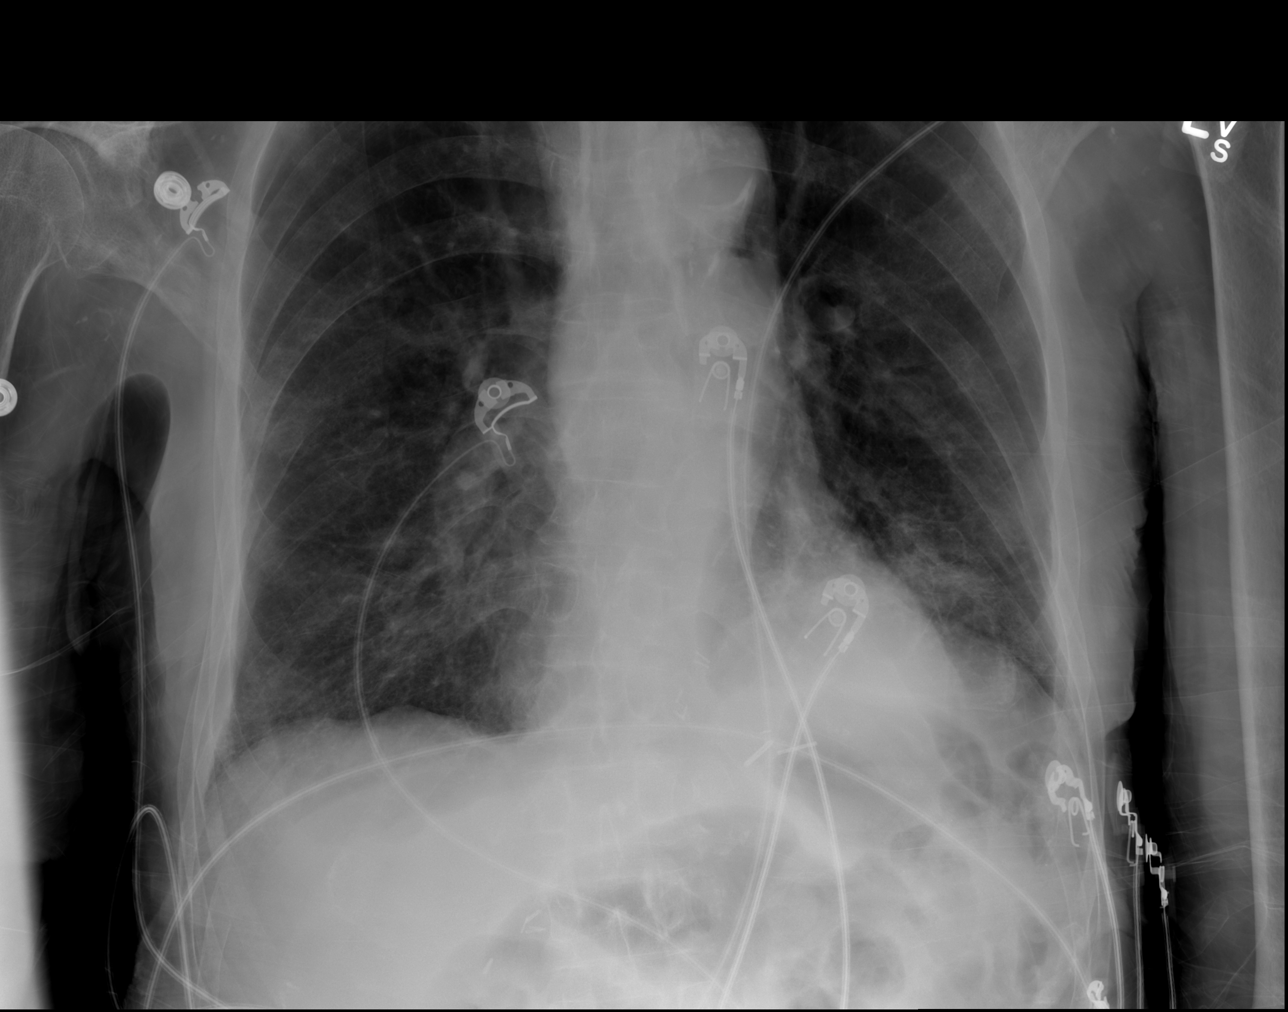

[3 of 3 positions shown; findings below may reference images not displayed]

FINDINGS: Normal heart size.  Emphysematous changes are present.
Bilateral pleural effusions have significantly decreased since CT
study from 07/14/2013, with trace residual pleural effusions
suspected. Small residual left basilar opacity.  No pulmonary
edema.  Right lung is clear.  Bones osteopenic.
IMPRESSION: 1.  Bilateral pleural effusions have significantly decreased/nearly
resolved since chest radiograph of 07/14/2013.
[DATE].  Small residual left basilar opacity could reflect airspace
disease or atelectasis.
